# Patient Record
Sex: Female | Born: 1935 | Race: White | Hispanic: No | Marital: Married | Smoking: Never smoker
Health system: Southern US, Community
[De-identification: ages and names within clinical notes are randomized; demographics above are authoritative.]

## PROBLEM LIST (undated history)

## (undated) DIAGNOSIS — E875 Hyperkalemia: Secondary | ICD-10-CM

## (undated) DIAGNOSIS — K140 Glossitis: Secondary | ICD-10-CM

## (undated) DIAGNOSIS — M8580 Other specified disorders of bone density and structure, unspecified site: Secondary | ICD-10-CM

## (undated) DIAGNOSIS — G629 Polyneuropathy, unspecified: Secondary | ICD-10-CM

## (undated) DIAGNOSIS — E785 Hyperlipidemia, unspecified: Secondary | ICD-10-CM

## (undated) DIAGNOSIS — R609 Edema, unspecified: Secondary | ICD-10-CM

## (undated) DIAGNOSIS — Z0000 Encounter for general adult medical examination without abnormal findings: Secondary | ICD-10-CM

## (undated) DIAGNOSIS — D649 Anemia, unspecified: Secondary | ICD-10-CM

## (undated) DIAGNOSIS — M79605 Pain in left leg: Secondary | ICD-10-CM

## (undated) DIAGNOSIS — R8281 Pyuria: Secondary | ICD-10-CM

## (undated) DIAGNOSIS — M79673 Pain in unspecified foot: Secondary | ICD-10-CM

## (undated) DIAGNOSIS — E119 Type 2 diabetes mellitus without complications: Secondary | ICD-10-CM

## (undated) DIAGNOSIS — L309 Dermatitis, unspecified: Secondary | ICD-10-CM

## (undated) DIAGNOSIS — Z79899 Other long term (current) drug therapy: Secondary | ICD-10-CM

## (undated) DIAGNOSIS — K5730 Diverticulosis of large intestine without perforation or abscess without bleeding: Secondary | ICD-10-CM

## (undated) DIAGNOSIS — N189 Chronic kidney disease, unspecified: Secondary | ICD-10-CM

## (undated) DIAGNOSIS — I10 Essential (primary) hypertension: Secondary | ICD-10-CM

## (undated) DIAGNOSIS — D509 Iron deficiency anemia, unspecified: Secondary | ICD-10-CM

## (undated) DIAGNOSIS — N390 Urinary tract infection, site not specified: Secondary | ICD-10-CM

## (undated) DIAGNOSIS — K219 Gastro-esophageal reflux disease without esophagitis: Secondary | ICD-10-CM

## (undated) DIAGNOSIS — I2510 Atherosclerotic heart disease of native coronary artery without angina pectoris: Secondary | ICD-10-CM

## (undated) HISTORY — DX: Other long term (current) drug therapy: Z79.899

## (undated) HISTORY — DX: Pyuria: R82.81

## (undated) HISTORY — DX: Pain in unspecified foot: M79.673

## (undated) HISTORY — DX: Other specified disorders of bone density and structure, unspecified site: M85.80

## (undated) HISTORY — DX: Type 2 diabetes mellitus without complications: E11.9

## (undated) HISTORY — PX: PTCA: SHX146

## (undated) HISTORY — DX: Hyperlipidemia, unspecified: E78.5

## (undated) HISTORY — DX: Pain in left leg: M79.605

## (undated) HISTORY — DX: Chronic kidney disease, unspecified: N18.9

## (undated) HISTORY — DX: Anemia, unspecified: D64.9

## (undated) HISTORY — DX: Hyperkalemia: E87.5

## (undated) HISTORY — DX: Urinary tract infection, site not specified: N39.0

## (undated) HISTORY — DX: Atherosclerotic heart disease of native coronary artery without angina pectoris: I25.10

## (undated) HISTORY — DX: Polyneuropathy, unspecified: G62.9

## (undated) HISTORY — DX: Edema, unspecified: R60.9

## (undated) HISTORY — DX: Essential (primary) hypertension: I10

## (undated) HISTORY — DX: Encounter for general adult medical examination without abnormal findings: Z00.00

## (undated) HISTORY — DX: Glossitis: K14.0

## (undated) HISTORY — DX: Iron deficiency anemia, unspecified: D50.9

## (undated) HISTORY — DX: Dermatitis, unspecified: L30.9

## (undated) HISTORY — DX: Diverticulosis of large intestine without perforation or abscess without bleeding: K57.30

## (undated) HISTORY — DX: Gastro-esophageal reflux disease without esophagitis: K21.9

## (undated) HISTORY — PX: CHOLECYSTECTOMY: SHX55

---

## 1967-04-30 HISTORY — PX: TOTAL ABDOMINAL HYSTERECTOMY: SHX209

## 1999-05-13 ENCOUNTER — Emergency Department (HOSPITAL_COMMUNITY): Admission: EM | Admit: 1999-05-13 | Discharge: 1999-05-13 | Payer: Self-pay | Admitting: Emergency Medicine

## 2001-12-10 ENCOUNTER — Ambulatory Visit (HOSPITAL_COMMUNITY): Admission: RE | Admit: 2001-12-10 | Discharge: 2001-12-10 | Payer: Self-pay | Admitting: Internal Medicine

## 2003-10-18 ENCOUNTER — Emergency Department (HOSPITAL_COMMUNITY): Admission: EM | Admit: 2003-10-18 | Discharge: 2003-10-18 | Payer: Self-pay | Admitting: Emergency Medicine

## 2004-03-14 ENCOUNTER — Ambulatory Visit: Payer: Self-pay | Admitting: Internal Medicine

## 2004-06-12 ENCOUNTER — Ambulatory Visit: Payer: Self-pay | Admitting: Internal Medicine

## 2004-06-26 ENCOUNTER — Emergency Department (HOSPITAL_COMMUNITY): Admission: EM | Admit: 2004-06-26 | Discharge: 2004-06-26 | Payer: Self-pay | Admitting: Emergency Medicine

## 2004-06-28 ENCOUNTER — Ambulatory Visit: Payer: Self-pay | Admitting: Internal Medicine

## 2004-09-17 ENCOUNTER — Ambulatory Visit: Payer: Self-pay | Admitting: Internal Medicine

## 2004-09-21 ENCOUNTER — Ambulatory Visit: Payer: Self-pay

## 2004-11-07 ENCOUNTER — Ambulatory Visit: Payer: Self-pay | Admitting: Pulmonary Disease

## 2004-12-26 ENCOUNTER — Ambulatory Visit: Payer: Self-pay | Admitting: Internal Medicine

## 2005-01-04 ENCOUNTER — Ambulatory Visit: Payer: Self-pay | Admitting: Endocrinology

## 2005-02-22 ENCOUNTER — Ambulatory Visit: Payer: Self-pay | Admitting: Endocrinology

## 2005-03-25 ENCOUNTER — Ambulatory Visit: Payer: Self-pay | Admitting: Endocrinology

## 2005-04-01 ENCOUNTER — Observation Stay (HOSPITAL_COMMUNITY): Admission: EM | Admit: 2005-04-01 | Discharge: 2005-04-02 | Payer: Self-pay | Admitting: Emergency Medicine

## 2005-04-01 ENCOUNTER — Ambulatory Visit: Payer: Self-pay | Admitting: Endocrinology

## 2005-04-01 ENCOUNTER — Ambulatory Visit: Payer: Self-pay | Admitting: Cardiology

## 2005-04-08 ENCOUNTER — Ambulatory Visit: Payer: Self-pay | Admitting: Cardiology

## 2005-05-03 ENCOUNTER — Ambulatory Visit: Payer: Self-pay | Admitting: Internal Medicine

## 2005-05-10 ENCOUNTER — Ambulatory Visit: Payer: Self-pay | Admitting: Internal Medicine

## 2005-05-13 ENCOUNTER — Ambulatory Visit: Payer: Self-pay | Admitting: Cardiology

## 2005-05-13 ENCOUNTER — Inpatient Hospital Stay (HOSPITAL_COMMUNITY): Admission: EM | Admit: 2005-05-13 | Discharge: 2005-05-14 | Payer: Self-pay | Admitting: Emergency Medicine

## 2005-06-06 ENCOUNTER — Ambulatory Visit: Payer: Self-pay | Admitting: Internal Medicine

## 2005-06-27 ENCOUNTER — Ambulatory Visit: Payer: Self-pay | Admitting: Endocrinology

## 2005-07-16 ENCOUNTER — Ambulatory Visit: Payer: Self-pay | Admitting: Endocrinology

## 2005-08-01 ENCOUNTER — Ambulatory Visit: Payer: Self-pay | Admitting: Cardiology

## 2005-08-01 ENCOUNTER — Ambulatory Visit: Payer: Self-pay | Admitting: Endocrinology

## 2005-08-05 ENCOUNTER — Ambulatory Visit: Payer: Self-pay | Admitting: Internal Medicine

## 2005-08-19 ENCOUNTER — Ambulatory Visit: Payer: Self-pay | Admitting: Internal Medicine

## 2005-08-23 ENCOUNTER — Ambulatory Visit: Payer: Self-pay | Admitting: Internal Medicine

## 2005-09-27 ENCOUNTER — Ambulatory Visit: Payer: Self-pay | Admitting: Endocrinology

## 2005-10-02 ENCOUNTER — Ambulatory Visit: Payer: Self-pay | Admitting: Internal Medicine

## 2005-12-20 ENCOUNTER — Ambulatory Visit: Payer: Self-pay | Admitting: Endocrinology

## 2005-12-27 ENCOUNTER — Ambulatory Visit: Payer: Self-pay | Admitting: Endocrinology

## 2006-02-12 ENCOUNTER — Ambulatory Visit: Payer: Self-pay | Admitting: Internal Medicine

## 2006-02-24 ENCOUNTER — Ambulatory Visit: Payer: Self-pay

## 2006-02-24 HISTORY — PX: CARDIOVASCULAR STRESS TEST: SHX262

## 2006-02-28 ENCOUNTER — Ambulatory Visit: Payer: Self-pay | Admitting: Endocrinology

## 2006-03-24 ENCOUNTER — Ambulatory Visit: Payer: Self-pay | Admitting: Endocrinology

## 2006-03-24 LAB — CONVERTED CEMR LAB
Hgb A1c MFr Bld: 7.2 % — ABNORMAL HIGH (ref 4.6–6.0)
Iron: 67 ug/dL (ref 42–145)
MCV: 88.4 fL (ref 78.0–100.0)
RDW: 13.7 % (ref 11.5–14.6)
Saturation Ratios: 16.6 % — ABNORMAL LOW (ref 20.0–50.0)
WBC: 7.7 10*3/uL (ref 4.5–10.5)

## 2006-03-28 ENCOUNTER — Ambulatory Visit: Payer: Self-pay | Admitting: Endocrinology

## 2006-04-10 ENCOUNTER — Ambulatory Visit: Payer: Self-pay | Admitting: Gastroenterology

## 2006-04-15 ENCOUNTER — Encounter (INDEPENDENT_AMBULATORY_CARE_PROVIDER_SITE_OTHER): Payer: Self-pay | Admitting: Specialist

## 2006-04-15 ENCOUNTER — Ambulatory Visit: Payer: Self-pay | Admitting: Gastroenterology

## 2006-04-15 HISTORY — PX: ESOPHAGOGASTRODUODENOSCOPY: SHX1529

## 2006-04-21 ENCOUNTER — Ambulatory Visit: Payer: Self-pay | Admitting: Internal Medicine

## 2006-06-03 ENCOUNTER — Ambulatory Visit: Payer: Self-pay | Admitting: Gastroenterology

## 2006-06-10 ENCOUNTER — Ambulatory Visit: Payer: Self-pay | Admitting: Endocrinology

## 2006-06-10 LAB — CONVERTED CEMR LAB
Basophils Absolute: 0.1 10*3/uL (ref 0.0–0.1)
Hgb A1c MFr Bld: 7.5 % — ABNORMAL HIGH (ref 4.6–6.0)
Iron: 58 ug/dL (ref 42–145)
MCHC: 34.2 g/dL (ref 30.0–36.0)
Monocytes Relative: 10.8 % (ref 3.0–11.0)
RBC: 3.67 M/uL — ABNORMAL LOW (ref 3.87–5.11)
RDW: 13.3 % (ref 11.5–14.6)
Saturation Ratios: 16.7 % — ABNORMAL LOW (ref 20.0–50.0)

## 2006-06-16 ENCOUNTER — Ambulatory Visit: Payer: Self-pay | Admitting: Endocrinology

## 2006-06-18 ENCOUNTER — Ambulatory Visit: Payer: Self-pay | Admitting: Internal Medicine

## 2006-06-18 HISTORY — PX: ELECTROCARDIOGRAM: SHX264

## 2006-06-23 ENCOUNTER — Ambulatory Visit: Payer: Self-pay | Admitting: Internal Medicine

## 2006-06-23 LAB — CONVERTED CEMR LAB
ALT: 17 units/L (ref 0–40)
AST: 23 units/L (ref 0–37)
Alkaline Phosphatase: 55 units/L (ref 39–117)
Bilirubin, Direct: 0.1 mg/dL (ref 0.0–0.3)
CO2: 24 meq/L (ref 19–32)
Calcium: 9.2 mg/dL (ref 8.4–10.5)
Chloride: 109 meq/L (ref 96–112)
GFR calc non Af Amer: 47 mL/min
Glucose, Bld: 154 mg/dL — ABNORMAL HIGH (ref 70–99)
Total CHOL/HDL Ratio: 3

## 2006-09-10 ENCOUNTER — Ambulatory Visit: Payer: Self-pay | Admitting: Internal Medicine

## 2006-09-10 ENCOUNTER — Ambulatory Visit: Payer: Self-pay | Admitting: Endocrinology

## 2006-09-10 LAB — CONVERTED CEMR LAB
ALT: 18 units/L (ref 0–40)
Albumin: 3.5 g/dL (ref 3.5–5.2)
Alkaline Phosphatase: 59 units/L (ref 39–117)
BUN: 24 mg/dL — ABNORMAL HIGH (ref 6–23)
Basophils Absolute: 0.1 10*3/uL (ref 0.0–0.1)
CO2: 24 meq/L (ref 19–32)
Calcium: 9.4 mg/dL (ref 8.4–10.5)
Eosinophils Absolute: 0.4 10*3/uL (ref 0.0–0.6)
Eosinophils Relative: 7.5 % — ABNORMAL HIGH (ref 0.0–5.0)
GFR calc Af Amer: 57 mL/min
GFR calc non Af Amer: 47 mL/min
HCT: 32.4 % — ABNORMAL LOW (ref 36.0–46.0)
Hemoglobin: 11.1 g/dL — ABNORMAL LOW (ref 12.0–15.0)
Lymphocytes Relative: 35.1 % (ref 12.0–46.0)
MCHC: 34.3 g/dL (ref 30.0–36.0)
MCV: 89.8 fL (ref 78.0–100.0)
Monocytes Absolute: 0.7 10*3/uL (ref 0.2–0.7)
Neutro Abs: 2.6 10*3/uL (ref 1.4–7.7)
Neutrophils Relative %: 45.4 % (ref 43.0–77.0)
Total Protein: 6.6 g/dL (ref 6.0–8.3)
Triglycerides: 202 mg/dL (ref 0–149)
VLDL: 40 mg/dL (ref 0–40)
WBC: 5.9 10*3/uL (ref 4.5–10.5)

## 2006-09-15 ENCOUNTER — Ambulatory Visit: Payer: Self-pay | Admitting: Endocrinology

## 2006-09-19 ENCOUNTER — Emergency Department (HOSPITAL_COMMUNITY): Admission: EM | Admit: 2006-09-19 | Discharge: 2006-09-19 | Payer: Self-pay | Admitting: *Deleted

## 2006-09-23 ENCOUNTER — Ambulatory Visit: Payer: Self-pay | Admitting: Internal Medicine

## 2006-10-15 ENCOUNTER — Ambulatory Visit: Payer: Self-pay | Admitting: Endocrinology

## 2006-10-17 ENCOUNTER — Ambulatory Visit: Payer: Self-pay | Admitting: Endocrinology

## 2006-10-17 LAB — CONVERTED CEMR LAB
Bacteria, UA: NEGATIVE
Hemoglobin, Urine: NEGATIVE
Ketones, ur: NEGATIVE mg/dL
Leukocytes, UA: NEGATIVE
Mucus, UA: NEGATIVE
Total Protein, Urine: NEGATIVE mg/dL
pH: 6 (ref 5.0–8.0)

## 2006-11-10 ENCOUNTER — Ambulatory Visit: Payer: Self-pay | Admitting: *Deleted

## 2006-11-10 ENCOUNTER — Encounter: Payer: Self-pay | Admitting: Endocrinology

## 2006-11-10 ENCOUNTER — Ambulatory Visit: Payer: Self-pay | Admitting: Endocrinology

## 2006-11-10 ENCOUNTER — Ambulatory Visit (HOSPITAL_COMMUNITY): Admission: RE | Admit: 2006-11-10 | Discharge: 2006-11-10 | Payer: Self-pay | Admitting: Endocrinology

## 2006-11-21 ENCOUNTER — Encounter: Payer: Self-pay | Admitting: Endocrinology

## 2006-11-21 DIAGNOSIS — M949 Disorder of cartilage, unspecified: Secondary | ICD-10-CM

## 2006-11-21 DIAGNOSIS — I10 Essential (primary) hypertension: Secondary | ICD-10-CM | POA: Insufficient documentation

## 2006-11-21 DIAGNOSIS — K5730 Diverticulosis of large intestine without perforation or abscess without bleeding: Secondary | ICD-10-CM | POA: Insufficient documentation

## 2006-11-21 DIAGNOSIS — M899 Disorder of bone, unspecified: Secondary | ICD-10-CM | POA: Insufficient documentation

## 2006-11-21 DIAGNOSIS — E119 Type 2 diabetes mellitus without complications: Secondary | ICD-10-CM

## 2006-11-21 DIAGNOSIS — E785 Hyperlipidemia, unspecified: Secondary | ICD-10-CM

## 2006-11-21 DIAGNOSIS — I2510 Atherosclerotic heart disease of native coronary artery without angina pectoris: Secondary | ICD-10-CM | POA: Insufficient documentation

## 2006-12-15 ENCOUNTER — Ambulatory Visit: Payer: Self-pay | Admitting: Internal Medicine

## 2006-12-31 ENCOUNTER — Ambulatory Visit: Payer: Self-pay | Admitting: Endocrinology

## 2006-12-31 LAB — CONVERTED CEMR LAB
Alkaline Phosphatase: 57 units/L (ref 39–117)
BUN: 26 mg/dL — ABNORMAL HIGH (ref 6–23)
Basophils Absolute: 0 10*3/uL (ref 0.0–0.1)
CO2: 23 meq/L (ref 19–32)
Calcium: 8.9 mg/dL (ref 8.4–10.5)
Cholesterol: 129 mg/dL (ref 0–200)
Eosinophils Absolute: 0.4 10*3/uL (ref 0.0–0.6)
GFR calc Af Amer: 52 mL/min
GFR calc non Af Amer: 43 mL/min
HDL: 36.8 mg/dL — ABNORMAL LOW (ref >39.0)
Iron: 68 ug/dL (ref 42–145)
Ketones, ur: NEGATIVE mg/dL
Lymphocytes Relative: 28.6 % (ref 12.0–46.0)
MCV: 88.8 fL (ref 78.0–100.0)
Microalb Creat Ratio: 15.3 mg/g (ref 0.0–30.0)
Microalb, Ur: 0.9 mg/dL (ref 0.0–1.9)
Monocytes Relative: 10.4 % (ref 3.0–11.0)
Neutro Abs: 3.8 10*3/uL (ref 1.4–7.7)
Platelets: 265 10*3/uL (ref 150–400)
Potassium: 4.7 meq/L (ref 3.5–5.1)
RBC / HPF: NONE SEEN
RBC: 3.45 M/uL — ABNORMAL LOW (ref 3.87–5.11)
Saturation Ratios: 18.6 % — ABNORMAL LOW (ref 20.0–50.0)
Specific Gravity, Urine: 1.01 (ref 1.000–1.03)
Total Protein: 6.4 g/dL (ref 6.0–8.3)
Triglycerides: 278 mg/dL (ref 0–149)
Urine Glucose: NEGATIVE mg/dL
Urobilinogen, UA: 0.2 (ref 0.0–1.0)
WBC: 6.8 10*3/uL (ref 4.5–10.5)
pH: 6.5 (ref 5.0–8.0)

## 2007-01-05 ENCOUNTER — Ambulatory Visit: Payer: Self-pay | Admitting: Endocrinology

## 2007-01-05 ENCOUNTER — Encounter: Payer: Self-pay | Admitting: Gastroenterology

## 2007-02-05 ENCOUNTER — Ambulatory Visit: Payer: Self-pay | Admitting: Endocrinology

## 2007-04-20 ENCOUNTER — Ambulatory Visit: Payer: Self-pay | Admitting: Endocrinology

## 2007-04-20 DIAGNOSIS — M79609 Pain in unspecified limb: Secondary | ICD-10-CM

## 2007-04-21 LAB — CONVERTED CEMR LAB: Hgb A1c MFr Bld: 7.7 % — ABNORMAL HIGH (ref 4.6–6.0)

## 2007-06-22 ENCOUNTER — Ambulatory Visit: Payer: Self-pay | Admitting: Internal Medicine

## 2007-08-31 ENCOUNTER — Ambulatory Visit: Payer: Self-pay | Admitting: Endocrinology

## 2007-08-31 DIAGNOSIS — D509 Iron deficiency anemia, unspecified: Secondary | ICD-10-CM

## 2007-08-31 DIAGNOSIS — N390 Urinary tract infection, site not specified: Secondary | ICD-10-CM

## 2007-08-31 LAB — CONVERTED CEMR LAB
Basophils Relative: 1 % (ref 0.0–1.0)
Eosinophils Relative: 6 % — ABNORMAL HIGH (ref 0.0–5.0)
Hemoglobin: 10.5 g/dL — ABNORMAL LOW (ref 12.0–15.0)
Hgb A1c MFr Bld: 8 % — ABNORMAL HIGH (ref 4.6–6.0)
Ketones, urine, test strip: NEGATIVE
Lymphocytes Relative: 24.9 % (ref 12.0–46.0)
Monocytes Relative: 10.6 % (ref 3.0–12.0)
Neutro Abs: 5 10*3/uL (ref 1.4–7.7)
Protein, U semiquant: NEGATIVE
RBC: 3.43 M/uL — ABNORMAL LOW (ref 3.87–5.11)
Urobilinogen, UA: 0.2

## 2007-09-01 ENCOUNTER — Encounter: Payer: Self-pay | Admitting: Gastroenterology

## 2007-09-09 ENCOUNTER — Telehealth (INDEPENDENT_AMBULATORY_CARE_PROVIDER_SITE_OTHER): Payer: Self-pay | Admitting: *Deleted

## 2007-09-10 ENCOUNTER — Telehealth: Payer: Self-pay | Admitting: Internal Medicine

## 2007-09-10 ENCOUNTER — Encounter: Payer: Self-pay | Admitting: Internal Medicine

## 2007-10-26 ENCOUNTER — Encounter: Payer: Self-pay | Admitting: Endocrinology

## 2007-10-26 ENCOUNTER — Ambulatory Visit: Payer: Self-pay | Admitting: Internal Medicine

## 2007-11-30 ENCOUNTER — Ambulatory Visit: Payer: Self-pay | Admitting: Internal Medicine

## 2007-12-17 ENCOUNTER — Encounter: Admission: RE | Admit: 2007-12-17 | Discharge: 2008-01-20 | Payer: Self-pay | Admitting: Internal Medicine

## 2007-12-30 ENCOUNTER — Ambulatory Visit: Payer: Self-pay | Admitting: Endocrinology

## 2007-12-30 LAB — CONVERTED CEMR LAB
ALT: 16 units/L (ref 0–35)
AST: 19 units/L (ref 0–37)
Alkaline Phosphatase: 59 units/L (ref 39–117)
Basophils Absolute: 0.1 10*3/uL (ref 0.0–0.1)
Basophils Relative: 1.1 % (ref 0.0–3.0)
Bilirubin, Direct: 0.1 mg/dL (ref 0.0–0.3)
CO2: 25 meq/L (ref 19–32)
Chloride: 109 meq/L (ref 96–112)
Cholesterol: 120 mg/dL (ref 0–200)
Glucose, Bld: 194 mg/dL — ABNORMAL HIGH (ref 70–99)
HDL: 33.4 mg/dL — ABNORMAL LOW (ref >39.0)
Hemoglobin: 10.4 g/dL — ABNORMAL LOW (ref 12.0–15.0)
Ketones, ur: NEGATIVE mg/dL
LDL Cholesterol: 54 mg/dL (ref 0–99)
Lymphocytes Relative: 18.5 % (ref 12.0–46.0)
MCHC: 34.1 g/dL (ref 30.0–36.0)
Monocytes Relative: 8.8 % (ref 3.0–12.0)
Mucus, UA: NEGATIVE
Neutro Abs: 6.1 10*3/uL (ref 1.4–7.7)
Neutrophils Relative %: 56.2 % (ref 43.0–77.0)
Nitrite: POSITIVE — AB
Potassium: 4.7 meq/L (ref 3.5–5.1)
RBC: 3.34 M/uL — ABNORMAL LOW (ref 3.87–5.11)
RDW: 12.6 % (ref 11.5–14.6)
Sodium: 141 meq/L (ref 135–145)
Specific Gravity, Urine: 1.02 (ref 1.000–1.03)
Total Bilirubin: 0.5 mg/dL (ref 0.3–1.2)
Total CHOL/HDL Ratio: 3.6
Total Protein: 6.3 g/dL (ref 6.0–8.3)
Triglycerides: 162 mg/dL — ABNORMAL HIGH (ref 0–149)
Urobilinogen, UA: 0.2 (ref 0.0–1.0)
pH: 5.5 (ref 5.0–8.0)

## 2008-01-06 ENCOUNTER — Ambulatory Visit: Payer: Self-pay | Admitting: Endocrinology

## 2008-01-06 DIAGNOSIS — R82998 Other abnormal findings in urine: Secondary | ICD-10-CM

## 2008-01-06 DIAGNOSIS — N182 Chronic kidney disease, stage 2 (mild): Secondary | ICD-10-CM | POA: Insufficient documentation

## 2008-01-06 LAB — CONVERTED CEMR LAB
Creatinine,U: 66.6 mg/dL
Hgb A1c MFr Bld: 7.9 % — ABNORMAL HIGH (ref 4.6–6.0)
Iron: 55 ug/dL (ref 42–145)
Saturation Ratios: 14.6 % — ABNORMAL LOW (ref 20.0–50.0)
Transferrin: 269.8 mg/dL (ref >=212.0)

## 2008-02-02 ENCOUNTER — Ambulatory Visit: Payer: Self-pay | Admitting: Endocrinology

## 2008-02-15 ENCOUNTER — Encounter: Payer: Self-pay | Admitting: Endocrinology

## 2008-02-18 ENCOUNTER — Telehealth: Payer: Self-pay | Admitting: Endocrinology

## 2008-02-23 ENCOUNTER — Telehealth (INDEPENDENT_AMBULATORY_CARE_PROVIDER_SITE_OTHER): Payer: Self-pay | Admitting: *Deleted

## 2008-02-26 ENCOUNTER — Telehealth (INDEPENDENT_AMBULATORY_CARE_PROVIDER_SITE_OTHER): Payer: Self-pay | Admitting: *Deleted

## 2008-04-06 ENCOUNTER — Ambulatory Visit: Payer: Self-pay | Admitting: Endocrinology

## 2008-04-06 LAB — CONVERTED CEMR LAB
Eosinophils Absolute: 1.4 10*3/uL — ABNORMAL HIGH (ref 0.0–0.7)
Eosinophils Relative: 15.4 % — ABNORMAL HIGH (ref 0.0–5.0)
Hgb A1c MFr Bld: 7.8 % — ABNORMAL HIGH (ref 4.6–6.0)
Lymphocytes Relative: 24.6 % (ref 12.0–46.0)
MCV: 92.5 fL (ref 78.0–100.0)
Neutrophils Relative %: 50.3 % (ref 43.0–77.0)
Platelets: 271 10*3/uL (ref 150–400)
Saturation Ratios: 10.3 % — ABNORMAL LOW (ref 20.0–50.0)
Transferrin: 263.9 mg/dL (ref >=212.0)
WBC: 9 10*3/uL (ref 4.5–10.5)

## 2008-04-13 ENCOUNTER — Ambulatory Visit: Payer: Self-pay | Admitting: Endocrinology

## 2008-04-19 ENCOUNTER — Ambulatory Visit: Payer: Self-pay | Admitting: Internal Medicine

## 2008-04-19 DIAGNOSIS — K140 Glossitis: Secondary | ICD-10-CM | POA: Insufficient documentation

## 2008-05-16 ENCOUNTER — Encounter: Payer: Self-pay | Admitting: Endocrinology

## 2008-05-20 ENCOUNTER — Encounter: Payer: Self-pay | Admitting: Endocrinology

## 2008-06-30 ENCOUNTER — Encounter: Payer: Self-pay | Admitting: Internal Medicine

## 2008-06-30 ENCOUNTER — Ambulatory Visit: Payer: Self-pay | Admitting: Internal Medicine

## 2008-07-11 ENCOUNTER — Ambulatory Visit: Payer: Self-pay | Admitting: Internal Medicine

## 2008-07-11 ENCOUNTER — Ambulatory Visit: Payer: Self-pay | Admitting: Endocrinology

## 2008-07-11 ENCOUNTER — Telehealth: Payer: Self-pay | Admitting: Endocrinology

## 2008-07-11 LAB — CONVERTED CEMR LAB
BUN: 28 mg/dL — ABNORMAL HIGH (ref 6–23)
Creatinine, Ser: 1.3 mg/dL — ABNORMAL HIGH (ref 0.4–1.2)
GFR calc Af Amer: 52 mL/min
GFR calc non Af Amer: 43 mL/min
Potassium: 4.4 meq/L (ref 3.5–5.1)

## 2008-07-13 LAB — CONVERTED CEMR LAB
Hemoglobin: 11.4 g/dL — ABNORMAL LOW (ref 12.0–15.0)
Hgb A1c MFr Bld: 7.8 % — ABNORMAL HIGH (ref 4.6–6.0)
Iron: 71 ug/dL (ref 42–145)
Lymphocytes Relative: 28.5 % (ref 12.0–46.0)
Monocytes Relative: 9.5 % (ref 3.0–12.0)
Platelets: 258 10*3/uL (ref 150–400)
RDW: 12.4 % (ref 11.5–14.6)
Transferrin: 265.7 mg/dL (ref 212.0–360.0)

## 2008-07-18 ENCOUNTER — Ambulatory Visit: Payer: Self-pay | Admitting: Endocrinology

## 2008-09-06 ENCOUNTER — Telehealth: Payer: Self-pay | Admitting: Endocrinology

## 2008-09-12 LAB — HM PAP SMEAR: HM Pap smear: NORMAL

## 2008-11-27 ENCOUNTER — Emergency Department (HOSPITAL_COMMUNITY): Admission: EM | Admit: 2008-11-27 | Discharge: 2008-11-27 | Payer: Self-pay | Admitting: Emergency Medicine

## 2009-01-12 ENCOUNTER — Encounter (INDEPENDENT_AMBULATORY_CARE_PROVIDER_SITE_OTHER): Payer: Self-pay | Admitting: *Deleted

## 2009-01-16 ENCOUNTER — Ambulatory Visit: Payer: Self-pay | Admitting: Endocrinology

## 2009-01-16 LAB — CONVERTED CEMR LAB
Basophils Relative: 0.9 % (ref 0.0–3.0)
Bilirubin, Direct: 0.1 mg/dL (ref 0.0–0.3)
Calcium: 9.3 mg/dL (ref 8.4–10.5)
Cholesterol: 126 mg/dL (ref 0–200)
Direct LDL: 56.2 mg/dL
Eosinophils Absolute: 0.4 10*3/uL (ref 0.0–0.7)
GFR calc non Af Amer: 39.12 mL/min (ref >60)
HDL: 39.9 mg/dL (ref >39.00)
Hgb A1c MFr Bld: 7.7 % — ABNORMAL HIGH (ref 4.6–6.5)
MCHC: 33.6 g/dL (ref 30.0–36.0)
MCV: 92.3 fL (ref 78.0–100.0)
Monocytes Absolute: 0.6 10*3/uL (ref 0.1–1.0)
Neutrophils Relative %: 46.2 % (ref 43.0–77.0)
Platelets: 250 10*3/uL (ref 150.0–400.0)
RBC: 3.52 M/uL — ABNORMAL LOW (ref 3.87–5.11)
Sodium: 141 meq/L (ref 135–145)
Total Protein: 6.7 g/dL (ref 6.0–8.3)
Triglycerides: 359 mg/dL — ABNORMAL HIGH (ref 0.0–149.0)

## 2009-01-23 ENCOUNTER — Ambulatory Visit: Payer: Self-pay | Admitting: Endocrinology

## 2009-01-23 DIAGNOSIS — E875 Hyperkalemia: Secondary | ICD-10-CM

## 2009-01-24 ENCOUNTER — Telehealth: Payer: Self-pay | Admitting: Endocrinology

## 2009-02-17 ENCOUNTER — Ambulatory Visit: Payer: Self-pay | Admitting: Endocrinology

## 2009-02-17 LAB — CONVERTED CEMR LAB
ALT: 18 units/L (ref 0–35)
AST: 24 units/L (ref 0–37)
Albumin: 3.8 g/dL (ref 3.5–5.2)
Alkaline Phosphatase: 40 units/L (ref 39–117)
Bilirubin, Direct: 0.1 mg/dL (ref 0.0–0.3)
Cholesterol: 148 mg/dL (ref 0–200)
HDL: 43.3 mg/dL (ref >39.00)
LDL Cholesterol: 69 mg/dL (ref 0–99)
Total Bilirubin: 0.7 mg/dL (ref 0.3–1.2)
Total CHOL/HDL Ratio: 3
Total Protein: 6.4 g/dL (ref 6.0–8.3)
Triglycerides: 178 mg/dL — ABNORMAL HIGH (ref 0.0–149.0)
VLDL: 35.6 mg/dL (ref 0.0–40.0)

## 2009-02-22 ENCOUNTER — Ambulatory Visit: Payer: Self-pay | Admitting: Endocrinology

## 2009-02-22 LAB — CONVERTED CEMR LAB
BUN: 30 mg/dL — ABNORMAL HIGH (ref 6–23)
CO2: 27 meq/L (ref 19–32)
Calcium: 9.4 mg/dL (ref 8.4–10.5)
Chloride: 104 meq/L (ref 96–112)
Creatinine, Ser: 1.7 mg/dL — ABNORMAL HIGH (ref 0.4–1.2)
GFR calc non Af Amer: 31.26 mL/min (ref >60)
Glucose, Bld: 241 mg/dL — ABNORMAL HIGH (ref 70–99)
Potassium: 4.8 meq/L (ref 3.5–5.1)
Sodium: 141 meq/L (ref 135–145)

## 2009-03-31 DIAGNOSIS — D649 Anemia, unspecified: Secondary | ICD-10-CM

## 2009-03-31 DIAGNOSIS — R609 Edema, unspecified: Secondary | ICD-10-CM

## 2009-03-31 DIAGNOSIS — K219 Gastro-esophageal reflux disease without esophagitis: Secondary | ICD-10-CM

## 2009-04-03 ENCOUNTER — Ambulatory Visit: Payer: Self-pay | Admitting: Internal Medicine

## 2009-05-03 ENCOUNTER — Ambulatory Visit: Payer: Self-pay | Admitting: Internal Medicine

## 2009-05-03 ENCOUNTER — Encounter (INDEPENDENT_AMBULATORY_CARE_PROVIDER_SITE_OTHER): Payer: Self-pay | Admitting: *Deleted

## 2009-05-03 DIAGNOSIS — G589 Mononeuropathy, unspecified: Secondary | ICD-10-CM | POA: Insufficient documentation

## 2009-05-03 LAB — CONVERTED CEMR LAB
Albumin, U: DETECTED %
Alpha 2, Urine: DETECTED % — AB
Alpha-1-Globulin: 4.3 % (ref 2.9–4.9)
Alpha-2-Globulin: 12.3 % — ABNORMAL HIGH (ref 7.1–11.8)
BUN: 35 mg/dL — ABNORMAL HIGH (ref 6–23)
Basophils Absolute: 0.1 10*3/uL (ref 0.0–0.1)
Beta Globulin: 7 % (ref 4.7–7.2)
Beta, Urine: DETECTED % — AB
Chloride: 105 meq/L (ref 96–112)
Creatinine, Ser: 1.63 mg/dL — ABNORMAL HIGH (ref 0.40–1.20)
Eosinophils Absolute: 0.2 10*3/uL (ref 0.0–0.7)
Eosinophils Relative: 3 % (ref 0–5)
Gamma Globulin, Urine: DETECTED % — AB
Gamma Globulin: 11.7 % (ref 11.1–18.8)
Glucose, Bld: 190 mg/dL — ABNORMAL HIGH (ref 70–99)
HCT: 34.5 % — ABNORMAL LOW (ref 36.0–46.0)
Hemoglobin: 11.3 g/dL — ABNORMAL LOW (ref 12.0–15.0)
Hgb A1c MFr Bld: 8.2 % — ABNORMAL HIGH (ref 4.6–6.1)
MCV: 90.1 fL (ref 78.0–100.0)
Monocytes Absolute: 0.6 10*3/uL (ref 0.1–1.0)
Platelets: 380 10*3/uL (ref 150–400)
RDW: 13 % (ref 11.5–15.5)

## 2009-05-04 ENCOUNTER — Encounter: Payer: Self-pay | Admitting: Internal Medicine

## 2009-05-05 ENCOUNTER — Telehealth: Payer: Self-pay | Admitting: Internal Medicine

## 2009-05-12 ENCOUNTER — Ambulatory Visit: Payer: Self-pay | Admitting: Diagnostic Radiology

## 2009-05-12 ENCOUNTER — Ambulatory Visit (HOSPITAL_BASED_OUTPATIENT_CLINIC_OR_DEPARTMENT_OTHER): Admission: RE | Admit: 2009-05-12 | Discharge: 2009-05-12 | Payer: Self-pay | Admitting: Internal Medicine

## 2009-05-12 ENCOUNTER — Ambulatory Visit: Payer: Self-pay | Admitting: Internal Medicine

## 2009-05-12 ENCOUNTER — Telehealth: Payer: Self-pay | Admitting: Internal Medicine

## 2009-05-12 DIAGNOSIS — E538 Deficiency of other specified B group vitamins: Secondary | ICD-10-CM

## 2009-05-15 ENCOUNTER — Encounter: Payer: Self-pay | Admitting: Internal Medicine

## 2009-05-18 ENCOUNTER — Telehealth: Payer: Self-pay | Admitting: Internal Medicine

## 2009-05-22 ENCOUNTER — Ambulatory Visit: Payer: Self-pay | Admitting: Internal Medicine

## 2009-05-29 ENCOUNTER — Encounter: Payer: Self-pay | Admitting: Internal Medicine

## 2009-06-05 ENCOUNTER — Encounter: Payer: Self-pay | Admitting: Internal Medicine

## 2009-06-07 ENCOUNTER — Telehealth: Payer: Self-pay | Admitting: Internal Medicine

## 2009-06-09 ENCOUNTER — Encounter: Payer: Self-pay | Admitting: Internal Medicine

## 2009-06-19 ENCOUNTER — Ambulatory Visit: Payer: Self-pay | Admitting: Internal Medicine

## 2009-08-07 ENCOUNTER — Encounter: Payer: Self-pay | Admitting: Internal Medicine

## 2009-09-29 ENCOUNTER — Ambulatory Visit: Payer: Self-pay | Admitting: Internal Medicine

## 2009-09-29 DIAGNOSIS — L259 Unspecified contact dermatitis, unspecified cause: Secondary | ICD-10-CM | POA: Insufficient documentation

## 2009-09-29 LAB — CONVERTED CEMR LAB
BUN: 36 mg/dL — ABNORMAL HIGH (ref 6–23)
CO2: 23 meq/L (ref 19–32)
Calcium: 10.1 mg/dL (ref 8.4–10.5)
Chloride: 107 meq/L (ref 96–112)
Creatinine, Ser: 1.89 mg/dL — ABNORMAL HIGH (ref 0.40–1.20)
Glucose, Bld: 156 mg/dL — ABNORMAL HIGH (ref 70–99)

## 2009-10-02 ENCOUNTER — Telehealth: Payer: Self-pay | Admitting: Internal Medicine

## 2009-10-27 LAB — HM DIABETES EYE EXAM

## 2009-11-06 ENCOUNTER — Encounter: Payer: Self-pay | Admitting: Internal Medicine

## 2009-11-17 ENCOUNTER — Encounter: Payer: Self-pay | Admitting: Internal Medicine

## 2010-01-05 ENCOUNTER — Ambulatory Visit: Payer: Self-pay | Admitting: Internal Medicine

## 2010-01-05 LAB — CONVERTED CEMR LAB
BUN: 33 mg/dL — ABNORMAL HIGH (ref 6–23)
CO2: 24 meq/L (ref 19–32)
Calcium: 10.3 mg/dL (ref 8.4–10.5)
Creatinine, Ser: 1.75 mg/dL — ABNORMAL HIGH (ref 0.40–1.20)
Glucose, Bld: 163 mg/dL — ABNORMAL HIGH (ref 70–99)
Hgb A1c MFr Bld: 8.1 % — ABNORMAL HIGH (ref <5.7)
Sodium: 141 meq/L (ref 135–145)

## 2010-01-08 ENCOUNTER — Telehealth: Payer: Self-pay | Admitting: Internal Medicine

## 2010-01-17 ENCOUNTER — Telehealth: Payer: Self-pay | Admitting: Internal Medicine

## 2010-01-26 ENCOUNTER — Encounter: Payer: Self-pay | Admitting: Internal Medicine

## 2010-01-26 ENCOUNTER — Ambulatory Visit: Payer: Self-pay | Admitting: Internal Medicine

## 2010-01-30 ENCOUNTER — Telehealth (INDEPENDENT_AMBULATORY_CARE_PROVIDER_SITE_OTHER): Payer: Self-pay

## 2010-01-31 ENCOUNTER — Ambulatory Visit: Payer: Self-pay | Admitting: Cardiovascular Disease

## 2010-01-31 ENCOUNTER — Encounter: Payer: Self-pay | Admitting: Cardiovascular Disease

## 2010-01-31 ENCOUNTER — Encounter (HOSPITAL_COMMUNITY): Admission: RE | Admit: 2010-01-31 | Discharge: 2010-02-12 | Payer: Self-pay | Admitting: Internal Medicine

## 2010-01-31 ENCOUNTER — Ambulatory Visit: Payer: Self-pay

## 2010-02-01 ENCOUNTER — Encounter: Payer: Self-pay | Admitting: Internal Medicine

## 2010-02-07 ENCOUNTER — Encounter: Payer: Self-pay | Admitting: Internal Medicine

## 2010-02-07 LAB — CONVERTED CEMR LAB
AST: 17 units/L (ref 0–37)
Albumin: 4.4 g/dL (ref 3.5–5.2)
Alkaline Phosphatase: 40 units/L (ref 39–117)
BUN: 33 mg/dL — ABNORMAL HIGH (ref 6–23)
Calcium: 9.9 mg/dL (ref 8.4–10.5)
Chloride: 108 meq/L (ref 96–112)
Cholesterol: 163 mg/dL (ref 0–200)
Creatinine, Ser: 1.69 mg/dL — ABNORMAL HIGH (ref 0.40–1.20)
HDL: 47 mg/dL (ref >39)
Indirect Bilirubin: 0.4 mg/dL (ref 0.0–0.9)
LDL Cholesterol: 78 mg/dL (ref 0–99)
Total Protein: 6.8 g/dL (ref 6.0–8.3)
Triglycerides: 189 mg/dL — ABNORMAL HIGH (ref <150)

## 2010-02-08 ENCOUNTER — Encounter: Payer: Self-pay | Admitting: Internal Medicine

## 2010-02-12 ENCOUNTER — Ambulatory Visit: Payer: Self-pay | Admitting: Internal Medicine

## 2010-02-12 ENCOUNTER — Encounter: Payer: Self-pay | Admitting: Internal Medicine

## 2010-02-13 LAB — CONVERTED CEMR LAB
AST: 18 units/L (ref 0–37)
Albumin: 4.3 g/dL (ref 3.5–5.2)
Alkaline Phosphatase: 40 units/L (ref 39–117)
Bilirubin, Direct: 0.1 mg/dL (ref 0.0–0.3)
HDL: 48 mg/dL (ref >39)
LDL Cholesterol: 78 mg/dL (ref 0–99)
Total Bilirubin: 0.5 mg/dL (ref 0.3–1.2)

## 2010-03-05 ENCOUNTER — Encounter: Payer: Self-pay | Admitting: Internal Medicine

## 2010-03-12 ENCOUNTER — Ambulatory Visit: Payer: Self-pay | Admitting: Internal Medicine

## 2010-05-24 ENCOUNTER — Ambulatory Visit: Admit: 2010-05-24 | Payer: Self-pay | Admitting: Internal Medicine

## 2010-05-29 NOTE — Consult Note (Signed)
Summary: [facility]   Imported By: Lanelle Bal 06/01/2009 11:41:59  _____________________________________________________________________  External Attachment:    Type:   Image     Comment:   External Document

## 2010-05-29 NOTE — Assessment & Plan Note (Signed)
Summary: 3 month follow up/mhf   Vital Signs:  Patient profile:   77 year old female Height:      67 inches Weight:      179.50 pounds BMI:     28.22 O2 Sat:      100 % on Room air Temp:     97.6 degrees F oral Pulse rate:   65 / minute Pulse rhythm:   regular Resp:     18 per minute BP sitting:   118 / 60  (right arm) Cuff size:   large  Vitals Entered By: Glendell Docker CMA (September 29, 2009 9:49 AM)  O2 Flow:  Room air CC: Rm 2- 3 Month Follow up, Type 2 diabetes mellitus follow-up   Primary Care Provider:  DThomos Lemons DO  CC:  Rm 2- 3 Month Follow up and Type 2 diabetes mellitus follow-up.  History of Present Illness:  Type 2 Diabetes Mellitus Follow-Up      This is a 77 year old woman who presents for Type 2 diabetes mellitus follow-up.  The patient denies self managed hypoglycemia and hypoglycemia requiring help.  The patient denies the following symptoms: chest pain.  Since the last visit the patient reports good dietary compliance, compliance with medications, and monitoring blood glucose.  low blood suagr 103 high 160 avg 120-130's  bilateral rash on upper legs onset Monday  Preventive Screening-Counseling & Management  Alcohol-Tobacco     Smoking Status: never  Allergies: 1)  ! Sulfa 2)  ! * Actos  Past History:  Past Medical History: 1.HYPERTENSION  2.HYPERLIPIDEMIA  3 1. Coronary artery disease status Taxus drug-eluding stent to the LAD December 2006.     a.     Re-catheterization January 2007 for recurrent chest pain. Showed patent LAD stent, normal LV function. 4. GERD    5. ANEMIA   6.EDEMA 7.ENCOUNTER FOR LONG-TERM USE OF OTHER MEDICATIONS 8.HYPERKALEMIA 9. GLOSSITIS 10. LEG PAIN, LEFT  11.ROUTINE GENERAL MEDICAL EXAM@[email or web address]  CARE FACL  12.RENAL DISEASE, CHRONIC, MILD  13.PYURIA 14.ANEMIA, IRON DEFICIENCY  15.UTI  16. FOOT PAIN, BILATERAL 17.OSTEOPENIA 18.DIVERTICULOSIS, COLON  19.DIABETES MELLITUS, TYPE II  20.Coronary artery disease  s/p LAD stent 21.Lower Extremity edema with normal BNP 23 - Polyneuropathy    Past Surgical History: Cholecystectomy Hysterectomy 1969 (no bs0) PTCA/stent   Taxus drug-eluting stent  to the LAD in December 2006, recatheterization in January 2007 for  recurrent chest pain that showed a patent LAD stent with normal LV function. 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   Lungs:  normal respiratory effort and normal breath sounds.   Heart:  normal rate, regular rhythm, and no gallop.   Extremities:  bilateral puffy ankles but no pitting edema  Skin:  scatterd macular rash over lower legs Psych:  normally interactive and good eye contact.     Impression & Recommendations:  Problem # 1:  DIABETES MELLITUS, TYPE II (ICD-250.00) Assessment Improved good response to lantus.   Maintain current medication regimen.  Her updated medication list for this problem includes:    Adult Aspirin Low Strength 81 Mg Tbdp (Aspirin) .Marland Kitchen... Take 1 by  mouth qd    Glyburide 5 Mg Tabs (Glyburide) .Marland Kitchen... Take 1 tablet by mouth once a day    Januvia 100 Mg Tabs (Sitagliptin phosphate) ..... One by mouth once daily    Lantus Solostar 100 Unit/ml Soln (Insulin glargine) .Marland Kitchen... 5-10 units once daily    Lisinopril-hydrochlorothiazide 10-12.5 Mg Tabs (Lisinopril-hydrochlorothiazide) ..... One half tab by mouth once daily  Orders: T- Hemoglobin A1C ([id number])  Labs Reviewed: Creat: 1.63 (05/03/2009)    Reviewed HgBA1c results: 8.2 (05/03/2009)  7.7 (01/16/2009)  Problem # 2:  HYPERTENSION (ICD-401.9) stable.  Maintain current medication regimen.  Her updated medication list for  this problem includes:    Carvedilol 6.25 Mg Tabs (Carvedilol) ..... One by mouth two times a day    Lisinopril-hydrochlorothiazide 10-12.5 Mg Tabs (Lisinopril-hydrochlorothiazide) ..... One half tab by mouth once daily  Orders: T-Basic Metabolic Panel [id number])  BP today: 118/60 Prior BP: 110/60 (06/19/2009)  Labs Reviewed: K+: 5.4 (05/03/2009) Creat: : 1.63 (05/03/2009)   Chol: 148 (02/17/2009)   HDL: 43.30 (02/17/2009)   LDL: 69 (02/17/2009)   TG: 178.0 (02/17/2009)  Problem # 3:  VITAMIN B12 DEFICIENCY (ICD-266.2) monitor b12 levels Orders: T-Vitamin B12 ([id number]) T- * Misc. Laboratory test [phone]) Vit B12 1000 mcg (J3420) Admin of Therapeutic Inj  intramuscular or subcutaneous ([address])  Problem # 4:  DERMATITIS (ICD-692.9) probable allergic dermatitis of lower legs.  use topical agent as directed.  Patient advised to call office if symptoms persist or worsen.  Her updated medication list for this problem includes:    Triamcinolone Acetonide 0.1 % Crea (Triamcinolone acetonide) .Marland Kitchen... Apply two times a day x 1 week  Complete Medication List: 1)  Plavix 75 Mg Tabs (Clopidogrel bisulfate) .... Take 1 by mouth qd 2)  Lovastatin 40 Mg Tabs (Lovastatin) .... Take 1 by mouth at bedtime 3)  Adult Aspirin Low Strength 81 Mg Tbdp (Aspirin) .... Take 1 by mouth qd 4)  Multivitamins Tabs (Multiple vitamin) .... Take 1 by mouth qd 5)  Prilosec 20 Mg Cpdr (Omeprazole) .... Take 1 by mouth as needed 6)  Nitrostat 0.4 Mg Subl (Nitroglycerin) .... Take prn 7)  Carvedilol 6.25 Mg Tabs (Carvedilol) .... One by mouth two times a day 8)  Fenofibrate 54 Mg Tabs (Fenofibrate) .... Take 1 tablet by mouth once a day 9)  Glyburide 5 Mg Tabs (Glyburide) .... Take 1 tablet by mouth once a day 10)  Januvia 100 Mg Tabs (Sitagliptin phosphate) .... One by mouth once daily 11)  Lantus Solostar 100 Unit/ml Soln (Insulin glargine) .... 5-10 units once daily 12)  Relion Pen Needles 31g X 8 Mm Misc  (Insulin pen needle) .... Use once daily 13)  Lisinopril-hydrochlorothiazide 10-12.5 Mg Tabs (Lisinopril-hydrochlorothiazide) .... One half tab by mouth once daily 14)  Freestyle Lite Test Strp (Glucose blood) .... Use to check blood sugar two times a day ( diabetes 250.00) 15)  Triamcinolone Acetonide 0.1 % Crea (Triamcinolone acetonide) .... Apply two times a day x 1 week 16)  Freestyle Lancets Misc (Lancets) .... Use to test blood sugar two times a day ( diabetes 250.00) 17)  Freestyle Freedom Lite W/device Kit (Blood glucose monitoring suppl) i  Patient Instructions: 1)  Please schedule a follow-up appointment in 6 months. Prescriptions: TRIAMCINOLONE ACETONIDE 0.1 % CREA (TRIAMCINOLONE ACETONIDE) apply two times a day x 1 week  #30 grams x 1   Entered and Authorized by:   D. [name] DO   Signed by:   D. [name] DO on  09/29/2009   Method used:   Electronically to        [facility] [address] (retail)       [phone]       [place], [place]  [address]       Ph: [phone]       Fax: [phone]   RxID:   [id number] BAYER CONTOUR TEST  STRP (GLUCOSE BLOOD) use as directed once daily  #100 x 3   Entered and Authorized by:   D. [name] DO   Signed by:   D. [name] DO on 09/29/2009   Method used:   Electronically to        [facility] [address] (retail)       [phone]       [place], [place]  [address]       Ph: [phone]       Fax: [phone]   RxID:   [id number] LANTUS SOLOSTAR 100 UNIT/ML SOLN (INSULIN GLARGINE) 5-10 units once daily  #1 month x 5   Entered and Authorized by:   D. [name] DO   Signed by:   D. [name] DO on 09/29/2009   Method used:   Electronically to        [facility] [address] [place] (retail)       [phone]       [place], [place]  [address]       Ph: [phone]       Fax: [phone]   RxID:    [id number] JANUVIA 100 MG TABS (SITAGLIPTIN PHOSPHATE) one by mouth once daily  #30 x 5   Entered and Authorized by:   D. [name] DO   Signed by:   D. [name] DO on 09/29/2009   Method used:   Electronically to        [facility] [address] (retail)       [phone]       [place], [place]  [address]       Ph: [phone]       Fax: [phone]   RxID:   [id number] GLYBURIDE 5 MG TABS (GLYBURIDE) Take 1 tablet by mouth once a day  #30 x 5   Entered and Authorized by:   D. [name] DO   Signed by:   D. [name] DO on 09/29/2009   Method used:   Electronically to        [facility] [address] [place] (retail)       [phone]       [place], [place]  [address]       Ph: [phone]       Fax: [phone]   RxID:   [id number] LOVASTATIN 40 MG  TABS (LOVASTATIN) take 1 by mouth at bedtime  #30 x 5   Entered and Authorized by:   D. [name] DO   Signed by:   D. [name] DO on 09/29/2009   Method used:   Electronically to        [facility] [address] [place] (retail)       [phone]       [place], [place]  [address]  Ph: [phone]       Fax: [phone]   RxID:   [id number]   Current Allergies (reviewed today): ! SULFA ! * ACTOS   Medication Administration  Injection # 1:    Medication: Vit B12 1000 mcg    Diagnosis: VITAMIN B12 DEFICIENCY (ICD-266.2)    Route: IM    Site: R deltoid    Exp Date: 04/29/2011    Lot #: [id number]    Mfr: American Regent    Patient tolerated injection without complications    Given by: Glendell Docker CMA (September 29, 2009 10:51 AM)  Orders Added: 1)  T-Vitamin B12 [82607-23330] 2)  T- * Misc. Laboratory test [99999] 3)  T-Basic Metabolic Panel [80048-22910] 4)  T- Hemoglobin A1C [83036-23375] 5)  Vit B12 1000 mcg [J3420] 6)  Admin of Therapeutic Inj  intramuscular or subcutaneous [96372] 7)  Est. Patient  Level III [28413]

## 2010-05-29 NOTE — Letter (Signed)
   [place] at [facility] [address] [place], [place]  [address]  [place] Phone: [phone]      February 08, 2010   [facility] [address] [place], [place] [address]  RE:  LAB RESULTS  Dear  Ms. Dutt,  The following is an interpretation of your most recent lab tests.  Please take note of any instructions provided or changes to medications that have resulted from your lab work.  ELECTROLYTES:  Good - no changes needed  KIDNEY FUNCTION TESTS:  Good - no changes needed  LIPID PANEL:  Stable - no changes needed Triglyceride: 189   Cholesterol: 163   LDL: 78   HDL: 47   Chol/HDL%:  3.5 Ratio   DIABETIC STUDIES:  Poor - schedule a follow-up appointment soon Blood Glucose: 179   HgbA1C: 8.1   Microalbumin/Creatinine Ratio: 9.4      Please call my office to make earlier follow up appointment (within 1 month).     Sincerely Yours,    Dr. [name]  Appended Document:  mailed

## 2010-05-29 NOTE — Progress Notes (Signed)
Summary: needs a new diabetes test meter  Phone Note Call from Patient Call back at Home Phone [phone]   Caller: patient  Call For: [name]  Summary of Call: She needs a new diabetes testing meter.   Initial call taken by: Roselle Locus,  October 02, 2009 1:37 PM  Follow-up for Phone Call        call placed to patient, she was advised that she may pick up glucose meter at front desk Follow-up by: Glendell Docker CMA,  October 02, 2009 2:20 PM    New/Updated Medications: FREESTYLE LITE TEST  STRP (GLUCOSE BLOOD) use to check blood sugar two times a day ( Diabetes 250.00) FREESTYLE LANCETS  MISC (LANCETS) use to test blood sugar two times a day ( Diabetes 250.00) FREESTYLE FREEDOM LITE W/DEVICE KIT (BLOOD GLUCOSE MONITORING SUPPL)  Prescriptions: FREESTYLE LANCETS  MISC (LANCETS) use to test blood sugar two times a day ( Diabetes 250.00)  #180 x 2   Entered by:   Glendell Docker CMA   Authorized by:   D. [name] DO   Signed by:   Glendell Docker CMA on 10/02/2009   Method used:   Electronically to        [facility] [address] (retail)       [phone]       [place], [place]  [address]       Ph: [phone]       Fax: [phone]   RxID:   [id number] FREESTYLE LITE TEST  STRP (GLUCOSE BLOOD) use to check blood sugar two times a day ( Diabetes 250.00)  #180 x 2   Entered by:   Glendell Docker CMA   Authorized by:   D. [name] DO   Signed by:   Glendell Docker CMA on 10/02/2009   Method used:   Electronically to        [facility] [address] (retail)       [phone]       [place], [place]  [address]       Ph: [phone]       Fax: [phone]   RxID:   [id number]

## 2010-05-29 NOTE — Assessment & Plan Note (Signed)
Summary: 1 WEEK FOLLOW UP/DK rsc with pt/mhf   Vital Signs:  Patient profile:   77 year old female Weight:      190 pounds BMI:     29.87 O2 Sat:      100 % on Room air Temp:     98.2 degrees F oral Pulse rate:   77 / minute Pulse rhythm:   regular Resp:     18 per minute BP sitting:   120 / 72  (right arm) Cuff size:   large  Vitals Entered By: Glendell Docker CMA (May 12, 2009 2:41 PM)  O2 Flow:  Room air  Primary Care Provider:  D. [name] DO  CC:  1 Week Followup .  History of Present Illness: 77 y/o for follow up.   metformin DC'ed due to increase in Cr. she denies use of NSAIDs.  no vomiting or diarrhea she notes low bp in am.  she take coreg 12.5 mg at night  neuropathy -  SPEP / UPEP - negative.  B12 levels low    Allergies: 1)  ! Sulfa 2)  ! * Actos  Past History:  Past Medical History: 1.HYPERTENSION  2.HYPERLIPIDEMIA  3 1. Coronary artery disease status Taxus drug-eluding stent to the LAD December 2006.     a.     Re-catheterization January 2007 for recurrent chest pain. Showed patent LAD stent, normal LV function. 4. GERD   5. ANEMIA  6.EDEMA 7.ENCOUNTER FOR LONG-TERM USE OF OTHER MEDICATIONS 8.HYPERKALEMIA 9. GLOSSITIS 10. LEG PAIN, LEFT  11.ROUTINE GENERAL MEDICAL EXAM@HEALTH  CARE FACL  12.RENAL DISEASE, CHRONIC, MILD  13.PYURIA 14.ANEMIA, IRON DEFICIENCY  15.UTI  16. FOOT PAIN, BILATERAL 17.OSTEOPENIA 18.DIVERTICULOSIS, COLON  19.DIABETES MELLITUS, TYPE II  20.Coronary artery disease s/p LAD stent 21.Lower Extremity edema with normal BNP 23 - Polyneuropathy  Past Surgical History: Cholecystectomy Hysterectomy 1969 (no bs0) PTCA/stent   Taxus drug-eluting stent  to the LAD in December 2006, recatheterization in January 2007 for  recurrent chest pain that showed a patent LAD stent with normal LV function. EGD (04/15/2006) Stress Cardiolite (02/24/2006) EKG (06/18/2006)   Irrigation and debridement, open reduction, and  fixation  using a vertical mattress 3-0 nylon suture.   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   Neck:  No deformities, masses, or tenderness noted.no carotid bruits.   Lungs:  normal respiratory effort and normal breath sounds.   Heart:  Regular rate and rhythm without murmurs or gallops noted. Normal S1,S2.   Extremities:  bilateral puffy ankles   Impression & Recommendations:  Problem # 1:  DIABETES MELLITUS, TYPE II (ICD-250.00) Metformin discontinued.  add [place].  If CBGs > 300, we discussed starting insulin therapy  Her updated medication list for this problem includes:    Adult Aspirin Low Strength 81 Mg Tbdp (Aspirin) .Marland Kitchen... Take 1 by mouth qd    Lisinopril 10 Mg Tabs (Lisinopril) ..... One by mouth once daily    Glyburide 5 Mg Tabs (Glyburide) .Marland Kitchen... Take 1 tablet by mouth once a day    Januvia 100 Mg Tabs (Sitagliptin phosphate) ..... One by mouth once daily  Problem # 2:  RENAL DISEASE, CHRONIC, MILD (ICD-585.2) She denies use of NSAIDs.  she takes occasional tylenol.  check renal u/s.  she report lower BP  in AM .   reduce of lisinopril.  do not take coreg at bedtime.  split coreg dose to two times a day.  Orders: Ultrasound (Ultrasound)  Problem # 3:  HYPERTENSION (ICD-401.9) Pt having low BP in AM.  She was taking single dose of coreg at bedtime.  split coreg dose to two times a day.  reduce lisinopril dose Her updated medication list for this problem includes:    Hydrochlorothiazide 25 Mg Tabs (Hydrochlorothiazide) .Marland Kitchen... Take 1 by mouth qd    Carvedilol 6.25 Mg Tabs (Carvedilol) ..... One by mouth two times a day    Lisinopril 10 Mg Tabs (Lisinopril) ..... One by mouth once daily  BP today: 120/72 Prior BP: 110/60 (05/03/2009)  Labs Reviewed: K+: 5.4  (05/03/2009) Creat: : 1.63 (05/03/2009)   Chol: 148 (02/17/2009)   HDL: 43.30 (02/17/2009)   LDL: 69 (02/17/2009)   TG: 178.0 (02/17/2009)  Problem # 4:  VITAMIN B12 DEFICIENCY (ICD-266.2)  I doubt this is cause of her long standing neuropathy.  check intrinsic factor and parietal cell antigody.  B12 shot today.    Orders: Vit B12 1000 mcg (J3420) Admin of Therapeutic Inj  intramuscular or subcutaneous ([address])  Complete Medication List: 1)  Plavix 75 Mg Tabs (Clopidogrel bisulfate) .... Take 1 by mouth qd 2)  Lovastatin 40 Mg Tabs (Lovastatin) .... Take 1 by mouth at bedtime 3)  Adult Aspirin Low Strength 81 Mg Tbdp (Aspirin) .... Take 1 by mouth qd 4)  Multivitamins Tabs (Multiple vitamin) .... Take 1 by mouth qd 5)  Prilosec 20 Mg Cpdr (Omeprazole) .... Take 1 by mouth as needed 6)  Hydrochlorothiazide 25 Mg Tabs (Hydrochlorothiazide) .... Take 1 by mouth qd 7)  Nitrostat 0.4 Mg Subl (Nitroglycerin) .... Take prn 8)  Carvedilol 6.25 Mg Tabs (Carvedilol) .... One by mouth two times a day 9)  Lisinopril 10 Mg Tabs (Lisinopril) .... One by mouth once daily 10)  Fenofibrate 54 Mg Tabs (Fenofibrate) .... Take 1 tablet by mouth once a day 11)  Glyburide 5 Mg Tabs (Glyburide) .... Take 1 tablet by mouth once a day 12)  Januvia 100 Mg Tabs (Sitagliptin phosphate) .... One by mouth once daily   Patient Instructions: 1)  Please schedule a follow-up appointment in 1 month. 2)  BMP prior to visit, ICD-9: 585.2 3)  Anti parietal cell antibody: 266.2 4)  Anti intrinsic factor antibody: 266.2 5)  Please return for lab work one (1) week before your next appointment.  Prescriptions: JANUVIA 100 MG TABS (SITAGLIPTIN PHOSPHATE) one by mouth once daily  #30 x 1   Entered and Authorized by:   D. [name] DO   Signed by:   D. [name] DO on 05/12/2009   Method used:   Electronically to        [facility] [address] [place] (retail)       [phone]       [place], [place]  [address]       Ph: [phone]       Fax: [phone]   RxID:   [id number] LISINOPRIL 10 MG TABS (LISINOPRIL) one by mouth once daily  #90 x 1   Entered and Authorized by:   D. [name] DO   Signed by:   D. [name] DO on 05/12/2009   Method used:   Electronically to        [facility] [address] [place] (retail)  [address]       [place], [place]  [address]       Ph: [phone]       Fax: [phone]   RxID:   [id number] CARVEDILOL 6.25 MG TABS (CARVEDILOL) one by mouth two times a day  #180 x 1   Entered and Authorized by:   D. [name] DO   Signed by:   D. [name] DO on 05/12/2009   Method used:   Electronically to        [facility] [address] [place] (retail)       [phone]       [place], [place]  [address]       Ph: [phone]       Fax: [phone]   RxID:   [id number]   Current Allergies (reviewed today): ! SULFA ! * ACTOS       Medication Administration  Injection # 1:    Medication: Vit B12 1000 mcg    Diagnosis: VITAMIN B12 DEFICIENCY (ICD-266.2)    Route: IM    Site: R deltoid    Exp Date: 12/28/2010    Lot #: [id number]    Mfr: [employer] Regent    Patient tolerated injection without complications    Given by: Glendell Docker CMA (May 12, 2009 4:25 PM)  Orders Added: 1)  Ultrasound [Ultrasound] 2)  Vit B12 1000 mcg [J3420] 3)  Admin of Therapeutic Inj  intramuscular or subcutaneous [96372] 4)  Est. Patient Level III [106[address]]

## 2010-05-29 NOTE — Assessment & Plan Note (Signed)
Summary: 1 month follow up/mhf   Vital Signs:  Patient profile:   77 year old female Height:      67 inches Weight:      183.50 pounds BMI:     28.84 O2 Sat:      98 % on Room air Temp:     98.2 degrees F oral Pulse rate:   65 / minute Resp:     18 per minute BP sitting:   100 / 60  (left arm) Cuff size:   large  Vitals Entered By: Glendell Docker CMA (March 12, 2010 11:15 AM)  O2 Flow:  Room air CC: 1 month follow up , Type 2 diabetes mellitus follow-up Is Patient Diabetic? Yes Did you bring your meter with you today? No Pain Assessment Patient in pain? no      Comments low blood sugar 120, high 140,  avg  120's , discuss blood work   Primary Care Iriel Nason:  D. [name] DO  CC:  1 month follow up  and Type 2 diabetes mellitus follow-up.  History of Present Illness:  Type 2 Diabetes Mellitus Follow-Up      This is a 77 year old woman who presents for Type 2 diabetes mellitus follow-up.  The patient denies self managed hypoglycemia, weight loss, and weight gain.  The patient denies the following symptoms: chest pain.  Since the last visit the patient reports good dietary compliance, compliance with medications, and monitoring blood glucose.    Preventive Screening-Counseling & Management  Alcohol-Tobacco     Smoking Status: never  Allergies: 1)  ! Sulfa 2)  ! * Actos  Past History:  Past Medical History: 1.HYPERTENSION  2.HYPERLIPIDEMIA  3 1. Coronary artery disease status Taxus drug-eluding stent to the LAD December 2006.     a.     Re-catheterization January 2007 for recurrent chest pain. Showed patent LAD stent, normal LV function. 4. GERD     5. ANEMIA   6.EDEMA  7.ENCOUNTER FOR LONG-TERM USE OF OTHER MEDICATIONS 8.HYPERKALEMIA 9. GLOSSITIS 10. LEG PAIN, LEFT  11.ROUTINE GENERAL MEDICAL EXAM@[email or web address]  CARE FACL  12.RENAL DISEASE, CHRONIC, MILD  13.PYURIA 14.ANEMIA, IRON DEFICIENCY  15.UTI  16. FOOT PAIN,  BILATERAL 17.OSTEOPENIA 18.DIVERTICULOSIS, COLON  19.DIABETES MELLITUS, TYPE II  20.Coronary artery disease s/p LAD stent 21.Lower Extremity edema with normal BNP 23 - Polyneuropathy    Past Surgical History: Cholecystectomy Hysterectomy 1969 (no bs0)  PTCA/stent    Taxus drug-eluting stent  to the LAD in December 2006, recatheterization in January 2007 for  recurrent chest pain that showed a patent LAD stent with normal LV function. 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   Lungs:  normal respiratory effort and normal breath sounds.   Heart:  normal rate, regular rhythm, and no gallop.   Extremities:  trace left pedal edema and trace right pedal edema.    Diabetes Management Exam:    Foot Exam (with socks and/or shoes not present):       Sensory-Monofilament:          Left foot: absent          Right foot: absent   Impression & Recommendations:  Problem # 1:  DIABETES MELLITUS, TYPE II (ICD-250.00) Assessment Improved  The following medications were removed from the medication list:    Januvia 100 Mg Tabs (Sitagliptin phosphate) .Marland Kitchen... 1/2 tablet by mouth once daily Her updated medication list for this problem includes:    Glyburide 5 Mg Tabs (Glyburide) .Marland Kitchen... Take 1 tablet by mouth once a day    Lantus Solostar 100 Unit/ml Soln (Insulin glargine) .Marland Kitchen... 5-10 units once daily    Lisinopril-hydrochlorothiazide 10-12.5 Mg Tabs (Lisinopril-hydrochlorothiazide) ..... One half tab by mouth once daily    Aspirin 81 Mg Tabs (Aspirin) .Marland Kitchen... 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 One by mouth two times a day    Lisinopril-hydrochlorothiazide 10-12.5 Mg Tabs (Lisinopril-hydrochlorothiazide) ..... One half tab by mouth once daily  BP today: 100/60 Prior BP: 112/62 (01/26/2010)  Labs Reviewed: K+: 5.2 (02/07/2010) Creat: : 1.69 (02/07/2010)   Chol: 163 (02/08/2010)   HDL: 48 (02/08/2010)   LDL: 78 (02/08/2010)   TG: 183 (02/08/2010)  Complete Medication List: 1)  Lovastatin 40 Mg Tabs (Lovastatin) .... Take 1 by mouth at bedtime 2)  Multivitamins Tabs (Multiple vitamin) .... Take 1 by mouth qd 3)  Prilosec 20 Mg Cpdr (Omeprazole) .... Take 1 by mouth as needed 4)  Nitrostat 0.4 Mg Subl (Nitroglycerin) .... Take prn 5)  Carvedilol 6.25 Mg Tabs (Carvedilol) .... One by mouth two times a day 6)  Fenofibrate 54 Mg Tabs (Fenofibrate) .... Take 1 tablet by mouth once a day 7)  Glyburide 5 Mg Tabs (Glyburide) .... Take 1 tablet by mouth once a day 8)  Lantus Solostar 100 Unit/ml Soln (Insulin glargine) .... 5-10 units once daily 9)  Relion Pen Needles 31g X 8 Mm Misc (Insulin pen needle) .... Use once daily 10)  Lisinopril-hydrochlorothiazide 10-12.5 Mg Tabs (Lisinopril-hydrochlorothiazide) .... One half tab by mouth once daily 11)  Freestyle Lite Test Strp (Glucose blood) .... Use to check blood sugar two times a day ( diabetes 250.00) 12)  Triamcinolone Acetonide 0.1 % Crea (Triamcinolone acetonide) .... Apply two times a day x 1 week 13)  Freestyle Lancets Misc (Lancets) .... Use to test blood sugar two times a day ( diabetes 250.00) 14)  Freestyle Freedom Lite W/device Kit (Blood glucose monitoring suppl) 15)  Aspirin 81 Mg Tabs (Aspirin) .... 2 tablets by mouth once daily 16)  Novolog  Flexpen 100 Unit/ml Soln (Insulin aspart) .... 5 to 10 units before lunch  Patient Instructions: 1)  Please schedule a follow-up appointment in 2 months. 2)  BMP prior to visit, ICD-9: 401.9 3)  HbgA1C prior to visit, ICD-9: 250.02 4)  Urine Microalbumin prior to visit, ICD-9: 250.02 5)  Please return for lab work one (1) week before your next appointment.    Orders Added: 1)  Est. Patient Level III [16109]       Current Allergies (reviewed today): ! SULFA ! * ACTOS

## 2010-05-29 NOTE — Miscellaneous (Signed)
Summary: Orders Update  Clinical Lists Changes  Orders: Added new Test order of T-Hepatic Function ([id number]) - Signed Added new Test order of T-Lipid Profile ([id number]) - Signed 

## 2010-05-29 NOTE — Progress Notes (Signed)
Summary: Lab Results  Phone Note Outgoing Call   Summary of Call: call pt - kidney function still slightly elevated.  stop taking metformin.  I suggest f/u appt next week to discuss other treatment options for diabetes.  her B12 is also low.  she will need monthly B12 injections.  potassium in slightly elevated.  please advise pt to avoid high K foods.  I will further discuss  lab results at  next follow up appointment.   Initial call taken by: D. [name] DO,  May 05, 2009 4:56 PM  Follow-up for Phone Call        patient advised per Dr Artist Pais instructions. Appointment scheduled for 05/10/2009 @ 10:30am Follow-up by: Glendell Docker CMA,  May 05, 2009 5:16 PM

## 2010-05-29 NOTE — Assessment & Plan Note (Signed)
Summary: left foot swollen/dt   Vital Signs:  Patient profile:   77 year old female Height:      67 inches Weight:      181.75 pounds BMI:     28.57 O2 Sat:      100 % on Room air Temp:     97.6 degrees F oral Pulse rate:   63 / minute Pulse rhythm:   regular Resp:     16 per minute BP sitting:   120 / 60  (right arm) Cuff size:   large  Vitals Entered By: Glendell Docker CMA (January 05, 2010 10:54 AM)  O2 Flow:  Room air CC: left foot swelling, Type 2 diabetes mellitus follow-up Is Patient Diabetic? Yes Did you bring your meter with you today? No Pain Assessment Patient in pain? no      Comments c/ o left foot swelling for the past 8 weeks, she denies any pain, some swelling relieved with elevation   Primary Care Provider:  Dondra Spry DO  CC:  left foot swelling and Type 2 diabetes mellitus follow-up.  History of Present Illness:  Type 2 Diabetes Mellitus Follow-Up      This is a 77 year old woman who presents for Type 2 diabetes mellitus follow-up.  The patient denies self managed hypoglycemia and hypoglycemia requiring help.  The patient denies the following symptoms: chest pain.  Since the last visit the patient reports compliance with medications.    pt c/o lower ext swelling.  somewhat worse in left ankle no calf redness or pain  Preventive Screening-Counseling & Management  Alcohol-Tobacco     Smoking Status: never  Allergies: 1)  ! Sulfa 2)  ! * Actos  Past History:  Past Medical History: 1.HYPERTENSION  2.HYPERLIPIDEMIA  3 1. Coronary artery disease status Taxus drug-eluding stent to the LAD December 2006.     a.     Re-catheterization January 2007 for recurrent chest pain. Showed patent LAD stent, normal LV function. 4. GERD     5. ANEMIA   6.EDEMA 7.ENCOUNTER FOR LONG-TERM USE OF OTHER MEDICATIONS 8.HYPERKALEMIA 9. GLOSSITIS 10. LEG PAIN, LEFT  11.ROUTINE GENERAL MEDICAL EXAM@[email or web address]  CARE FACL  12.RENAL DISEASE, CHRONIC, MILD    13.PYURIA 14.ANEMIA, IRON DEFICIENCY  15.UTI  16. FOOT PAIN, BILATERAL 17.OSTEOPENIA 18.DIVERTICULOSIS, COLON  19.DIABETES MELLITUS, TYPE II  20.Coronary artery disease s/p LAD stent 21.Lower Extremity edema with normal BNP 23 - Polyneuropathy    Past Surgical History: Cholecystectomy Hysterectomy 1969 (no bs0)  PTCA/stent   Taxus drug-eluting stent  to the LAD in December 2006, recatheterization in January 2007 for  recurrent chest pain that showed a patent LAD stent with normal LV function. 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   Lungs:  normal respiratory effort and normal breath sounds.   Heart:  normal rate, regular rhythm, and no gallop.   Abdomen:  soft, non-tender, and normal bowel sounds.   Extremities:  trace left pedal edema and trace right pedal edema.     Impression & Recommendations:  Problem # 1:  EDEMA (ICD-782.3) pt with chronic mild LE edema.  She declines compression stockings.  continue low salt diet and elevating  her legs at least once daily no groin lymphadenopathy  Her updated medication list for this problem includes:    Lisinopril-hydrochlorothiazide 10-12.5 Mg Tabs (Lisinopril-hydrochlorothiazide) ..... One half tab by mouth once daily  Problem # 2:  DIABETES MELLITUS, TYPE II (ICD-250.00) Assessment: Unchanged no hypoglycemia.  check labs The following medications were removed from the medication list:    Adult Aspirin Low Strength 81 Mg Tbdp (Aspirin) .Marland Kitchen... Take 1 by mouth qd Her updated medication list for this problem includes:    Glyburide 5 Mg Tabs (Glyburide) .Marland Kitchen... Take 1 tablet by mouth once a day    Januvia 100 Mg Tabs (Sitagliptin phosphate)  .Marland Kitchen... 1/2 tablet by mouth once daily    Lantus Solostar 100 Unit/ml Soln (Insulin glargine) .Marland Kitchen... 5-10 units once daily    Lisinopril-hydrochlorothiazide 10-12.5 Mg Tabs (Lisinopril-hydrochlorothiazide) ..... One half tab by mouth once daily  Orders: T-Basic Metabolic Panel [phone]) T- Hemoglobin A1C ([id number])  Complete Medication List: 1)  Plavix 75 Mg Tabs (Clopidogrel bisulfate) .... Take 1 by mouth qd 2)  Lovastatin 40 Mg Tabs (Lovastatin) .... Take 1 by mouth at bedtime 3)  Multivitamins Tabs (Multiple vitamin) .... Take 1 by mouth qd 4)  Prilosec 20 Mg Cpdr (Omeprazole) .... Take 1 by mouth as needed 5)  Nitrostat 0.4 Mg Subl (Nitroglycerin) .... Take prn 6)  Carvedilol 6.25 Mg Tabs (Carvedilol) .... One by mouth two times a day 7)  Fenofibrate 54 Mg Tabs (Fenofibrate) .... Take 1 tablet by mouth once a day 8)  Glyburide 5 Mg Tabs (Glyburide) .... Take 1 tablet by mouth once a day 9)  Januvia 100 Mg Tabs (Sitagliptin phosphate) .... 1/2 tablet by mouth once daily 10)  Lantus Solostar 100 Unit/ml Soln (Insulin glargine) .... 5-10 units once daily 11)  Relion Pen Needles 31g X 8 Mm Misc (Insulin pen needle) .... Use once daily 12)  Lisinopril-hydrochlorothiazide 10-12.5 Mg Tabs (Lisinopril-hydrochlorothiazide) .... One half tab by mouth once daily 13)  Freestyle Lite Test Strp (Glucose blood) .... Use to check blood sugar two times a day ( diabetes 250.00) 14)  Triamcinolone Acetonide 0.1 % Crea (Triamcinolone acetonide) .... Apply two times a day x 1 week 15)  Freestyle Lancets Misc (Lancets) .... Use to test blood sugar two times a day ( diabetes 250.00) 16)  Freestyle Freedom Lite W/device Kit (Blood glucose monitoring suppl)  Other Orders: Influenza Vaccine MCR ([id number]) Flu Vaccine 6yrs + MEDICARE PATIENTS (E9528)  Patient Instructions: 1)  Please schedule a follow-up appointment in 4 months. Prescriptions: PLAVIX 75 MG  TABS (CLOPIDOGREL BISULFATE) take 1 by mouth qd   #30 Each x 5   Entered and Authorized by:   D. [name] DO   Signed by:   D. [name] DO on 01/05/2010   Method used:   Electronically to        [facility] [address] (retail)       [phone]       [place], [place]  [address]       Ph: [phone]       Fax: [phone]   RxID:   [id number]   Current Allergies (reviewed today): ! SULFA ! * ACTOS   Immunizations Administered:  Influenza Vaccine # 1:    Vaccine Type: Fluvax MCR    Site: right deltoid    Mfr: GlaxoSmithK[employer]    Dose: 0.5 ml    Route: IM    Given by: Agustin Cree  Knight CMA    Exp. Date: 10/27/2010    Lot #: [id number]    VIS given: 11/21/09 version given January 05, 2010.  Flu Vaccine Consent Questions:    Do you have a history of severe allergic reactions to this vaccine? no    Any prior history of allergic reactions to egg and/or gelatin? no    Do you have a sensitivity to the preservative Thimersol? no    Do you have a past history of Guillan-Barre Syndrome? no    Do you currently have an acute febrile illness? no    Have you ever had a severe reaction to latex? no    Vaccine information given and explained to patient? yes    Are you currently pregnant? no

## 2010-05-29 NOTE — Assessment & Plan Note (Signed)
Summary: [name]      Allergies Added:   Visit Type:  Follow-up Primary Provider:  Dondra Spry DO  CC:  no complaints.  History of Present Illness: My is a delightful 77 year old woman with a history of a coronary artery disease status post previous Taxus LAD stent Dec 2006, hypertension hyperlipidemia and diabetes. She also has severe neuropathy. She returns in routine followup.  Cardiac his point of view she is doing quite well she denies any chest pain or shortness of breath. She continues to have severe LE neuropathy due to her diabetes and makes it hard for her to walk. Asking if she can come off Plavix due to being in the donut hole. No CHF.   Dr. Artist Pais following her lipids.   Current Medications (verified): 1)  Plavix 75 Mg  Tabs (Clopidogrel Bisulfate) .... Take 1 By Mouth Qd 2)  Lovastatin 40 Mg  Tabs (Lovastatin) .... Take 1 By Mouth At Bedtime 3)  Multivitamins   Tabs (Multiple Vitamin) .... Take 1 By Mouth Qd 4)  Prilosec 20 Mg  Cpdr (Omeprazole) .... Take 1 By Mouth As Needed 5)  Nitrostat 0.4 Mg  Subl (Nitroglycerin) .... Take Prn 6)  Carvedilol 6.25 Mg Tabs (Carvedilol) .... One By Mouth Two Times A Day 7)  Fenofibrate 54 Mg Tabs (Fenofibrate) .... Take 1 Tablet By Mouth Once A Day 8)  Glyburide 5 Mg Tabs (Glyburide) .... Take 1 Tablet By Mouth Once A Day 9)  Januvia 100 Mg Tabs (Sitagliptin Phosphate) .... 1/2 Tablet By Mouth Once Daily 10)  Lantus Solostar 100 Unit/ml Soln (Insulin Glargine) .... 5-10 Units Once Daily 11)  Relion Pen Needles 31g X 8 Mm Misc (Insulin Pen Needle) .... Use Once Daily 12)  Lisinopril-Hydrochlorothiazide 10-12.5 Mg Tabs (Lisinopril-Hydrochlorothiazide) .... One Half Tab By Mouth Once Daily 13)  Freestyle Lite Test  Strp (Glucose Blood) .... Use To Check Blood Sugar Two Times A Day ( Diabetes 250.00) 14)  Triamcinolone Acetonide 0.1 % Crea (Triamcinolone Acetonide) .... Apply Two Times A Day X 1 Week 15)  Freestyle Lancets  Misc (Lancets)  .... Use To Test Blood Sugar Two Times A Day ( Diabetes 250.00) 16)  Freestyle Freedom Lite W/device Kit (Blood Glucose Monitoring Suppl)  Allergies (verified): 1)  ! Sulfa 2)  ! * Actos  Past History:  Past Medical History: Reviewed history from 01/05/2010 and no changes required. 1.HYPERTENSION  2.HYPERLIPIDEMIA  3 1. Coronary artery disease status Taxus drug-eluding stent to the LAD December 2006.     a.     Re-catheterization January 2007 for recurrent chest pain. Showed patent LAD stent, normal LV function. 4. GERD     5. ANEMIA   6.EDEMA 7.ENCOUNTER FOR LONG-TERM USE OF OTHER MEDICATIONS 8.HYPERKALEMIA 9. GLOSSITIS 10. LEG PAIN, LEFT  11.ROUTINE GENERAL MEDICAL EXAM@[email or web address]  CARE FACL  12.RENAL DISEASE, CHRONIC, MILD  13.PYURIA 14.ANEMIA, IRON DEFICIENCY  15.UTI  16. FOOT PAIN, BILATERAL 17.OSTEOPENIA 18.DIVERTICULOSIS, COLON  19.DIABETES MELLITUS, TYPE II  20.Coronary artery disease s/p LAD stent 21.Lower Extremity edema with normal BNP 23 - Polyneuropathy    Review of Systems       As per HPI and past medical history; otherwise all systems negative.   Vital Signs:  Patient profile:   77 year old female Height:      67 inches Weight:      183 pounds BMI:     28.77 Pulse rate:   67 / minute BP sitting:   112 / 62  (left  arm) Cuff size:   regular  Vitals Entered By: Hardin Negus, RMA (January 26, 2010 9:06 AM)  Physical Exam  General:  Well appearing. no resp difficulty HEENT: normal Neck: supple. no JVD. Carotids 2+ bilat; no bruits. . Cor: PMI nondisplaced. Regular rate & rhythm. No rubs, gallops, murmur. Lungs: clear Abdomen: soft, nontender, nondistended. No hepatosplenomegaly. No bruits or masses. Good bowel sounds. Extremities: no cyanosis, clubbing, rash, tr. edema Neuro: alert & orientedx3, cranial nerves grossly intact. moves all 4 extremities w/o difficulty. affect pleasant    New Orders:     1)  EKG w/ Interpretation (93000)  Due:  01/26/2010     2)  Nuclear Stress Test (Nuc Stress Test)  Due: 01/26/2010   Impression & Recommendations:  Problem # 1:  CORONARY ARTERY DISEASE (ICD-414.00) Doing well. Now 5 years out from stent. Will check Lexiscan Myoview to assess for ischemia (can't walk well). Long discussion about risks/benefits of stopping Plavix, she prefers to continue at this point.  Problem # 2:  HYPERLIPIDEMIA (ICD-272.4) Overdue for lipid check. She is due for yearly labs next month with Dr. Artist Pais will add lipids on. Goal LDL < 70.   Problem # 3:  HYPERTENSION (ICD-401.9) Blood pressure well controlled. Continue current regimen.  Other Orders: EKG w/ Interpretation (93000) Nuclear Stress Test (Nuc Stress Test)  Patient Instructions: 1)  Your physician has requested that you have a Lexiscan myoview.  For further information please visit [email or web address]  Please follow instruction sheet, as given. 2)  Your physician recommends that you return for a FASTING lipid and liver profile: have done with your other labs at Dr Olegario Messier office 3)  Your physician wants you to follow-up in:  1 year.  You will receive a reminder letter in the mail two months in advance. If you don't receive a letter, please call our office to schedule the follow-up appointment.

## 2010-05-29 NOTE — Progress Notes (Signed)
Summary: Test Results  Phone Note Outgoing Call   Summary of Call: call pt - no acute changes on renal u/s.  I will further discuss at next OV Initial call taken by: D. [name] DO,  May 12, 2009 5:39 PM  Follow-up for Phone Call        attempted to contact patient at  [phone] no answer, voice message left to return call Follow-up by: Glendell Docker CMA,  May 12, 2009 5:43 PM  Additional Follow-up for Phone Call Additional follow up Details #1::        Patient returned call   informed as directed by Dr Artist Pais Additional Follow-up by: Darral Dash,  May 15, 2009 8:32 AM

## 2010-05-29 NOTE — Medication Information (Signed)
Summary: [facility]   Imported By: Lanelle Bal 06/02/2009 12:05:30  _____________________________________________________________________  External Attachment:    Type:   Image     Comment:   External Document

## 2010-05-29 NOTE — Progress Notes (Signed)
Summary: Lab Results  Phone Note Outgoing Call   Summary of Call: call pt - recent blood test shows kidney function slightly worse.  take 1/2 of BP medication.   remind pt - no NSAIDs Initial call taken by: D. [name] DO,  October 02, 2009 2:32 PM  Follow-up for Phone Call        attempted to contact patient at [phone], no answer, voice message left for patient to return call regarding lab results Follow-up by: Glendell Docker CMA,  October 02, 2009 2:47 PM  Additional Follow-up for Phone Call Additional follow up Details #1::        patient advised per Dr Artist Pais instructions, she has been provided written instructions, when she picked up glucose meter Additional Follow-up by: Glendell Docker CMA,  October 02, 2009 3:34 PM    New/Updated Medications: LISINOPRIL-HYDROCHLOROTHIAZIDE 10-12.5 MG TABS (LISINOPRIL-HYDROCHLOROTHIAZIDE) one half tab by mouth once daily

## 2010-05-29 NOTE — Assessment & Plan Note (Signed)
Summary: Discuss [name] diabetic issues- jr   Vital Signs:  Patient profile:   77 year old female Weight:      188 pounds BMI:     29.55 O2 Sat:      98 % on Room air Temp:     97.7 degrees F oral Pulse rate:   76 / minute Pulse rhythm:   regular Resp:     16 per minute BP sitting:   114 / 64  (right arm) Cuff size:   large  Vitals Entered By: Glendell Docker CMA (May 22, 2009 3:02 PM)  O2 Flow:  Room air  Primary Care Provider:  D. [name] DO  CC:  Dicuss Diabetes.  History of Present Illness: dicuss diabetes   77 y/o white female for follow up.  blood sugars still elevated despite using [place].  she is following diabetic diet.  low blood sugar 170- high 302,  avg 200 171- this am.   elevation is usually in the afternoon and evening  Allergies: 1)  ! Sulfa 2)  ! * Actos  Past History:  Past Medical History: 1.HYPERTENSION  2.HYPERLIPIDEMIA  3 1. Coronary artery disease status Taxus drug-eluding stent to the LAD December 2006.     a.     Re-catheterization January 2007 for recurrent chest pain. Showed patent LAD stent, normal LV function. 4. GERD   5. ANEMIA   6.EDEMA 7.ENCOUNTER FOR LONG-TERM USE OF OTHER MEDICATIONS 8.HYPERKALEMIA 9. GLOSSITIS 10. LEG PAIN, LEFT  11.ROUTINE GENERAL MEDICAL [email or web address]  CARE FACL  12.RENAL DISEASE, CHRONIC, MILD  13.PYURIA 14.ANEMIA, IRON DEFICIENCY  15.UTI  16. 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   Neck:  No deformities, masses, or tenderness noted.no carotid bruits.   Lungs:  normal respiratory effort  and normal breath sounds.   Heart:  Regular rate and rhythm without murmurs or gallops noted. Normal S1,S2.   Extremities:  bilateral puffy ankles   Impression & Recommendations:  Problem # 1:  DIABETES MELLITUS, TYPE II (ICD-250.00) Pt having hyperglycemia after stopping metformin.  no sign diff with [place].   she can't tolerate actos.  add lantus.  [name] updated medication list for this problem includes:    Adult Aspirin Low Strength 81 Mg Tbdp (Aspirin) .Marland Kitchen... Take 1 by mouth qd    Lisinopril 10 Mg Tabs (Lisinopril) ..... One by mouth once daily    Glyburide 5 Mg Tabs (Glyburide) .Marland Kitchen... Take 1 tablet by mouth once a day    Januvia 100 Mg Tabs (Sitagliptin phosphate) ..... One by mouth once daily    Lantus Solostar 100 Unit/ml Soln (Insulin glargine) .Marland Kitchen... 5-10 units once daily  Labs Reviewed: Creat: 1.63 (05/03/2009)    Reviewed HgBA1c results: 8.2 (05/03/2009)  7.7 (01/16/2009)  Complete Medication List: 1)  Plavix 75 Mg Tabs (Clopidogrel bisulfate) .... Take 1 by mouth qd 2)  Lovastatin 40 Mg Tabs (Lovastatin) .... Take 1 by mouth at bedtime 3)  Adult Aspirin Low Strength 81 Mg Tbdp (Aspirin) .... Take 1 by mouth qd 4)  Multivitamins Tabs (Multiple vitamin) .... Take 1 by mouth qd 5)  Prilosec 20  Mg Cpdr (Omeprazole) .... Take 1 by mouth as needed 6)  Hydrochlorothiazide 25 Mg Tabs (Hydrochlorothiazide) .... Take 1 by mouth qd 7)  Nitrostat 0.4 Mg Subl (Nitroglycerin) .... Take prn 8)  Carvedilol 6.25 Mg Tabs (Carvedilol) .... One by mouth two times a day 9)  Lisinopril 10 Mg Tabs (Lisinopril) .... One by mouth once daily 10)  Fenofibrate 54 Mg Tabs (Fenofibrate) .... Take 1 tablet by mouth once a day 11)  Glyburide 5 Mg Tabs (Glyburide) .... Take 1 tablet by mouth once a day 12)  Januvia 100 Mg Tabs (Sitagliptin phosphate) .... One by mouth once daily 13)  Lantus Solostar 100 Unit/ml Soln (Insulin glargine) .... 5-10 units once daily 14)  Relion Pen Needles 31g X 8 Mm Misc  (Insulin pen needle) .... Use once daily  Patient Instructions: 1)  Please schedule a follow-up appointment in 1 month. Prescriptions: RELION PEN NEEDLES 31G X 8 MM MISC (INSULIN PEN NEEDLE) use once daily  #100 x 3   Entered and Authorized by:   D. [name] DO   Signed by:   D. [name] DO on 05/22/2009   Method used:   Electronically to        [facility] [address] (retail)       [phone]       [place], [place]  [address]       Ph: [phone]       Fax: [phone]   RxID:   [id number] LANTUS SOLOSTAR 100 UNIT/ML SOLN (INSULIN GLARGINE) 5-10 units once daily  #1 month x 1   Entered and Authorized by:   D. [name] DO   Signed by:   D. [name] DO on 05/22/2009   Method used:   Electronically to        [facility] [address] [place] (retail)       [phone]       [place], [place]  [address]       Ph: [phone]       Fax: [phone]   RxID:   [id number]   Current Allergies (reviewed today): ! SULFA ! * ACTOS

## 2010-05-29 NOTE — Progress Notes (Signed)
Summary: Lab Results  Phone Note Outgoing Call   Summary of Call: call pt - recent blood test shows kidney function is slight worse.  please make sure pt taking 1/2 of lisinopril / hctz also make sure pt not taking NSAIDs I suggest we repeat BMET in 1 month Initial call taken by: D. [name] DO,  January 08, 2010 1:33 PM  Follow-up for Phone Call        patient has been advised per Dr Artist Pais instructions. She states she is taking medications as prescribed and has not taken any NSAIDS, and that she uses Tylenol for any pain that she has.  Follow-up by: Glendell Docker CMA,  January 08, 2010 1:42 PM

## 2010-05-29 NOTE — Assessment & Plan Note (Signed)
Summary: 1 month follow up/mhf   Vital Signs:  Patient profile:   77 year old female Weight:      184.25 pounds BMI:     28.96 O2 Sat:      98 % on Room air Temp:     97.9 degrees F oral Pulse rate:   64 / minute Pulse rhythm:   regular Resp:     16 per minute BP sitting:   110 / 60  (left arm) Cuff size:   large  Vitals Entered By: Glendell Docker CMA (June 19, 2009 10:46 AM)  O2 Flow:  Room air CC: 1 Month Follow Up, Type 2 diabetes mellitus follow-up Is Patient Diabetic? Yes Comments 1 Month Follow up  since starting the Lantus she has had constipation low blood sugar 101 high 173 avg- 140's - blood gluscose this am was 153   Primary Care Provider:  Dondra Spry DO  CC:  1 Month Follow Up and Type 2 diabetes mellitus follow-up.  History of Present Illness:  Type 2 Diabetes Mellitus Follow-Up      This is a 77 year old woman who presents for Type 2 diabetes mellitus follow-up.  The patient denies self managed hypoglycemia, hypoglycemia requiring help, and weight gain.  The patient denies the following symptoms: chest pain.  Since the last visit the patient reports good dietary compliance, compliance with medications, and monitoring blood glucose.  she started walking on treadmill short distances.  she is worried she is going to fall.  she has injured her toe in the past  Htn - no dizziness.  no c/p  Preventive Screening-Counseling & Management  Alcohol-Tobacco     Smoking Status: never  Allergies: 1)  ! Sulfa 2)  ! * Actos  Past History:  Past Medical History: 1.HYPERTENSION  2.HYPERLIPIDEMIA  3 1. Coronary artery disease status Taxus drug-eluding stent to the LAD December 2006.     a.     Re-catheterization January 2007 for recurrent chest pain. Showed patent LAD stent, normal LV function. 4. GERD    5. ANEMIA   6.EDEMA 7.ENCOUNTER FOR LONG-TERM USE OF OTHER MEDICATIONS 8.HYPERKALEMIA 9. GLOSSITIS 10. LEG PAIN, LEFT  11.ROUTINE GENERAL MEDICAL  [email or web address]  CARE FACL  12.RENAL DISEASE, CHRONIC, MILD  13.PYURIA 14.ANEMIA, IRON DEFICIENCY  15.UTI  16. FOOT PAIN, BILATERAL 17.OSTEOPENIA 18.DIVERTICULOSIS, COLON  19.DIABETES MELLITUS, TYPE II  20.Coronary artery disease s/p LAD stent 21.Lower Extremity edema with normal BNP 23 - Polyneuropathy  Past Surgical History: Cholecystectomy Hysterectomy 1969 (no bs0) PTCA/stent   Taxus drug-eluting stent  to the LAD in December 2006, recatheterization in January 2007 for  recurrent chest pain that showed a patent LAD stent with normal LV function. EGD (04/15/2006) Stress Cardiolite (02/24/2006)  EKG (06/18/2006)   Irrigation and debridement, open reduction, and fixation  using a vertical mattress 3-0 nylon suture.   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   Neck:  No deformities, masses, or tenderness noted.no carotid bruits.   Lungs:  normal respiratory effort and normal breath sounds.   Heart:  normal rate, regular rhythm, and no gallop.   Extremities:  bilateral puffy ankles but no pitting edema  Neurologic:  cranial nerves II-XII intact and gait normal.    Diabetes Management Exam:  Foot Exam (with socks and/or shoes not present):       Sensory-Monofilament:          Left foot: absent          Right foot: absent   Impression & Recommendations:  Problem # 1:  DIABETES MELLITUS, TYPE II (ICD-250.00) Assessment Improved she is not having any hypoglycemia.  she has started to walk on treadmill.  she is being very careful due to peripheral neuropathy.  continue [place] and lantus.  we discussed making minor adjustments to lantus dose depending on carb intake and exercise pattern.  The following medications were removed from the medication list:     Lisinopril 10 Mg Tabs (Lisinopril) ..... One by mouth once daily Her updated medication list for this problem includes:    Adult Aspirin Low Strength 81 Mg Tbdp (Aspirin) .Marland Kitchen... Take 1 by mouth qd    Glyburide 5 Mg Tabs (Glyburide) .Marland Kitchen... Take 1 tablet by mouth once a day    Januvia 100 Mg Tabs (Sitagliptin phosphate) ..... One by mouth once daily    Lantus Solostar 100 Unit/ml Soln (Insulin glargine) .Marland Kitchen... 5-10 units once daily    Lisinopril-hydrochlorothiazide 10-12.5 Mg Tabs (Lisinopril-hydrochlorothiazide) ..... One by mouth once daily  Labs Reviewed: Creat: 1.63 (05/03/2009)    Reviewed HgBA1c results: 8.2 (05/03/2009)  7.7 (01/16/2009)  Problem # 2:  HYPERTENSION (ICD-401.9) BP low normal.  reduce dose of HCTZ  The following medications were removed from the medication list:    Hydrochlorothiazide 25 Mg Tabs (Hydrochlorothiazide) .Marland Kitchen... Take 1 by mouth qd    Lisinopril 10 Mg Tabs (Lisinopril) ..... One by mouth once daily Her updated medication list for this problem includes:    Carvedilol 6.25 Mg Tabs (Carvedilol) ..... One by mouth two times a day    Lisinopril-hydrochlorothiazide 10-12.5 Mg Tabs (Lisinopril-hydrochlorothiazide) ..... One by mouth once daily  BP today: 110/60 Prior BP: 114/64 (05/22/2009)  Labs Reviewed: K+: 5.4 (05/03/2009) Creat: : 1.63 (05/03/2009)   Chol: 148 (02/17/2009)   HDL: 43.30 (02/17/2009)   LDL: 69 (02/17/2009)   TG: 178.0 (02/17/2009)  Complete Medication List: 1)  Plavix 75 Mg Tabs (Clopidogrel bisulfate) .... Take 1 by mouth qd 2)  Lovastatin 40 Mg Tabs (Lovastatin) .... Take 1 by mouth at bedtime 3)  Adult Aspirin Low Strength 81 Mg Tbdp (Aspirin) .... Take 1 by mouth qd 4)  Multivitamins Tabs (Multiple vitamin) .... Take 1 by mouth qd 5)  Prilosec 20 Mg Cpdr (Omeprazole) .... Take 1 by mouth as needed 6)  Nitrostat 0.4 Mg Subl (Nitroglycerin) .... Take prn 7)  Carvedilol 6.25 Mg Tabs (Carvedilol) .... One by mouth two times a day 8)   Fenofibrate 54 Mg Tabs (Fenofibrate) .... Take 1 tablet by mouth once a day 9)  Glyburide 5 Mg Tabs (Glyburide) .... Take 1 tablet by mouth once a day 10)  Januvia 100 Mg Tabs (Sitagliptin phosphate) .... One by mouth once daily 11)  Lantus Solostar 100 Unit/ml Soln (Insulin glargine) .... 5-10 units once daily 12)  Relion Pen Needles 31g X 8 Mm Misc (Insulin pen needle) .... Use once daily 13)  Lisinopril-hydrochlorothiazide 10-12.5 Mg Tabs (Lisinopril-hydrochlorothiazide) .... One by mouth once daily  Patient Instructions: 1)  Please schedule a follow-up appointment in 3 months. 2)  BMP prior to visit, ICD-9: 401.9 3)  HbgA1C prior to visit, ICD-9:  250.02 4)  Take 1/2 of HCTZ 25 mg, then start prescription for combination lisinopril / hctz Prescriptions: JANUVIA 100 MG TABS (SITAGLIPTIN  PHOSPHATE) one by mouth once daily  #30 x 3   Entered and Authorized by:   D. [name] DO   Signed by:   D. [name] DO on 06/19/2009   Method used:   Electronically to        [facility] [address] (retail)       [phone]       [place], [place]  [address]       Ph: [phone]       Fax: [phone]   RxID:   [id number] LISINOPRIL-HYDROCHLOROTHIAZIDE 10-12.5 MG TABS (LISINOPRIL-HYDROCHLOROTHIAZIDE) one by mouth once daily  #90 x 1   Entered and Authorized by:   D. [name] DO   Signed by:   D. [name] DO on 06/19/2009   Method used:   Print then Give to Patient   RxID:   [id number]   Current Allergies (reviewed today): ! SULFA ! * ACTOS

## 2010-05-29 NOTE — Assessment & Plan Note (Signed)
Summary: Cardiology Nuclear Testing  Nuclear Med Background Indications for Stress Test: Evaluation for Ischemia, Stent Patency   History: Heart Catheterization, Myocardial Perfusion Study, Stents  History Comments: '06 Stent-LAD; '07 [id number]  Symptoms: Dizziness, DOE, Near Syncope    Nuclear Pre-Procedure Cardiac Risk Factors: Family History - CAD, Hypertension, IDDM Type 2, Lipids Caffeine/Decaff Intake: None NPO After: 6:00 PM Lungs: Clear.  O2 Sat 98% on RA. IV 0.9% NS with Angio Cath: 22g     IV Site: R Antecubital IV Started by: Irean Hong, RN Chest Size (in) 44     Cup Size B     Height (in): 67 Weight (lb): 181 BMI: 28.45  Nuclear Med Study 1 or 2 day study:  1 day     Stress Test Type:  Eugenie Birks Reading MD:  Kristeen Miss, MD     Referring MD:  Arvilla Meres, MD Resting Radionuclide:  Technetium 79m Tetrofosmin     Resting Radionuclide Dose:  11 mCi  Stress Radionuclide:  Technetium 86m Tetrofosmin     Stress Radionuclide Dose:  33 mCi   Stress Protocol   Lexiscan: 0.4 mg   Stress Test Technologist:  Rea College, CMA-N     Nuclear Technologist:  Domenic Polite, CNMT  Rest Procedure  Myocardial perfusion imaging was performed at rest 45 minutes following the intravenous administration of Technetium 37m Tetrofosmin.  Stress Procedure  The patient received IV Lexiscan 0.4 mg over 15-seconds.  Technetium 56m Tetrofosmin injected at 30-seconds.  There were no significant changes with infusion.  Quantitative spect images were obtained after a 45 minute delay.  QPS Raw Data Images:  Normal; no motion artifact; normal heart/lung ratio. Stress Images:  Normal homogeneous uptake in all areas of the myocardium. Rest Images:  Normal homogeneous uptake in all areas of the myocardium. Subtraction (SDS):  Normal Transient Ischemic Dilatation:  0.99  (Normal <1.22)  Lung/Heart Ratio:  0.29  (Normal <0.45)  Quantitative Gated Spect Images QGS EDV:  52 ml QGS  ESV:  15 ml QGS EF:  72 % QGS cine images:  Normal LV function  Findings Normal nuclear study      Overall Impression  Exercise Capacity: Lexiscan with no exercise. BP Response: Normal blood pressure response. Clinical Symptoms: No chest pain ECG Impression: No significant ST segment change suggestive of ischemia. Overall Impression: Normal stress nuclear study. Overall Impression Comments: Normal stress nuclear study  Appended Document: Cardiology Nuclear Testing ok  Appended Document: Cardiology Nuclear Testing pt aware

## 2010-05-29 NOTE — Progress Notes (Signed)
Summary: blood sugar issues  Phone Note Call from Patient   Caller: Patient Summary of Call: Pt. states she was seen here 1/14 and started Januvia and her blood sugars are running 172 at 0730 today, at 11:15AM it was 231.Marland KitchenMarland KitchenYesterday it was running  in the 200- 302.  What do you want her to do? Do you think she should take her diabetic meds differently? Call patient back at [phone] Initial call taken by: Michaelle Copas,  May 18, 2009 11:42 AM  Follow-up for Phone Call        Pt will need to start insulin.  I suggest OV next week Follow-up by: D. [name] DO,  May 18, 2009 12:02 PM  Additional Follow-up for Phone Call Additional follow up Details #1::        pt. made a appt. for 05/22/09 at 1500 Additional Follow-up by: Michaelle Copas,  May 18, 2009 1:41 PM

## 2010-05-29 NOTE — Miscellaneous (Signed)
Summary: Orders Update  Clinical Lists Changes  Orders: Added new Test order of T-Basic Metabolic Panel ([id number]) - Signed Added new Test order of T-Lipid Profile ([id number]) - Signed Added new Test order of T-Hepatic Function ([id number]) - Signed 

## 2010-05-29 NOTE — Miscellaneous (Signed)
Summary: BONE DENSITY  Clinical Lists Changes  Orders: Added new Test order of T-Bone Densitometry (77080) - Signed Added new Test order of T-Lumbar Vertebral Assessment (77082) - Signed 

## 2010-05-29 NOTE — Progress Notes (Signed)
Summary: Lantus Samples  Phone Note Call from Patient Call back at Home Phone [phone]   Caller: Patient Call For: D. [name] DO Summary of Call: patient called and left voice message requesting samples of Lantus Initial call taken by: Glendell Docker CMA,  January 17, 2010 1:53 PM  Follow-up for Phone Call        call returned to patient [phone] she was advised sample would be left at front desk for patient pick up. She states her husband will be stop by to pick samples up Follow-up by: Glendell Docker CMA,  January 17, 2010 1:55 PM    Prescriptions: LANTUS SOLOSTAR 100 UNIT/ML SOLN (INSULIN GLARGINE) 5-10 units once daily  #2 x 0   Entered by:   Glendell Docker CMA   Authorized by:   D. [name] DO   Signed by:   Glendell Docker CMA on 01/17/2010   Method used:   Samples Given   RxID:   [id number]

## 2010-05-29 NOTE — Progress Notes (Signed)
Summary: Lovastatin Refill  Phone Note Refill Request Message from:  Patient on June 07, 2009 8:41 AM  Refills Requested: Medication #1:  LOVASTATIN 40 MG  TABS take 1 by mouth at bedtime   Dosage confirmed as above?Dosage Confirmed   Brand Name Necessary? No   Supply Requested: 3 months  Method Requested: Electronic Next Appointment Scheduled: 06-12-09 Dr Artist Pais  Initial call taken by: Roselle Locus,  June 07, 2009 8:42 AM  Follow-up for Phone Call        Rx completed in Dr. Tiajuana Amass Follow-up by: Glendell Docker CMA,  June 07, 2009 10:18 AM    Prescriptions: LOVASTATIN 40 MG  TABS (LOVASTATIN) take 1 by mouth at bedtime  #30 x 3   Entered by:   Glendell Docker CMA   Authorized by:   D. [name] DO   Signed by:   Glendell Docker CMA on 06/07/2009   Method used:   Electronically to        [facility] [address] [place] (retail)       [phone]       [place], [place]  [address]       Ph: [phone]       Fax: [phone]   RxID:   [id number]

## 2010-05-29 NOTE — Letter (Signed)
   [place] [facility] [address] [place], [place]  [address]  [place] Phone: [phone]      March 05, 2010   [facility] [place] [address] [place], [place] [address]  RE:  LAB RESULTS  Dear  [name],  The following is an interpretation of your most recent lab tests.  Please take note of any instructions provided or changes to medications that have resulted from your lab work.      Bone density - normal        Sincerely Yours,    Dr. [name]  Appended Document:  mailed

## 2010-05-29 NOTE — Assessment & Plan Note (Signed)
Summary: to est/hea   Vital Signs:  Patient profile:   77 year old female Weight:      189.75 pounds BMI:     29.83 O2 Sat:      100 % on Room air Temp:     97.6 degrees F oral Pulse rate:   73 / minute Pulse rhythm:   regular Resp:     18 per minute BP sitting:   110 / 60  (left arm) Cuff size:   large  Vitals Entered By: Glendell Docker CMA (May 03, 2009 9:38 AM)  O2 Flow:  Room air  Contraindications/Deferment of Procedures/Staging:    Test/Procedure: PAP Smear    Reason for deferment: hysterectomy   Primary Care Provider:  Dondra Spry DO  CC:  New Patient transfer and Type 2 diabetes mellitus follow-up.  History of Present Illness:  Type 2 Diabetes Mellitus Follow-Up      This is a 77 year old woman who presents for Type 2 diabetes mellitus follow-up.  The patient denies self managed hypoglycemia and hypoglycemia requiring help.  The patient denies the following symptoms: chest pain.  Since the last visit the patient reports good dietary compliance, compliance with medications, and monitoring blood glucose.  Since the last visit, the patient reports having had eye care by an ophthalmologist.  she has chronic severe polyneuropathy.  polyneuropathy causes gait instability.  she has history of falls  Coronary artery disease-asymptomatic. No chest pain. No unusual shortness of breath.  Preventive Screening-Counseling & Management  Alcohol-Tobacco     Alcohol drinks/day: 0     Alcohol Counseling: not indicated; patient does not drink     Smoking Status: never     Tobacco Counseling: not indicated; no tobacco use  Caffeine-Diet-Exercise     Caffeine use/day: None     Caffeine Counseling: not indicated; caffeine use is not excessive or problematic     Does Patient Exercise: no  Allergies: 1)  ! Sulfa 2)  ! * Actos  Past History:  Past Medical History: 1.HYPERTENSION  2.HYPERLIPIDEMIA  3 1. Coronary artery disease status Taxus drug-eluding stent to the LAD  December 2006.     a.     Re-catheterization January 2007 for recurrent chest pain. Showed patent LAD stent, normal LV function. 4. GERD   5. ANEMIA 6.EDEMA 7.ENCOUNTER FOR LONG-TERM USE OF OTHER MEDICATIONS 8.HYPERKALEMIA 9. GLOSSITIS 10. LEG PAIN, LEFT  11.ROUTINE GENERAL MEDICAL EXAM@[email or web address]  CARE FACL  12.RENAL DISEASE, CHRONIC, MILD  13.PYURIA 14.ANEMIA, IRON DEFICIENCY  15.UTI  16. 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    Physical Exam  General:  alert, well-developed, and well-nourished.   Head:  normocephalic and atraumatic.   Ears:  R ear normal and L ear normal.   Mouth:  pharynx pink and moist.   Neck:  No deformities, masses, or tenderness noted.no carotid bruits.   Lungs:  normal respiratory effort and normal breath sounds.   Heart:  Regular rate and rhythm without murmurs or gallops noted. Normal S1,S2.   Abdomen:  soft, non-tender,  and normal bowel sounds. RUQ scar Extremities:  bilateral puffy ankles Neurologic:  cranial nerves II-XII intact.  unable to sense temperature and vibration-feet and hands  Diabetes Management Exam:    Foot Exam (with socks and/or shoes not present):       Sensory-Monofilament:          Left foot: absent          Right foot: absent       Nails:          Left foot: normal          Right foot: normal   Impression & Recommendations:  Problem # 1:  NEUROPATHY (ICD-69.60) 77 year old white female with present polyneuropathy from diabetes. Rule out B12  deficiency. She also has mild anemia. Check SPEP and UPEP.   Refer to podiatrist for foot care. I suspect patient will need custom shoes. She reports history of falls.  right great toe can curl under.    Orders: T-Vitamin B12 ([id number]) Podiatry Referral (Podiatry)  Problem # 2:  RENAL DISEASE, CHRONIC, MILD (ICD-585.2) Repeat BMET.  We discussed stopping metformin his creatinine persistently elevated. We discussed risk of lactic acidosis. Orders: T-Basic Metabolic Panel [id number]) T- * Misc. Laboratory test [phone])  Labs Reviewed: BUN: 30 (02/22/2009)   Cr: 1.7 (02/22/2009)    Hgb: 10.9 (01/16/2009)   Hct: 32.5 (01/16/2009)   Ca++: 9.4 (02/22/2009)    TP: 6.4 (02/17/2009)   Alb: 3.8 (02/17/2009)  Problem # 3:  ANEMIA (ICD-285.9) Monitor CBC Orders: T- * Misc. Laboratory test [id number]) T-CBC w/Diff [phone])  Hgb: 10.9 (01/16/2009)   Hct: 32.5 (01/16/2009)   Platelets: 250.0 (01/16/2009) RBC: 3.52 (01/16/2009)   RDW: 12.2 (01/16/2009)   WBC: 6.2 (01/16/2009) MCV: 92.3 (01/16/2009)   MCHC: 33.6 (01/16/2009) Iron: 71 (07/11/2008)   % Sat: 19.1 (07/11/2008) B12: 217 (03/24/2006)   TSH: 0.93 (01/16/2009)  Problem # 4:  DIABETES MELLITUS, TYPE II (ICD-250.00) patient's blood sugars are reasonably controlled. We discussed natural history of type 2 diabetes. She may eventually need insulin therapy. We discussed the importance of avoiding hypoglycemia. If poor p.o. intake or increase in activity patient advised to hold glyburide. Her updated medication list for this problem includes:    Glucophage Xr 500 Mg Tb24 (Metformin hcl) .Marland Kitchen... Take 2 two times a day    Adult Aspirin Low Strength 81 Mg Tbdp (Aspirin) .Marland Kitchen... Take 1 by mouth qd    Lisinopril 20 Mg Tabs (Lisinopril) .Marland Kitchen... Take 1 tablet by mouth once a day    Glyburide 5 Mg Tabs (Glyburide) .Marland Kitchen... Take 1 tablet by mouth once a day  Orders: T- Hemoglobin A1C ([id number]) T-Urine Microalbumin w/creat. ratio  [phone])  Labs Reviewed: Creat: 1.7 (02/22/2009)    Reviewed HgBA1c results: 7.7 (01/16/2009)  7.8 (07/11/2008)  Complete Medication List: 1)  Glucophage Xr 500 Mg Tb24 (Metformin hcl) .... Take 2 two times a day 2)  Plavix 75 Mg Tabs (Clopidogrel bisulfate) .... Take 1 by mouth qd 3)  Lovastatin 40 Mg Tabs (Lovastatin) .... Take 1 by mouth at bedtime 4)  Adult Aspirin Low Strength 81 Mg Tbdp (Aspirin) .... Take 1 by mouth qd 5)  Multivitamins Tabs (Multiple vitamin) .... Take 1 by mouth qd 6)  Prilosec 20 Mg Cpdr (Omeprazole) .... Take 1 by mouth as needed 7)  Hydrochlorothiazide 25 Mg Tabs (Hydrochlorothiazide) .... Take 1 by mouth qd 8)  Nitrostat 0.4 Mg Subl (Nitroglycerin) .... Take prn 9)  Coreg 12.5 Mg Tabs (Carvedilol) .... Take 1 tablet  by mouth once a day 10)  Lisinopril 20 Mg Tabs (Lisinopril) .... Take 1 tablet by mouth once a day 11)  Fenofibrate 54 Mg Tabs (Fenofibrate) .... Take 1 tablet by mouth once a day 12)  Glyburide 5 Mg Tabs (Glyburide) .... Take 1 tablet by mouth once a day  Patient Instructions: 1)  Please schedule a follow-up appointment in 2 months.     Preventive Care Screening  Pap Smear:    Date:  05/03/2009    Results:  Hsyterectomy-Declined   Current Allergies (reviewed today): ! SULFA ! * ACTOS

## 2010-05-29 NOTE — Letter (Signed)
Summary: [facility]   Imported By: Lanelle Bal 10/09/2009 12:12:52  _____________________________________________________________________  External Attachment:    Type:   Image     Comment:   External Document

## 2010-05-29 NOTE — Progress Notes (Signed)
Summary: Nuc. Pre-Procedure  Phone Note Outgoing Call Call back at [facility] Phone [phone]   Call placed by: Irean Hong, RN,  January 30, 2010 2:45 PM Summary of Call: Reviewed information on Myoview Information Sheet (see scanned document for further details).  Spoke with  patient.     Nuclear Med Background Indications for Stress Test: Evaluation for Ischemia, Stent Patency   History: Heart Catheterization, Stents  History Comments: '06 Stent LAD.     Nuclear Pre-Procedure Cardiac Risk Factors: Hypertension, IDDM Type 2, Lipids Height (in): 67

## 2010-05-29 NOTE — Miscellaneous (Signed)
Summary: Mammogram  Clinical Lists Changes  Observations: Added new observation of MAMMOGRAM: normal (06/05/2009 12:32)      Preventive Care Screening  Mammogram:    Date:  06/05/2009    Results:  normal

## 2010-05-29 NOTE — Letter (Signed)
Summary: Primary Care Consult Scheduled Letter  [facility]  [address]   [place], [place] [address]   Phone: [phone]  Fax: [phone]      05/03/2009 MRN: [id number]  [facility] [place] [address] [place], [place]  [address]    Dear Ms. Dickerman,      We have scheduled an appointment for you.  At the recommendation of Dr.YOO, we have scheduled you a consult with DR Candice Camp   on May 15, 2009 at 2:30PM.  Their address is_[address]  [address] . The office phone number is [phone].  If this appointment day and time is not convenient for you, please feel free to call the office of the doctor you are being referred to at the number listed above and reschedule the appointment.     It is important for you to keep your scheduled appointments. We are here to make sure you are given good patient care. If you have questions or you have made changes to your appointment, please notify us at  [phone], ask for HELEN.    Thank you,  Patient Care Coordinator [place] [facility]

## 2010-05-29 NOTE — Miscellaneous (Signed)
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

## 2010-05-29 NOTE — Letter (Signed)
Summary: [facility]  [facility]   Imported By: Lanelle Bal 11/24/2009 13:26:40  _____________________________________________________________________  External Attachment:    Type:   Image     Comment:   External Document

## 2010-06-06 ENCOUNTER — Telehealth: Payer: Self-pay | Admitting: Internal Medicine

## 2010-06-14 NOTE — Progress Notes (Signed)
Summary: Insulin Samples  Phone Note Call from Patient Call back at Home Phone [phone]   Caller: Patient Call For: [name]  Summary of Call: she needsd samples of lantus and novolog  Initial call taken by: Roselle Locus,  June 06, 2010 1:06 PM  Follow-up for Phone Call        call returned to patient at [phone], she has been advised samples of Novolog and Lantus would be left at front desk for patient pick up. Patient states her son [name] will stop by for her to pick samples up Follow-up by: Glendell Docker CMA,  June 06, 2010 1:31 PM    Prescriptions: NOVOLOG FLEXPEN 100 UNIT/ML SOLN (INSULIN ASPART) 5 to 10 units before lunch  #1 x 0   Entered by:   Glendell Docker CMA   Authorized by:   D. [name] DO   Signed by:   Glendell Docker CMA on 06/06/2010   Method used:   Samples Given   RxID:   [id number] LANTUS SOLOSTAR 100 UNIT/ML SOLN (INSULIN GLARGINE) 5-10 units once daily  #1 x 0   Entered by:   Glendell Docker CMA   Authorized by:   D. [name] DO   Signed by:   Glendell Docker CMA on 06/06/2010   Method used:   Samples Given   RxID:   [id number]

## 2010-06-21 ENCOUNTER — Encounter: Payer: Self-pay | Admitting: Internal Medicine

## 2010-06-21 LAB — CONVERTED CEMR LAB
Calcium: 9.7 mg/dL (ref 8.4–10.5)
Hgb A1c MFr Bld: 8.2 % — ABNORMAL HIGH (ref <5.7)
Microalb Creat Ratio: 17.4 mg/g (ref 0.0–30.0)
Microalb, Ur: 2.04 mg/dL — ABNORMAL HIGH (ref 0.00–1.89)
Potassium: 5.1 meq/L (ref 3.5–5.3)
Sodium: 142 meq/L (ref 135–145)

## 2010-06-26 NOTE — Miscellaneous (Signed)
Summary: lab order  Clinical Lists Changes  Orders: Added new Test order of T-Basic Metabolic Panel ([id number]) - Signed Added new Test order of T- Hemoglobin A1C ([id number]) - Signed Added new Test order of T-Urine Microalbumin w/creat. ratio ([id number]) - Signed 

## 2010-06-27 ENCOUNTER — Encounter: Payer: Self-pay | Admitting: Internal Medicine

## 2010-06-27 ENCOUNTER — Ambulatory Visit (INDEPENDENT_AMBULATORY_CARE_PROVIDER_SITE_OTHER): Payer: Medicare Other | Admitting: Internal Medicine

## 2010-06-27 DIAGNOSIS — N035 Chronic nephritic syndrome with diffuse mesangiocapillary glomerulonephritis: Secondary | ICD-10-CM

## 2010-06-27 DIAGNOSIS — E119 Type 2 diabetes mellitus without complications: Secondary | ICD-10-CM

## 2010-06-27 DIAGNOSIS — G589 Mononeuropathy, unspecified: Secondary | ICD-10-CM

## 2010-06-27 LAB — HM DIABETES FOOT EXAM

## 2010-07-17 NOTE — Assessment & Plan Note (Signed)
Summary: 3 mth follow up/ss   Vital Signs:  Patient profile:   77 year old female Height:      67 inches Weight:      183 pounds BMI:     28.77 O2 Sat:      100 % on Room air Temp:     97.6 degrees F oral Pulse rate:   62 / minute Resp:     20 per minute BP sitting:   126 / 72  (right arm) Cuff size:   large  Vitals Entered By: Glendell Docker CMA (June 27, 2010 10:07 AM)  O2 Flow:  Room air CC: 3 Month Follow up, Type 2 diabetes mellitus follow-up Is Patient Diabetic? Yes Pain Assessment Patient in pain? no      Comments low blood sugar 68 high 158, avg 100, no concerns   Primary Care Provider:  DThomos Lemons DO  CC:  3 Month Follow up and Type 2 diabetes mellitus follow-up.  History of Present Illness:  Type 2 Diabetes Mellitus Follow-Up      This is a 77 year old woman who presents for Type 2 diabetes mellitus follow-up.  The patient denies hypoglycemia requiring help.  The patient denies the following symptoms: chest pain.  Since the last visit the patient reports good dietary compliance, compliance with medications, and monitoring blood glucose.    Preventive Screening-Counseling & Management  Alcohol-Tobacco     Smoking Status: never  Allergies: 1)  ! Sulfa 2)  ! * Actos  Past History:  Past Medical History: 1.HYPERTENSION  2.HYPERLIPIDEMIA   3 1. Coronary artery disease status Taxus drug-eluding stent to the LAD December 2006.     a.     Re-catheterization January 2007 for recurrent chest pain. Showed patent LAD stent, normal LV function. 4. GERD     5. ANEMIA   6.EDEMA  7.ENCOUNTER FOR LONG-TERM USE OF OTHER MEDICATIONS 8.HYPERKALEMIA 9. GLOSSITIS 10. LEG PAIN, LEFT  11.ROUTINE GENERAL MEDICAL EXAM@[email or web address]  CARE FACL  12.RENAL DISEASE, CHRONIC, MILD  13.PYURIA 14.ANEMIA, IRON DEFICIENCY  15.UTI  16. FOOT PAIN, BILATERAL 17.OSTEOPENIA 18.DIVERTICULOSIS, COLON  19.DIABETES MELLITUS, TYPE II  20.Coronary artery disease s/p LAD  stent 21.Lower Extremity edema with normal BNP 23 - Polyneuropathy    Past Surgical History: Cholecystectomy Hysterectomy 1969 (no bs0)  PTCA/stent    Taxus drug-eluting stent  to the LAD in December 2006, recatheterization in January 2007 for  recurrent chest pain that showed a patent LAD stent with normal LV function. EGD (04/15/2006) Stress Cardiolite (02/24/2006)  EKG (06/18/2006)     Physical Exam  General:  alert, well-developed, and well-nourished.   Lungs:  normal respiratory effort and normal breath sounds.   Heart:  normal rate, regular rhythm, and no gallop.   Extremities:  trace left pedal edema and trace right pedal edema.   Neurologic:  cranial nerves II-XII intact.    Diabetes Management Exam:    Foot Exam (with socks and/or shoes not present):       Sensory-Monofilament:          Left foot: absent          Right foot: absent       Inspection:          Left foot: normal          Right foot: normal   Impression & Recommendations:  Problem # 1:  DIABETES MELLITUS, TYPE II (ICD-250.00) Assessment Unchanged increase use of mealtime insulin  The following medications were removed  from the medication list:    Glyburide 5 Mg Tabs (Glyburide) .Marland Kitchen... Take 1 tablet by mouth once a day Her updated medication list for this problem includes:    Lantus Solostar 100 Unit/ml Soln (Insulin glargine) .Marland Kitchen... 5-10 units once daily    Lisinopril-hydrochlorothiazide 10-12.5 Mg Tabs (Lisinopril-hydrochlorothiazide) ..... One half tab by mouth once daily    Aspirin 81 Mg Tabs (Aspirin) .Marland Kitchen... 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  Thinning and increased echogenicity of the renal cortices bilaterally s compatible with  e provided history of chronic medical renal disease.  The left kidney is small in size, and the right kidney measures near the lower limits of normal in size. 2.  Negative for hydronephrosis.   Labs Reviewed: BUN: 34 (06/21/2010)   Cr: 1.65 (06/21/2010)    Hgb: 11.3 (05/03/2009)   Hct: 34.5 (05/03/2009)   Ca++: 9.7 (06/21/2010)    TP: 6.9 (02/08/2010)   Alb: 4.3 (02/08/2010)  Problem # 3:  NEUROPATHY (ICD-355.9) we discussed altering walking mechanics to avoid falls she has hx of foot drop  Complete Medication List: 1)  Lovastatin 40 Mg Tabs (Lovastatin) .... Take 1 by mouth at bedtime 2)  Multivitamins Tabs (Multiple vitamin) .... Take 1 by mouth qd 3)  Prilosec 20 Mg Cpdr (Omeprazole) .... Take 1 by mouth as needed 4)  Nitrostat 0.4 Mg Subl (Nitroglycerin) .... Take prn 5)  Carvedilol 6.25 Mg Tabs (Carvedilol) .... One by mouth two times a day 6)  Fenofibrate 54 Mg Tabs (Fenofibrate) .... Take 1 tablet by mouth once a day 7)  Lantus Solostar 100 Unit/ml Soln (Insulin glargine) .... 5-10 units once daily 8)  Relion Pen Needles 31g X 8 Mm Misc (Insulin pen needle) .... Use once daily 9)  Lisinopril-hydrochlorothiazide 10-12.5 Mg Tabs (Lisinopril-hydrochlorothiazide) .... One half tab by mouth once daily 10)  Freestyle Lite Test Strp (Glucose blood) .... Use to check blood sugar two times a day ( diabetes 250.00) 11)  Triamcinolone Acetonide 0.1 % Crea (Triamcinolone acetonide) .... Apply two times a day x 1 week 12)  Freestyle Lancets Misc (Lancets) .... Use to test blood sugar two times a day ( diabetes 250.00) 13)  Freestyle Freedom Lite W/device Kit (Blood glucose monitoring suppl) 14)  Aspirin 81 Mg Tabs (Aspirin) .... 2 tablets by mouth once daily 15)  Novolog Flexpen 100 Unit/ml Soln (Insulin aspart) .... 5 to 10 units before breakfast and  lunch  Patient  Instructions: 1)  Please schedule a follow-up appointment in 3 months. 2)  BMP prior to visit, ICD-9:  250.02 3)  HbgA1C prior to visit, ICD-9: 250.02 4)  Please return for lab work one (1) week before your next appointment.  Prescriptions: LANTUS SOLOSTAR 100 UNIT/ML SOLN (INSULIN GLARGINE) 5-10 units once daily  #1 month x 3   Entered and Authorized by:   D. [name] DO   Signed by:   D. [name] DO on 06/27/2010   Method used:   Electronically to        [facility] (retail)       [phone]  [place], [place]  [address]       Ph: [phone]       Fax: [phone]   RxID:   [id number] NOVOLOG FLEXPEN 100 UNIT/ML SOLN (INSULIN ASPART) 5 to 10 units before breakfast and  lunch  #1 month x 3   Entered and Authorized by:   D. [name] DO   Signed by:   D. [name] DO on 06/27/2010   Method used:   Electronically to        [facility] (retail)       [phone]       [place], [place]  [address]       Ph: [phone]       Fax: [phone]   RxID:   [id number]    Orders Added: 1)  Est. Patient Level III [[address]]    Current Allergies (reviewed today): ! SULFA ! * ACTOS

## 2010-08-02 ENCOUNTER — Encounter: Payer: Self-pay | Admitting: Internal Medicine

## 2010-08-04 LAB — GLUCOSE, CAPILLARY: Glucose-Capillary: 124 mg/dL — ABNORMAL HIGH (ref 70–99)

## 2010-08-09 ENCOUNTER — Telehealth: Payer: Self-pay | Admitting: *Deleted

## 2010-08-09 DIAGNOSIS — E119 Type 2 diabetes mellitus without complications: Secondary | ICD-10-CM

## 2010-08-09 NOTE — Telephone Encounter (Signed)
Call returned to patient at [phone], she states her blood sugar this morning  was 160, she has been using 5-10 units of Novolog. Since her last office visit with Dr Artist Pais her low blood sugar has been 140 and highest 170. She is using  Lantus 8-10 units at night.

## 2010-08-09 NOTE — Telephone Encounter (Signed)
Advise patient to increase insulin as per Dr. Olegario Messier recommendation: she should increase her lantus insulin by 1 unit each night until her fasting glucose in the morning is in the range of 100-110. Her glucoses are not at dangerous levels, and nothing urgent needs to be done for them, but pt should discuss more detailed dosing of her novolog with Dr. Artist Pais.

## 2010-08-09 NOTE — Telephone Encounter (Signed)
Patient called and left voice message stating her blood sugars have been elevated.She state she was taken off of glyburide and advised to extra insulin if her sugar was elevated.

## 2010-08-10 MED ORDER — INSULIN PEN NEEDLE 31G X 8 MM MISC: 1.0000 | Freq: Two times a day (BID) | Status: DC

## 2010-08-10 NOTE — Telephone Encounter (Signed)
Call placed to patient at [phone], she was informed per Dr Milinda Cave instructions. Patient states she was given a Rx for Levermir, but did , not receive a Rx for pen needles for injection, she is also requesting samples of Lantus. She was informed samples of Lantus would be left at front desk for patient pick up, and pen needles would be sent to pharmacy. Patient has verbalized a understanding and agrees as instructed

## 2010-08-21 ENCOUNTER — Encounter: Payer: Self-pay | Admitting: Internal Medicine

## 2010-08-28 ENCOUNTER — Emergency Department (INDEPENDENT_AMBULATORY_CARE_PROVIDER_SITE_OTHER): Payer: Medicare Other

## 2010-08-28 ENCOUNTER — Emergency Department (HOSPITAL_BASED_OUTPATIENT_CLINIC_OR_DEPARTMENT_OTHER)
Admission: EM | Admit: 2010-08-28 | Discharge: 2010-08-28 | Disposition: A | Discharge status: Home / Self Care | Payer: Medicare Other | Attending: Emergency Medicine | Admitting: Emergency Medicine

## 2010-08-28 ENCOUNTER — Ambulatory Visit (INDEPENDENT_AMBULATORY_CARE_PROVIDER_SITE_OTHER): Payer: Medicare Other | Admitting: Internal Medicine

## 2010-08-28 ENCOUNTER — Telehealth: Payer: Self-pay | Admitting: Internal Medicine

## 2010-08-28 ENCOUNTER — Encounter: Payer: Self-pay | Admitting: Internal Medicine

## 2010-08-28 VITALS — BP 110/56 | HR 70 | Temp 98.2°F | Resp 18 | Wt 180.0 lb

## 2010-08-28 DIAGNOSIS — E119 Type 2 diabetes mellitus without complications: Secondary | ICD-10-CM

## 2010-08-28 DIAGNOSIS — Z9089 Acquired absence of other organs: Secondary | ICD-10-CM

## 2010-08-28 DIAGNOSIS — I2510 Atherosclerotic heart disease of native coronary artery without angina pectoris: Secondary | ICD-10-CM | POA: Insufficient documentation

## 2010-08-28 DIAGNOSIS — K5730 Diverticulosis of large intestine without perforation or abscess without bleeding: Secondary | ICD-10-CM

## 2010-08-28 DIAGNOSIS — R109 Unspecified abdominal pain: Secondary | ICD-10-CM

## 2010-08-28 DIAGNOSIS — R079 Chest pain, unspecified: Secondary | ICD-10-CM

## 2010-08-28 DIAGNOSIS — R071 Chest pain on breathing: Secondary | ICD-10-CM | POA: Insufficient documentation

## 2010-08-28 DIAGNOSIS — I10 Essential (primary) hypertension: Secondary | ICD-10-CM | POA: Insufficient documentation

## 2010-08-28 DIAGNOSIS — R05 Cough: Secondary | ICD-10-CM

## 2010-08-28 DIAGNOSIS — R1011 Right upper quadrant pain: Secondary | ICD-10-CM | POA: Insufficient documentation

## 2010-08-28 LAB — DIFFERENTIAL
Basophils Absolute: 0 10*3/uL (ref 0.0–0.1)
Basophils Relative: 0 % (ref 0–1)
Eosinophils Absolute: 0.4 10*3/uL (ref 0.0–0.7)
Eosinophils Relative: 5 % (ref 0–5)
Lymphocytes Relative: 33 % (ref 12–46)
Lymphs Abs: 2.5 10*3/uL (ref 0.7–4.0)
Monocytes Absolute: 1 K/uL (ref 0.1–1.0)
Monocytes Relative: 13 % — ABNORMAL HIGH (ref 3–12)
Neutro Abs: 3.7 K/uL (ref 1.7–7.7)
Neutrophils Relative %: 49 % (ref 43–77)

## 2010-08-28 LAB — COMPREHENSIVE METABOLIC PANEL WITH GFR
ALT: 16 U/L (ref 0–35)
CO2: 21 meq/L (ref 19–32)
Calcium: 9.7 mg/dL (ref 8.4–10.5)
Chloride: 106 meq/L (ref 96–112)
GFR calc non Af Amer: 26 mL/min — ABNORMAL LOW (ref >60)
Glucose, Bld: 198 mg/dL — ABNORMAL HIGH (ref 70–99)
Sodium: 139 meq/L (ref 135–145)
Total Bilirubin: 0.2 mg/dL — ABNORMAL LOW (ref 0.3–1.2)

## 2010-08-28 LAB — CBC
HCT: 33.8 % — ABNORMAL LOW (ref 36.0–46.0)
Hemoglobin: 11.5 g/dL — ABNORMAL LOW (ref 12.0–15.0)
MCH: 30.2 pg (ref 26.0–34.0)
MCHC: 34 g/dL (ref 30.0–36.0)
MCV: 88.7 fL (ref 78.0–100.0)
Platelets: 262 10*3/uL (ref 150–400)
RBC: 3.81 MIL/uL — ABNORMAL LOW (ref 3.87–5.11)
RDW: 12.4 % (ref 11.5–15.5)
WBC: 7.6 10*3/uL (ref 4.0–10.5)

## 2010-08-28 LAB — URINALYSIS, ROUTINE W REFLEX MICROSCOPIC
Bilirubin Urine: NEGATIVE
Ketones, ur: NEGATIVE mg/dL
Nitrite: NEGATIVE
Urobilinogen, UA: 0.2 mg/dL (ref 0.0–1.0)
pH: 6 (ref 5.0–8.0)

## 2010-08-28 LAB — COMPREHENSIVE METABOLIC PANEL
AST: 20 U/L (ref 0–37)
Albumin: 3.9 g/dL (ref 3.5–5.2)
Alkaline Phosphatase: 51 U/L (ref 39–117)
BUN: 37 mg/dL — ABNORMAL HIGH (ref 6–23)
Creatinine, Ser: 1.9 mg/dL — ABNORMAL HIGH (ref 0.4–1.2)
GFR calc Af Amer: 31 mL/min — ABNORMAL LOW (ref >60)
Potassium: 3.9 mEq/L (ref 3.5–5.1)
Total Protein: 6.8 g/dL (ref 6.0–8.3)

## 2010-08-28 LAB — D-DIMER, QUANTITATIVE: D-Dimer, Quant: 0.26 ug/mL-FEU (ref 0.00–0.48)

## 2010-08-28 LAB — URINE MICROSCOPIC-ADD ON

## 2010-08-28 LAB — LIPASE, BLOOD: Lipase: 55 U/L (ref 11–59)

## 2010-08-28 MED ORDER — LOVASTATIN 40 MG PO TABS: 40.0000 mg | ORAL_TABLET | Freq: Every day | ORAL | Status: DC

## 2010-08-28 NOTE — Patient Instructions (Signed)
Please go the [facility] emergency room for further evaluation of your acute abdominal pain.

## 2010-08-28 NOTE — Assessment & Plan Note (Signed)
77 year old female with acute 10 out of 10 right upper quadrant abdominal pain of unclear etiology. Question nephrolithiasis vs other acute process Discussed clinical case with the ER physician.  I suggest additional workup including CT of abdomen and pelvis. Pt agrees to proceed to ER for further evaluation

## 2010-08-28 NOTE — Telephone Encounter (Signed)
Refill-lovastatin 40mg  tab. Take one tablet by mouth at bedtime. Qty 30. Last fill 4.1.12

## 2010-08-28 NOTE — Assessment & Plan Note (Signed)
Exacerbated by acute abdominal process. Continue insulin regimen Pt also to restart glyburide therapy

## 2010-08-28 NOTE — Progress Notes (Signed)
Subjective:    Patient ID: [name], female    DOB: 1933/08/17, 77 y.o.   MRN: [id number]  Abdominal Pain This is a new problem. The current episode started 1 to 4 weeks ago. The onset quality is sudden. The problem occurs constantly. The problem has been rapidly worsening. The pain is located in the RUQ and right flank. The pain is at a severity of 10/10. The pain is severe. The quality of the pain is sharp. The abdominal pain does not radiate. Pertinent negatives include no constipation, diarrhea, dysuria, fever, hematochezia, melena or vomiting. The pain is aggravated by certain positions. The pain is relieved by being still. She has tried nothing for the symptoms.  She has previous history of remote cholecystectomy and appendectomy.  Her abdominal complaints are not related to food intake.  Patient also has noticed her blood sugar has been higher than normal.  Review of Systems  Constitutional: Negative for fever.  Gastrointestinal: Positive for abdominal pain. Negative for vomiting, diarrhea, constipation, melena and hematochezia.  Genitourinary: Negative for dysuria.   Past Medical History  Diagnosis Date  . Hypertension   . Hyperlipidemia   . GERD (gastroesophageal reflux disease)   . Anemia   . CAD (coronary artery disease)     status Taxus drug-eluding stent to the LAD December 2006  . Edema     lower extremity with normal BNP  . Encounter for long-term (current) use of medications   . Hyperkalemia   . Glossitis   . Leg pain, left   . Routine general medical examination at a health care facility   . Chronic renal disease     mild  . Pyuria   . Anemia, iron deficiency   . UTI (urinary tract infection)   . Pain in foot     bilateral  . Osteopenia   . Diverticulosis of colon   . Diabetes mellitus type II   . Polyneuropathy     History   Social History  . Marital Status: Married    Spouse Name: N/A    Number of Children: N/A  . Years of Education: N/A     Occupational History  . Not on file.   Social History Main Topics  . Smoking status: Not on file  . Smokeless tobacco: Not on file  . Alcohol Use: Not on file  . Drug Use: Not on file  . Sexually Active: Not on file   Other Topics Concern  . 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 Cholecystectomy   . Total abdominal hysterectomy 1969    no BSO  . Ptca     stent to LAD in 03/2005, recatheterization inJanuarry 2007 for recurrent chest pain  that showed a patent LAD stent with normal LV function  . Esophagogastroduodenoscopy 04/15/2006  . Cardiovascular stress test 02/24/2006  . Electrocardiogram 06/18/2006    Family History  Problem Relation Age of Onset  . Heart disease Mother   . Heart disease Sister     Allergies  Allergen Reactions  . Pioglitazone     REACTION: Edema  . Sulfonamide Derivatives     Current Outpatient Prescriptions on File Prior to Visit  Medication Sig Dispense Refill  . Insulin Pen Needle 31G X 8 MM MISC 1 each by Does not apply route 2 (two)  times daily before a meal.  100 each  1    BP 110/56  Pulse 70  Temp(Src) 98.2 F (36.8 C) (Oral)  Resp 18  Wt 180 lb (81.647 kg)  SpO2 97%       Objective:   Physical Exam  Constitutional: She is oriented to person, place, and time. She appears well-developed and well-nourished. She appears distressed.       Nontoxic  HENT:  Head: Normocephalic and atraumatic.  Cardiovascular: Normal rate, regular rhythm and normal heart sounds.  Exam reveals no gallop.   No murmur heard. Pulmonary/Chest: Effort normal and breath sounds normal. She has no wheezes. She has no rales.       No obvious rash  Abdominal: Soft.       Significant right upper quadrant tenderness, no guarding no rebound No mass.  Neurological: She is oriented to person, place, and time. No  cranial nerve deficit.  Skin: Skin is dry. She is not diaphoretic.  Psychiatric: She has a normal mood and affect. Her behavior is normal.          Assessment & Plan:

## 2010-08-28 NOTE — Telephone Encounter (Signed)
Received refill request for Lovastatin from [facility] [address]. Had [facility] [address] on file. Confirmed with pt that she uses [facility] on [address]. Refill sent to [place] location.

## 2010-09-03 ENCOUNTER — Encounter: Payer: Self-pay | Admitting: Internal Medicine

## 2010-09-03 ENCOUNTER — Ambulatory Visit (INDEPENDENT_AMBULATORY_CARE_PROVIDER_SITE_OTHER): Payer: Medicare Other | Admitting: Internal Medicine

## 2010-09-03 VITALS — BP 110/60 | HR 60 | Temp 97.6°F | Resp 18 | Wt 178.0 lb

## 2010-09-03 DIAGNOSIS — Z0289 Encounter for other administrative examinations: Secondary | ICD-10-CM

## 2010-09-03 DIAGNOSIS — R109 Unspecified abdominal pain: Secondary | ICD-10-CM

## 2010-09-03 MED ORDER — INSULIN GLARGINE 100 UNIT/ML ~~LOC~~ SOLN: 10.0000 [IU] | Freq: Every day | SUBCUTANEOUS | Status: DC

## 2010-09-03 NOTE — Patient Instructions (Addendum)
Keep your next follow up appointment 

## 2010-09-11 NOTE — Assessment & Plan Note (Signed)
[facility]                            CARDIOLOGY OFFICE NOTE   [name], MOHAMUD                   MRN:          [id number]  DATE:12/15/2006                            DOB:          05-05-1933    [name] returns today for [name] 16-month follow up.  [name] is a very  pleasant 77 year old Caucasian female with known history of coronary  artery disease.  [name] last saw Dr. Gala Romney in February of this year, at  which time [name] was doing quite well.  Denied any problems.  [name] returns  today.  [name] states [name] is doing quite well.  No episodes suggestive of  angina.  Has not had to take any nitroglycerin.  [name] continues to follow  up with Dr. Romero Belling regarding [name] diabetes.  [name] states [name] CBGs  run between 100 and 150.  [name] complains of ongoing peripheral neuropathy  in [name] hands and feet, occasional unsteadiness, and has fallen  occasionally without any injuries.  Stats compliance with medications.  No problems with side effects.  [name] very rarely walks on [name] treadmill,  but states [name] has to hold on with both hands secondary to [name] unsteady  gait and neuropathy.  [name] denies any episodes of dizziness, presyncope  or syncopal episodes.  Last blood work (lipid panel) was done in May of  this year.  [name] goal LDL had been below 70.  At that time, [name] was 58.8.  [name] triglycerides were elevated at 231.  [name] states compliance with [name]  lovastatin 80 mg daily.   PAST MEDICAL HISTORY:  1. Coronary artery disease status post drug-eluding stent to the LAD      in December of 2006.  Most recent cardiac catheterization in      January of 2007 for recurrent chest pain, at which time the patient      had a patent LAD stent and normal left ventricular function.  2. Type 2 diabetes with peripheral neuropathy.  3. Lower extremity edema with normal BNP, resolved off Actos.  4. Hypertension.  5. Hyperlipidemia.  6. Iron-deficiency anemia secondary to peptic ulcer  disease.  7. Gout.  8. Gallstones status post cholecystectomy.   REVIEW OF SYSTEMS:  As stated above; otherwise negative.   CURRENT MEDICATIONS:  1. Glucophage XR 500 mg b.i.d.  2. Coreg 6.25 mg b.i.d.  3. Plavix 75 daily.  4. Lovastatin 40 mg two tablets q.h.s.  5. Aspirin 81 mg daily.  6. Multivitamin daily.  7. HCTZ 25 mg daily.  8. Prilosec 20 mg daily.  9. Lisinopril 20 mg daily.  10.Glyburide 2.5 mg daily.   PHYSICAL EXAMINATION:  VITAL SIGNS:  Weight 190.  Blood pressure in the  right arm by [place] of 131/69.  Manual blood pressure in the left arm  100/62 with a heart rate of 81.  GENERAL:  [name] is in no acute distress.  [name] is a lovely 72-year-  old Caucasian female.  HEENT:  Unremarkable.  NECK:  Supple.  No jugular venous distention.  No bruits.  CARDIOVASCULAR:  Regular rate and rhythm without murmurs, rubs, or  gallops.  LUNGS:  Clear to auscultation bilaterally.  ABDOMEN:  Soft, nontender.  Positive bowel sounds.  EXTREMITIES:  Lower extremities without clubbing or cyanosis.  [name] has a  trace of edema bilaterally.  NEUROLOGIC:  Alert and oriented x3.  Up ambulating without assistance.   IMPRESSION:  1. Coronary artery disease, currently stable.  Continue current      medications.  2. Hypertension.  Blood pressure well controlled.  3. Hyperlipidemia.  Tolerating lovastatin without problems.  4. Elevated triglycerides in the setting of diabetes.   FOLLOWUP:  We will see the patient back in 6 months for routine follow  up.      Dorian Pod, ACNP  Electronically Signed      Bevelyn Buckles. Bensimhon, MD  Electronically Signed   MB/MedQ  DD: 12/15/2006  DT: 12/15/2006  Job #: [id number]   cc:   Gregary Signs A. Everardo All, MD

## 2010-09-11 NOTE — Assessment & Plan Note (Signed)
[name]                            CARDIOLOGY OFFICE NOTE   [name], [name]                   MRN:          [id number]  DATE:11/30/2007                            DOB:          1933-11-20    PRIMARY CARE PHYSICIAN:  Michaela A. Everardo All, MD.   INTERVAL HISTORY:  [name] is a delightful 77 year old woman who  returns for routine followup.  She has a history of coronary artery  disease status post Taxus drug-eluting stent to the LAD in December  2006, re-look catheterization in January 2007 showed patent LAD stent  with normal LV function.  She also has history of diabetes, lower  extremity edema, hypertension, hyperlipidemia, and neuropathy.   From cardiac point-of-view, she is doing well.  She denies any chest  pain or significant dyspnea.  Her main issue is that she has now fallen  five times in the past 2 months.  She says that she just cannot feel her  feet and legs any more and she stumbles.  She has not had any  arrhythmias.  She saw Dr. Kelli Hammond in Neurology few years back  and was told she had a drop foot, but she has not followed up with him.  She does note that she has just mild occasional lower extremity edema.  No orthopnea or PND.  Blood pressures at home having been from the 90-  120 range systolically.   CURRENT MEDICATIONS:  1. Glucophage 1000 b.i.d.  2. Coreg 12.5 b.i.d.  3. Plavix 75 a day.  4. Lovastatin 80 a day.  5. Aspirin 81 a day.  6. Multivitamin.  7. HCTZ 25 a day.  8. Lisinopril 20 a day.  9. Glyburide 5 a day.   PHYSICAL EXAMINATION:  GENERAL:  She is in no acute distress ambulatory  on the clinic without any respiratory difficulty.  VITAL SIGNS:  Blood pressures initially was 118/62, on my recheck  144/74, heart rate 79, weight is 192, which is down 6 pounds.  HEENT:  Normal.  NECK:  There is no JVD.  Carotid are 2+ bilaterally without bruits.  There is no lymphadenopathy or thyromegaly.  CARDIAC:  She has a regular rate and rhythm.  No murmurs, rubs, or  gallops.  LUNGS:  Clear.  ABDOMEN:  Obese, nontender, nondistended, no hepatosplenomegaly, no  bruits, no masses.  Good bowel sounds.  EXTREMITIES:  Warm with no cyanosis, clubbing.  There is trace edema.  She has no sensation basically from the mid calf down.  I have moved her  toes back and forth, and she cannot tell, which toe I am touching, if I  am moving them up and down.  Terrible proprioception.  There is no  pronator drift.   EKG shows normal sinus rhythm poor R-wave progression, rate is 79.  No  ST-T wave abnormalities.   ASSESSMENT AND PLAN:  1. Coronary artery disease.  Status post left anterior descending      (coronary artery) stent this is stable.  Continue Plavix.  2. Hypertension.  Blood pressure is elevated here, but well controlled  at home.  3. Hyperlipidemia.  She is followed by Dr. Everardo Hammond, given her coronary      artery disease.  Goal LDL is less than 70.  4. Falls.  She has evidence of severe neuropathy.  I have asked her to      go see PT/OT for further evaluation and possible either cane or a      walker.     Michaela Buckles. Bensimhon, MD  Electronically Signed    DRB/MedQ  DD: 11/30/2007  DT: 12/01/2007  Job #: [id number]

## 2010-09-11 NOTE — Op Note (Signed)
NAMEBRANNON, Hammond NO.:  [id number]   MEDICAL RECORD NO.:  [id number]          PATIENT TYPE:  EMS   LOCATION:  ED                           FACILITY:  [facility]   PHYSICIAN:  Feliberto Gottron. Turner Daniels, M.D.   DATE OF BIRTH:  1933/09/09   DATE OF PROCEDURE:  11/27/2008  DATE OF DISCHARGE:  11/27/2008                               OPERATIVE REPORT   PREOPERATIVE DIAGNOSIS:  Right great toe proximal phalanx open fracture  with nailbed laceration.   POSTOPERATIVE DIAGNOSIS:  Right great toe proximal phalanx open fracture  with nailbed laceration.   PROCEDURE:  Irrigation and debridement, open reduction, and fixation  using a vertical mattress 3-0 nylon suture.   SURGEON:  Feliberto Gottron.  Turner Daniels, M.D.   FIRST ASSISTANT:  None.   ANESTHESIA:  None.   INDICATIONS FOR PROCEDURE:  A 77 year old woman with dense bilateral  peripheral neuropathy secondary to a 20-year history of non-insulin-  dependent diabetes who stubbed her toe on the morning of November 27, 2008  and sustained an open fracture of the right great toe through the base  of the nailbed with the proximal fragment protruding through the wound  and on top of the nailbed.  The laceration extended 180 degrees across  the dorsum of the foot, and she was taken to [facility],  where x-rays were taken confirming the diagnosis, and the foot was  placed in normal saline until I could evaluate her.  When I saw her in  the emergency room, she had true dense neuropathy of both feet and  reported no pain, even when I manipulated her toe, examining her.  Based  on this finding, we discussed options, and it was her desire to go ahead  and perform irrigation and debridement and open reduction with suture  fixation in the emergency room since anesthetic would not really be  required, based on her level of neuropathy.  Risks and benefits of  surgery were discussed, questions answered.   DESCRIPTION OF PROCEDURE:  The  patient was identified by arm band, and a  sterile field set up using chucks and sterile drapes and hydrogen  peroxide for both a cleansing and sterilization agent along with normal  saline.  Traction was placed on the fracture site, allowing [place] to get in  there was some up pickups and small scissors.  Blood clot was removed.  Using a bulb syringe, we thoroughly irrigated the wound out with  hydrogen peroxide and normal saline solution.  There was little, if any  bleeding noted, and the wound was clean when we were finished.  Under  direct vision, we then reduced the fracture beneath the nail bed,  obtaining a baseline anatomic reduction, which was then held with 3  vertical mattress 3-0 nylon sutures, 1 on either side of the nailbed and  1 through the nail bed and brought up through the skin fold proximally  in a vertical mattress fashion.  A dressing of 4x4s in a toe spica  fashion  was then applied, followed by some 2-inch roller gauze and 2-inch Coban  and a  postoperative shoe.  The patient tolerated the procedure well,  received 1 gram of Rocephin IM perioperatively and then was discharged  home on Keflex 500 mg p.o. b.i.d. to return to see me in the office in 2  to 3 days.      Feliberto Gottron. Turner Daniels, M.D.  Electronically Signed     FJR/MEDQ  D:  11/27/2008  T:  11/27/2008  Job:  [id number]

## 2010-09-11 NOTE — Assessment & Plan Note (Signed)
[facility]                            CARDIOLOGY OFFICE NOTE   [name], [name]                   MRN:          [id number]  DATE:06/22/2007                            DOB:          [birth year]    INTERVAL HISTORY:  [name] is a delightful 77 year old woman with  history of coronary artery disease status post Taxus drug-eluting stent  to the LAD in December 2006, recatheterization in January 2007 for  recurrent chest pain that showed a patent LAD stent with normal LV  function.  She also has a history of type 2 diabetes complicated by  neuropathy, hypertension, hyperlipidemia and gout.   She returns today for routine followup.  She is doing great.  She does  have some problems with her neuropathy which limits her walking a bit,  but she still tries to do what she can.  Denies any chest pain or  shortness of breath.  No problems with heart failure.  She has been  compliant with her medications.  She has not been checking her blood  pressure routinely.  She takes Plavix every day.   CURRENT MEDICATIONS:  1. Glucophage XR 500 b.i.d.  2. Coreg 6.25 b.i.d.  3. Plavix 75 a day.  4. Lovastatin 40 b.i.d.  5. Aspirin 81.  6. Multivitamin.  7. HCTZ 25.  8. Prilosec 20.  9. Lisinopril 20.  10.Glyburide 2.5 a day.   PHYSICAL EXAMINATION:  GENERAL:  She is well-appearing in no acute  distress.  She ambulates around the clinic without any respiratory  difficulty.  VITAL SIGNS:  Blood pressure is 138/84, heart rate is 73.  HEENT:  Normal.  NECK:  Supple.  No JVD.  Carotids are 2+ bilaterally without any bruits.  There is no lymphadenopathy or thyromegaly.  CARDIAC:  PMI is nondisplaced.  She has a regular and rhythm with soft  2/6 systolic ejection murmur at the right sternal border, S2 is  preserved.  LUNGS:  Clear.  ABDOMEN:  Obese, nontender, nondistended.  No hepatosplenomegaly, no  bruits, no masses.  Good bowel sounds.  EXTREMITIES:   Warm with no cyanosis, clubbing or edema.  No rash.  NEURO:  Alert and oriented x3.  Cranial nerves II-XII intact.  Moves all  4 extremities without difficulty.  Affect is pleasant.   DIAGNOSTICS:  EKG shows normal sinus rhythm with a rate of 73, no ST/T  wave abnormalities.   ASSESSMENT/PLAN:  1. Coronary artery disease is stable.  No evidence of ischemia.      Continue current medications.  Lifelong Plavix.  2. Chronic hypertension.  Blood pressure remains elevated.  We will      increase her Coreg to 12.5 b.i.d.  She will get a blood pressure      cuff to keep closer tabs on it.  3. Diabetes and hyperlipidemia as per Dr. Everardo All.  Goal LDL is less      than 70.   DISPOSITION:  We will see her back in 6 months for routine followup.     Bevelyn Buckles. Bensimhon, MD  Electronically Signed    DRB/MedQ  DD:  06/22/2007  DT: 06/23/2007  Job #: [id number]

## 2010-09-14 NOTE — H&P (Signed)
[name], [name] NO.:  [id number]   MEDICAL RECORD NO.:  [id number]          PATIENT TYPE:  INP   LOCATION:  6524                         FACILITY:  [facility]   PHYSICIAN:  Lorain Childes, M.D. LHCDATE OF BIRTH:  1933/12/29   DATE OF ADMISSION:  05/13/2005  DATE OF DISCHARGE:                                HISTORY & PHYSICAL   PRIMARY CARE PHYSICIAN:  Dr. Romero Belling   PRIMARY CARDIOLOGIST:  Dr. Nicholes Mango   CHIEF COMPLAINT:  Chest pain.   HISTORY OF PRESENT ILLNESS:  The patient is a very pleasant 77 year old  female with coronary artery disease,status post PCI of the LAD with a Taxus  drug-eluting stent on April 01, 2005, who presents to the  ER with chest  pain.  The patient states she has had occasional chest discomfort since the  stent was placed in December, she has taken nitroglycerin on two separate  occasions prior to yesterday.  Yesterday around 12:30 p.m. she had some mild  discomfort.  She took one sublingual nitroglycerin which helped her  symptoms.  Unfortunately, they recurred in the afternoon and then again  around 8:30 p.m.  She took two sublingual nitroglycerin at 8:30 and then  fell asleep.  Around 2 a.m. she was awakened with chest pain.  She rated it  6/10.  She took sublingual nitroglycerin that did not relieve her pain.  __________ she then came to the emergency room and received three sublingual  nitroglycerin and now is chest pain-free.  She reports that she has been  compliant with all her medications since discharge including her Plavix.  She does report that these symptoms are similar to her GERD symptoms and it  is hard for her to discern whether it is cardiac versus GERD as her symptoms  with her chest pain requiring stent placement were very similar.   PAST MEDICAL HISTORY:  1.  CAD status post PCI of her LAD on April 01, 2005 with a TAXUS 2.5 x 24      mm stent.  Her EF was noted to be normal.  Her RCA had 40% mid  and      posterolateral had 40-50% stenosis.  Her left main was okay.  Her left      circumflex had minor irregularities.  Her LAD had 70% ostial.  It was      noted to be a tortuous vessel followed by 80% stenosis.  She had a D1      with a 90% stenosis but the vessel was noted to be small.  2.  Hypotension.  3.  Dyslipidemia.  4.  Diabetes.  5.  Obesity.  6.  Status post cholecystectomy.   MEDICATIONS:  1.  Toprol XL 25 mg p.o. daily.  2.  Aspirin 81 mg p.o. daily.  3.  Plavix 75 mg p.o. daily.  4.  Zestril 10 mg p.o. daily.  5.  Lovastatin 80 mg p.o. q.h.s.  6.  Actos 30 mg p.o. daily.  7.  Lasix 20 mg p.o. daily.  8.  Multivitamin one tablet p.o. daily.  9.  Sublingual nitroglycerin  p.r.n.  10. Glyburide 1.5 mg p.o. q.a.m.Marland Kitchen  11. Metformin 1000 mg p.o. b.i.d.   ALLERGIES:  SULFA.   SOCIAL HISTORY:  She lives in [place] with her husband.  She is married.  She is retired.  She denies any tobacco or alcohol.  No drugs.   FAMILY HISTORY:  Her mom died at the age of 66 with an MI.  Father died at  the age of 12 with an MI.  Sister had an MI at age 84.  Brother had a motor  vehicle accident at the age of 67 and passed away.   REVIEW OF SYSTEMS:  Denies any fever, chills, or headache, visual changes.  No shortness of breath, PND, orthopnea, lower extremity edema.  No coughing  or wheezing.  No urinary symptoms.  No bright red blood per rectum.  No  melena.  No hematochezia.  All other systems are negative.   PHYSICAL EXAMINATION:  VITAL SIGNS:  Today afebrile, pulse 78, respirations  16, blood pressure 129/67, saturating 94% on 2 L nasal cannula.  GENERAL:  She is a very pleasant female comfortable, in no acute distress.  HEENT:  Normocephalic, atraumatic.  Oropharynx is clear.  NECK:  Supple.  She has no carotid bruits.  2+ carotid upstrokes.  Her JVP  is normal.  CARDIOVASCULAR:  S1, split S2.  Regular rate and rhythm.  PMI is  nondisplaced.  Her pulses are 2+ and  equal.  LUNGS:  Clear to auscultation bilaterally.  No evidence of rales.  ABDOMEN:  Soft.  Positive bowel sounds.  Nontender.  No organomegaly.  EXTREMITIES:  Trace pedal edema.  Her pulses are intact.  NEUROLOGIC:  Nonfocal.   Chest x-ray is pending.  EKG:  Rate 74, sinus rhythm, normal axis.  PR  interval 132 milliseconds, QRS interval is 79 milliseconds, QTC is 430  milliseconds.  She has no Q-wave and no ST changes, no hypertrophy.  Her EKG  is similar to one dated April 02, 2005.   LABORATORY DATA:  Point of care cardiac enzymes with a CK-MB of 2.5,  troponin of less than 0.05, myoglobin 90.   ASSESSMENT/PLAN:  This is a very pleasant 77 year old female with known  coronary artery disease status post PCI of her left anterior descending who  now presents with chest pain.  1.  Coronary artery disease.  Patient will be admitted on telemetry.      Cardiac enzymes will be cycled and her EKGs will be monitored closely.      Will plan for the left-sided catheterization to assess patency of her      stents.  She reports complete compliance with all of her medications      including Plavix.  Will continue her current dose of beta blocker, ACE      inhibitor and Statin.  Will start her on Lovenox and cover with      nitroglycerin drip.  2.  Gastroesophageal reflux disease.  Will start her on Protonix because her      symptoms are difficult to discern whether they are cardiac versus      gastrointestinal.  3.  Diabetes.  Will continue her on her Glyburide and Actos.  Will cover her      with sliding scale insulin.  We are holding her Metformin due to the      heart catheterization.           ______________________________  Lorain Childes, M.D. [facility]    CGF/MEDQ  D:  05/13/2005  T:  05/13/2005  Job:  [id number]

## 2010-09-14 NOTE — Cardiovascular Report (Signed)
NAMEADALENE, GULOTTA            ACCOUNT NO.:  [id number]   MEDICAL RECORD NO.:  [id number]          PATIENT TYPE:  INP   LOCATION:  6523                         FACILITY:  [facility]   PHYSICIAN:  Arturo Morton. Riley Kill, M.D. [facility] OF BIRTH:  04/12/1934   DATE OF PROCEDURE:  04/01/2005  DATE OF DISCHARGE:                              CARDIAC CATHETERIZATION   INDICATIONS FOR PROCEDURE:  Ms. Gamino is a 77 year old white female with  a history of diabetes and hypertension.  She has had several episodes of  prolonged chest pain occurring both at rest and with exertion.  She was seen  by Dr. Gala Romney after she went to Dr. George Hugh office for a routine office  visit.  Dr. Gala Romney promptly admitted her and recommended cardiac  catheterization.  She was brought to the cath lab for further evaluation and  treatment.   PROCEDURES:  1.  Left heart catheterization.  2.  Selective coronary arteriography.  3.  Selective left ventriculography.  4.  PTCA and stenting of the left anterior descending artery.   DESCRIPTION OF PROCEDURE:  The patient was brought to the catheterization  laboratory and prepped and draped in the usual fashion.  Through an anterior  puncture, the right femoral artery was easily entered on the first stick  without difficulty and an anterior puncture was used. A  6 French sheath was  initially placed.  ACT was checked.  Views of the left and right coronary  arteries were then obtained in multiple angiographic projections.  Central  aortic and left ventricular pressures were measured with pigtail.  Ventriculography was then performed in the RAO projection.  To briefly  summarize, the findings demonstrated a fairly high grade stenosis in the mid  portion of the left anterior descending artery with a moderate stenosis at  the ostium of the diagonal.  There was mild scattered irregularities of the  right coronary as noted in the report.  I called Dr. Arvilla Meres to  the  laboratory, and based upon the findings our feelings was that the patient  would likely benefit most from percutaneous intervention involving the LAD.  The next question then became drug eluting versus nondrug eluting.  Importantly, the lesion represented a fairly long lesion of small vessel  caliber and the patient was diabetic as well.  Based on this, drug eluting  would be optimal.  We brought the patient's daughter and husband in the  catheterization laboratory after review of the findings extensively with  them.  We also discussed this with the patient in detail.  We then had all  three of them together in the laboratory to review the findings and talk  about the various options which include coronary artery bypass graft  surgery, percutaneous intervention with or without DES, and medical therapy.  After an extensive discussion, the patient wished to proceed with  percutaneous intervention using drug eluting stent technology.  Importantly,  we tried to carefully explain the tradeoff between the various issues with  regard to the various stent platforms.  Following this, preparations were  made.  A glycoprotein inhibitor in the form of eptifibatide  was utilized as  well as unfractionated heparin at 50 units/kg.  ACTs were checked and found  to be appropriate for percutaneous intervention.  The patient was loaded  with 600 mg of oral clopidogrel.  Following this, we initially used a JL-4  but then changed for JL-3.5 6 [place] guiding catheter.  Angiographic views  were obtained.  We ended up using a traverse wire to get down the vessel.  Predilatations were done with a 2.25 x 15 Maverick balloon, and then  subsequently we were able to get down to 2.5 x 24 TAXUS drug eluting  platform.  This was deployed at approximately 13 to 14 atmospheres.  Post  dilatation was then performed with 2.75 x 12 Quantum Maverick balloon  throughout the course of the stent with careful attention paid to  keep the  high pressure balloon slightly within the edges.  Inflations were done up to  16 atmospheres in the mid portion of the stent.  There was no loss of side  branch nor septal loss.  The angiographic result was excellent.  Balloon  dilatation did reproduce the patient's symptoms that she has had over the  past few weeks.  Following this, all catheters removed and the femoral  sheath was sewn into place.  I have reviewed the entire angiograms with the  patient's husband and daughter.  She was taken to the holding area in  satisfactory clinical condition.   HEMODYNAMIC DATA:  1.  Central aortic pressure 161/74.  2.  Left ventricular pressure 150/10.  3.  No gradient on pullback across the aortic valve.   ANGIOGRAPHIC DATA:  1.  Ventriculography was done in the RAO projection.  Overall systolic      function was preserved.  No definite segmental wall motion abnormalities      were identified.  2.  The right coronary artery is a large caliber vessel with minor      calcification.  There was a 40% area of narrowing in the mid vessel.      The posterior descending has minor luminal irregularity.  The large      posterolateral branch has about 40 to 50% segmental plaquing in the bend      leading into the posterolateral branch but none of these appear to be      high grade or critical.  3.  The left main coronary artery is free of critical disease.  4.  The circumflex vessel has minor luminal irregularities in the mid      portion, but without critical stenosis.  5.  The left anterior descending artery provides moderate size first      diagonal branch that has an eccentric ostial to proximal stenosis of      about 70%.  Following this, there was tortuosity in the mid vessel      overlapping the septal and small second diagonal.  At this location is a      segmental plaque of about 80% that extends over about 15 to 18 mm. The     vessel at this location is about 2.5 to 2.6 mm.  The  diagonal itself has      about 90% ostial narrowing but is a really small tiny diagonal that is      not suitable for grafting.  The remainder of the distal LAD distally      tapers and then bifurcates distally into two smaller branches both of      which are without  critical narrowing but with minor luminal      irregularity.  Following the implantation of the 2.5 x 24 TAXUS drug      eluting stent, the stenosis was reduced to less than 0% residual luminal      narrowing.  There was a slight stepdown at the distal edge of the stent      but without evidence of stent edge dissection.  Runoff into the distal      vessel was excellent.   CONCLUSION:  1.  Well preserved left ventricular function.  2.  High grade stenosis of the left anterior descending coronary artery with      successful drug eluting stent implantation as described in the above      text.  3.  Residual stenosis of the diagonal and mild residual disease of the right      coronary artery as noted above.   RECOMMENDATIONS:  1.  The patient will be treated with aspirin and oral clopidogrel for      indefinite period.  2.  Follow-up will be with Arvilla Meres, M.D., to determine the optimal      time of continuation of long-term therapy.  3.  Aggressive risk factor reduction would be recommended given her native      vasculature.   DISCUSSION:  We discussed extensively with the patient and family the  decision making process in the light of recent information.  The patient's  lesion was somewhat segmental, small in caliber and she is diabetic.  Based  on this, drug eluting platform was thought to be the best option at this  time.  The various options were discussed with the patient and her immediate  family in detail.  We will have her follow up with Dr. Gala Romney in the  office.      Arturo Morton. Riley Kill, M.D. [facility]  Electronically Signed     TDS/MEDQ  D:  04/01/2005  T:  04/02/2005  Job:  [id number]   cc:   Arvilla Meres, M.D. [facility]  [facility]  [address]  [place]  [place] [address]   CV Lab

## 2010-09-14 NOTE — Assessment & Plan Note (Signed)
[facility]                              CARDIOLOGY OFFICE NOTE   [name], [name]                   MRN:          [id number]  DATE:02/12/2006                            DOB:          02/13/1934    PRIMARY CARE PHYSICIAN:  Dr. Romero Belling.   PATIENT IDENTIFICATION:  [name] is a delightful 77 year old woman, who  is here for routine follow up.   PROBLEM LIST:  1. Coronary artery disease, status post TAXUS drug-eluting stent to the      left anterior descending in December 2006.      a.     Relook catheterization in January 2007 for recurrent chest pain       showed a patent left anterior descending stent.  Normal left       ventricular function.  2. Type 2 diabetes with neuropathy.  3. Lower extremity edema with normal BNP.  4. Hypertension.  5. Hyperlipidemia.  6. Gallstones status post cholecystectomy.   CURRENT MEDICATIONS:  1. Plavix 75 daily.  2. Zestril 10 daily.  3. Multivitamin.  4. Glyburide 1.25 in the morning.  5. Glucophage XR 2 tabs b.i.d.  6. Toprol XL 25 daily.  7. Aspirin 325 daily.  8. Imdur 60 daily.  9. HCTZ 25 daily.  10.Lovastatin 80 daily.   ALLERGIES:  SULFA.   INTERVAL HISTORY:  [name] returns today for routine follow up.  She  notes that over the past week or two, she has had some mild chest pain which  she notices most when lying down.  It gets better with ambulation.  She has  not had any exertional chest pain whatsoever.  No radiation, nausea, or  diaphoresis.  She has had some mild reflux symptoms which she has been  controlling with Tums.  She also has mild chronic dyspnea.  Her lower  extremity edema has been much improved after stopping her Actos.   PHYSICAL EXAMINATION:  GENERAL:  She is well-appearing, no acute distress,  ambulates around the clinic without difficulty.  VITAL SIGNS:  Respirations are normal.  Blood pressure is 142/78, heart rate  88, weight is 206.  HEENT:   Sclerae anicteric.  EOMI.  There is no xanthelasma.  Mucous  membranes are moist.  NECK:  Supple.  There is no JVD.  Carotids are 2+ bilaterally without any  bruits.  There is no lymphadenopathy or thyromegaly.  CARDIAC:  Regular rate and rhythm.  No murmurs, rubs, or gallops.  LUNGS:  Clear.  ABDOMEN:  Obese, nontender, nondistended.  No hepatosplenomegaly.  No  bruits, no masses.  EXTREMITIES:  Warm with no cyanosis, clubbing, or edema.  NEUROLOGIC:  She is alert and oriented x3 with a bright affect.  Cranial  nerves 2-12 are intact.  There are no rashes.   ASSESSMENT/PLAN:  1. Coronary artery disease.  This appears quite stable.  She is having      some chest pain which I think is likely related to reflux disease.      However, given her known left anterior descending stent, I think it is  reasonable to proceed with adenosine Myoview to further evaluate.  We      will also start her on Prilosec.  2. Hypertension.  Blood pressure is still elevated.  We will switch her      from Toprol over to Coreg 6.25 b.i.d.  3. Hyperlipidemia.  She is followed by Dr. Everardo All.  We will continue      statin and need to her LDL under 70.  4. Lower extremity edema.  This is much improved.  Continue to follow.   DISPOSITION:  Return to clinic in 6 months for routine follow up.       Bevelyn Buckles. Bensimhon, MD     DRB/MedQ  DD:  02/12/2006  DT:  02/13/2006  Job #:  [id number]

## 2010-09-14 NOTE — Assessment & Plan Note (Signed)
[name]                         GASTROENTEROLOGY OFFICE NOTE   [name], [name]                   MRN:          [id number]  DATE:06/03/2006                            DOB:          [birth year]    This is a return office visit for iron-deficiency anemia.  She has  completed an eradication regimen for Helicobacter pylori.  Her aspirin  was decreased from 325 mg per day to 81 mg per day by her cardiologist.  She remains on Prilosec.  Erosive esophagitis and a small benign healing  prepyloric ulcer was noted at her endoscopy in mid December.  She has no  gastrointestinal complaints.   Medications listed on the chart updated and reviewed.   MEDICATION ALLERGIES:  SULFA DRUGS.   EXAMINATION:  GENERAL:  No acute distress.  VITAL SIGNS:  Weight 200, blood pressure 100/52, pulse 80 and regular.  CHEST:  Clear to auscultation bilaterally.  CARDIAC:  Regular rate and rhythm without murmurs.  ABDOMEN:  Soft, nontender, with normal active bowel sounds.  No palpable  organomegaly, masses or hernias.   ASSESSMENT AND PLAN:  1. Iron deficiency anemia, likely secondary to her prepyloric ulcer.      Continue Prilosec 20 mg p.o. q.a.m. indefinitely while she remains      on any aspirin products.  Continue Repliva for 6 more weeks.      Follow up with Dr. Everardo Hammond.  2. Gastroesophageal reflux disease with erosive esophagitis.  Maintain      antireflux measures and Prilosec 20 mg p.o. q.a.m.  3. Diverticulosis.  Long-term high-fiber diet.     [name]. Michaela Dar, MD, [facility]  Electronically Signed    MTS/MedQ  DD: 06/03/2006  DT: 06/03/2006  Job #: [id number]   cc:   Michaela Signs A. Everardo All, MD

## 2010-09-14 NOTE — Assessment & Plan Note (Signed)
[facility]                         GASTROENTEROLOGY OFFICE NOTE   [name], [name]                   MRN:          [id number]  DATE:04/10/2006                            DOB:          06/28/1933    REFERRING PHYSICIAN:  Sean A. Everardo All, MD   REASON FOR REFERRAL:  Iron deficiency anemia.   HISTORY OF PRESENT ILLNESS:  [name] is a 77 year old white female  that I have seen in the past. She underwent a routine colonoscopy for  colorectal cancer screening in December 2003 which showed sigmoid colon  diverticulosis with sigmoid colon spasm. She was noted to be anemic  several months ago, and the anemia has persisted. Most recent CBC from  March 24, 2006 revealed hemoglobin 9.6 with MCV 88.4. Iron saturation  was low at 16.6%. B12 was normal. She does have occasional heartburn and  indigestion that bother her on the average of once a month. She has a  history of coronary artery disease and is status post a Taxus drug-  eluting stent placement to the LAD in December 2006. Repeat  catheterization in January 2007 showed a patent stent. She has no other  gastrointestinal complaints except for mild heartburn. She specifically  denies abdominal pain, change in bowel habits, melena, hematochezia,  change in stool caliber, dysphagia, odynophagia, nausea, vomiting or  weight loss. No family history of colon cancer, colon polyps or  inflammatory bowel disease.   PAST MEDICAL HISTORY:  Hypertension, diabetes mellitus, hyperlipidemia,  coronary artery disease status post Taxus stent placement to the LAD in  2006, status post cholecystectomy or cholelithiasis in 1977, status post  hysterectomy in 1969, status post appendectomy in 1977.   CURRENT MEDICATIONS:  Listed on the chart-updated and reviewed.   MEDICATION ALLERGIES:  SULFA DRUGS.   Social History, Review of Systems from the handwritten form.   PHYSICAL EXAMINATION:  GENERAL:  Overweight  white female in no acute  distress, height 5 feet 7 inches, weight 204 pounds, blood pressure  122/78, pulse 60 and regular.  HEENT:  Anicteric sclerae, oropharynx clear.  CHEST:  Clear to auscultation bilaterally.  CARDIAC:  Regular rate and rhythm without murmurs appreciated.  ABDOMEN:  Soft, nontender, nondistended. Normoactive bowel sounds. No  palpable organomegaly, masses or hernias.  RECTAL:  Deferred.  EXTREMITIES:  Without clubbing, cyanosis or edema.  NEUROLOGIC:  Alert and oriented x3, grossly nonfocal.   ASSESSMENT/PLAN:  Iron deficiency anemia, rare episodes of  gastroesophageal reflux disease. Colonoscopy four years ago showed only  diverticulosis. Will obtain stool Hemoccults and a tissue  transglutaminase today. Proceed with upper endoscopy to rule out occult  upper gastrointestinal blood loss. Risks, benefits, and alternatives to  upper endoscopy with possible biopsy performed while on Plavix and  aspirin discussed with the patient, and she consents to proceed. This  will be scheduled electively. Begin Repliva 21/7 one p.o. q.a.m., and  discontinue her current iron preparation. Pending findings at upper  endoscopy, stool Hemoccults, and response to iron replacement, a repeat  colonoscopy may need to be performed. Return office visit in 6-8 weeks.     [name]. Russella Dar,  MD, [facility]  Electronically Signed    MTS/MedQ  DD: 04/10/2006  DT: 04/10/2006  Job #: [id number]   cc:   Gregary Signs A. Everardo All, MD

## 2010-09-14 NOTE — Assessment & Plan Note (Signed)
[facility]                            CARDIOLOGY OFFICE NOTE   OMNI, DUNSWORTH                   MRN:          [id number]  DATE:06/18/2006                            DOB:          Apr 29, 1934    PATIENT IDENTIFICATION:  [name] is a delightful 76 year old woman  who is here for routine followup.   PROBLEM LIST:  1. Coronary artery disease status Taxus drug-eluding stent to the LAD      December 2006.      a.     Re-catheterization January 2007 for recurrent chest pain.       Showed patent LAD stent, normal LV function.  2. Type 2 diabetes with neuropathy.  3. Lower extremity edema with normal BMP, resolved off Actos.  4. Hypertension.  5. Hyperlipidemia.  6. Iron-deficiency anemia secondary to peptic ulcer disease.  7. Gout.  8. Gallstones, status post cholecystectomy.   CURRENT MEDICATIONS:  1. Glucophage XR 500 mg b.i.d.  2. Glyburide 1.25 mg a day.  3. Coreg 6.25 mg b.i.d.  4. Plavix 75 mg a day.  5. Lisinopril 10 mg a day.  6. Lovastatin 40 mg b.i.d.  7. Aspirin 81 mg.  8. Multivitamin.  9. Iron.  10.Hydrochlorothiazide 25 mg.  11.Prilosec.   HISTORY:  [name] returns today for routine followup.  She has  recently undergone workup for iron-deficiency anemia and was found to  have peptic ulcer disease and also H. pylori infection which she has  been treated for.  From a cardiac point of view she is doing great.  She  denies any chest pain or shortness of breath.  Her lower extremity edema  has resolved after stopping Actos.   ON PHYSICAL EXAM:  GENERAL:  She is well-appearing.  No acute distress.  Ambulates around the clinic without difficulty.  Respirations are  unlabored.  VITAL SIGNS:  Blood pressure 140/76 with a heart rate of 80.  Weight 202  pounds which is stable.  HEENT:  Sclerae anicteric.  EOMI.  There is no xanthelasma.  Mucous  membranes are moist.  NECK:  Supple.  No JVD.  Carotids 2+ bilaterally.   No bruits.  There is  no lymphadenopathy or thyromegaly.  CARDIAC:  Regular rate and rhythm.  No murmurs, rubs, or gallops.  LUNGS:  Clear.  ABDOMEN:  Obese, nontender, nondistended.  No hepatosplenomegaly.  No  bruits.  No masses.  EXTREMITIES:  Warm.  No cyanosis, clubbing, or edema.  There is no rash.  NEUROLOGIC:  Alert and oriented x3.  Cranial nerves II through XII  intact.  Moves all four extremities.  She has a bright affect.   ASSESSMENT AND PLAN:  1. Coronary artery disease.  This is stable.  Continue current      regimen.  2. Hypertension.  Blood pressure is still mildly elevated.  We will      increase her Lisinopril to 20 mg a day and check a BMET in one      week.  3. Hyperlipidemia.  We are checking her lipids next week along with      her  BMET.  Given her coronary artery disease, we need to get her      LDL under 70.   DISPOSITION:  We will see her back in six months for routine followup.     Bevelyn Buckles. Bensimhon, MD  Electronically Signed    DRB/MedQ  DD: 06/18/2006  DT: 06/18/2006  Job #: [id number]   cc:   Gregary Signs A. Everardo All, MD

## 2010-09-14 NOTE — Cardiovascular Report (Signed)
[name], [name] NO.:  [id number]   MEDICAL RECORD NO.:  [id number]          PATIENT TYPE:  INP   LOCATION:  6524                         FACILITY:  [facility]   PHYSICIAN:  Arvilla Meres, M.D. LHCDATE OF BIRTH:  [birth year]   DATE OF PROCEDURE:  05/13/2005  DATE OF DISCHARGE:                              CARDIAC CATHETERIZATION   PRIMARY CARE PHYSICIAN:  Dr. Romero Belling.   CARDIOLOGIST:  Dr. Gala Romney.   PATIENT IDENTIFICATION:  [name] is a 77 year old woman with history of  coronary disease status post LAD stent in December 2006. Immediately after  stenting, she did have some mild chest pain but this resolved. I saw her  last week in the office and she was doing well. However, over the past few  days, she has had some episodes of mild chest pain which were reminiscent of  her previous angina. She was admitted to the ER. She was ruled out for  myocardial infarction with serial cardiac enzymes. EKG did not show any  acute changes. She was brought to the catheterization lab to evaluate the  patency of her recently placed stent.   PROCEDURES PERFORMED:  1.  Selective coronary angiography.  2.  Left heart cath.  3.  Left ventriculogram.   DESCRIPTION OF PROCEDURE:  The risks and benefits of procedure were  explained to [name]; consent was signed and placed in the chart. A 6-  [place] arterial sheath was placed in the right femoral artery using a  modified Seldinger technique. Standard catheters including preformed Judkins  JL-4, JR-4 and angled pigtail were used for the procedure. All catheter  exchanges were made over wire. There were no apparent complications.   FINDINGS:  Central aortic pressure was 130/62 with a mean of 90. LV pressure  was 150/4 with a LVEDP of 11. There was no aortic stenosis.   Left main was normal.   LAD was a moderate-sized vessel. It gave off a moderate-sized first  diagonal, tiny second diagonal and a small third  diagonal. There was  evidence of a previously placed stent in the midsection of the LAD, which  overlapped the second diagonal. This was widely patent. There was a 30%  stenosis in the LAD just after the stent. In the first diagonal, there was a  70-80% tubular stenosis which was unchanged from previous. In the tiny  second diagonal, there was a 99% ostial stenosis.   Left circumflex was a moderate-sized vessel made up primarily of a large  branching OM-1. There was a 40% lesion in the circumflex leading into the  ostium of the OM-1.   Right coronary artery was a moderate-sized dominant vessel gave off a  moderate-sized RV branch, a large PDA, small PL and a large second PL. There  was a 30% proximal stenosis in the RCA and a 30-40% midsection of the  takeoff of the RV branch. There was a 40% distally prior to the takeoff of  the PDA and a 30% very distal between the two PL's. In the RV branch, there  was a 95% proximal lesion. In the PDA, there was a  60% very distal lesion.   LV gram done in the RAO projection showed apparently a normal EF; however,  this was hard to tell given the significant amount of ectopy. There was no  mitral regurgitation.   ASSESSMENT:  1.  The left anterior descending artery stent was widely patent with      residual coronary artery disease and a moderate-sized first diagonal and      tiny second diagonal. There has been no change in this since December of      2006.  2.  Nonobstructive coronary disease in the right coronary artery and left      circumflex with high-grade lesion in a small right ventricular branch.   PLAN:  1.  We will proceed with medical therapy. Add Imdur 30 milligrams a day.  2.  She will be referred for cardiac rehab.  3.  She will be likely to be stable for discharge home in the a.m. if her      groin remains stable.   Of note, I did review the films with Dr. Charlies Constable who agreed with the  plan for medical therapy.       Arvilla Meres, M.D. [facility]  Electronically Signed     DB/MEDQ  D:  05/13/2005  T:  05/13/2005  Job:  [id number]   cc:   Gregary Signs A. Everardo All, M.D. [facility]  [address]  [place]  [place] [address]

## 2010-09-14 NOTE — Consult Note (Signed)
NAMESHAMARIAH, SHEWMAKE NO.:  [id number]   MEDICAL RECORD NO.:  [id number]          PATIENT TYPE:  INP   LOCATION:  2852                         FACILITY:  [facility]   PHYSICIAN:  Arvilla Meres, M.D. LHCDATE OF BIRTH:  16-Aug-1933   DATE OF CONSULTATION:  DATE OF DISCHARGE:  04/01/2005                                   CONSULTATION   PRIMARY CARE PHYSICIAN:  Dr. Romero Belling.   CARDIOLOGY:  She is new to [facility].   REASON FOR CONSULT:  Chest heaviness.   HISTORY OF PRESENT ILLNESS:  Ms. Spieth is a delightful 77 year old woman  with multiple cardiac risk factors, including diabetes mellitus,  hypertension, hyperlipidemia, and a strong family history, but no known  personal history of coronary artery disease, who is admitted for two weeks  of chest heaviness.  She denies any previous history of known coronary  disease.  She has never had a stress test, or a cardiac catheterization.  She states that over the past two weeks, she has had multiple week episodes  of chest heaviness.  This occurs both with exertion and at rest.  The pain  can last a few minutes, or up to all day.  There are no associated symptoms.  She denies any radiation to her arm or her neck.  She denies any  diaphoresis.  There has been no significant nausea or vomiting.  She has not  had any heart failure or palpations.  When asked specifically about the  relation of the chest heaviness to exertion.  She says that it is hard for  her tell, as her functional capacity is very limited secondary to her  peripheral neuropathy.  This morning, she went to see Dr. Everardo All for a  routine visit, and at the end of her visit, did mention about the chest  heaviness, and she thus referred to the emergency room for evaluation and  further workup of her chest discomfort.   REVIEW OF SYSTEMS:  As per HPI and problem list.  Otherwise, all systems  negative.   PAST MEDICAL HISTORY:  1.  Diabetes  mellitus.      1.  On oral agents x10 years.  Hemoglobin A1C at 6.1 today.      2.  History of severe peripheral neuropathy.  2.  Hypertension.  3.  Hyperlipidemia.  4.  Obesity.  5.  Gallstones status post cholecystectomy.  6.  Status post hysterectomy.   CURRENT MEDICATIONS:  1.  Actos 45 mg a day.  2.  Mevacor 80 mg a day.  3.  Aspirin 81 mg a day.  4.  Lasix 20 mg a day.  5.  Prinvil 10 mg a day.  6.  Glucovance unknown dose.   MEDICATION ALLERGIES:  SULFA.   SOCIAL HISTORY:  She is retired.  She lives with her husband here in  [place].  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  She denies any history of tobacco or alcohol use.   FAMILY HISTORY:  Her father died at 51 from myocardial infarction.  Mother  died at 48 from myocardial  infarction.  Sister died at 81 from a sudden  myocardial infarction.  Brother died from motor vehicle accident age 69.   PHYSICAL EXAMINATION:  VITAL SIGNS:  Blood pressure is 147/69.  Her heart  rate is 80.  Her respirations are unlabored.  She is satting in the high 90s  on room air.  GENERAL:  She is lying flat in bed, in no acute distress.  HEENT:  Sclerae anicteric.  EOMI.  There are no xanthelasmas.  Mucous  membranes are moist.  NECK:  Supple.  There is no JVD.  Carotids are 2+ bilaterally without any  bruits.  There is no lymphadenopathy or thyromegaly.  CARDIAC:  Regular rate and rhythm with no murmurs, rubs or gallops.  LUNGS:  Clear to auscultation.  ABDOMEN:  Mildly obese, soft, nontender, nondistended.  There is no  hepatosplenomegaly, no bruits, no masses.  The abdominal aortic impulse does  not appear widened.  There are good bowel sounds.  EXTREMITIES:  Warm with no cyanosis or clubbing.  There is trace lower  extremity edema.  Femoral pulses are 2+ bilaterally without any bruits.  Distal pulses are non-palpable.  NEUROLOGIC:  She is alert and oriented x3.  She is a very pleasant affect.  Cranial nerves  2 through 12 are intact.  She moves all four extremities  without any difficulty.   LABORATORY DATA:  Pending.  EKG shows normal sinus rhythm at a rate of 73.  There is poor R wave progression, but no significant ST-T wave changes.   ASSESSMENT/PLAN:  Chest pain.  This has both typical and atypical features.  However, she does have multiple cardiac risk factors and extensive family  history for coronary artery disease.  I discussed the risk and benefits of  cardiac catheterization versus a functional study with both she and her  husband.  At this point, we have agreed to proceed with cardiac  catheterization.  Hopefully, this can be done today.  Obviously, she will  need continued risk factor modification.      Arvilla Meres, M.D. [facility]  Electronically Signed     DB/MEDQ  D:  04/01/2005  T:  04/01/2005  Job:  [id number]

## 2010-09-14 NOTE — Discharge Summary (Signed)
[name], [name]            ACCOUNT NO.:  [id number]   MEDICAL RECORD NO.:  [id number]          PATIENT TYPE:  INP   LOCATION:  6523                         FACILITY:  [facility]   PHYSICIAN:  Rene Paci, M.D. LHCDATE OF BIRTH:  07-Dec-1933   DATE OF ADMISSION:  04/01/2005  DATE OF DISCHARGE:  04/02/2005                                 DISCHARGE SUMMARY   DISCHARGE DIAGNOSES:  1.  Unstable angina, status post catheterization with percutaneous coronary      intervention and stent to the left anterior descending. Continue medical      management.  2.  Type 2 diabetes, reasonably well controlled. A1c of 6.1. Continue      medical management as recommended by primary MD.  3.  Hypertension.  4.  Dyslipidemia.  5.  History of osteoporosis.  6.  Obesity.   DISCHARGE MEDICATIONS:  1.  Aspirin 81 mg daily.  2.  Plavix 75 mg daily.  3.  Zestril 10 mg daily.  4.  Lovastatin 80 mg daily.  5.  Actos 45 mg daily.  6.  Lasix 20 mg daily.  7.  Multivitamin once daily.  8.  Nitro-Quick p.r.n. chest pain.  9.  Glyburide 1.25 mg p.o. q.a.m.  10. Glucophage XR 1 gm b.i.d., to be resumed on April 03, 2005.  11. Toprol XL 25 mg daily.   DISPOSITION:  The patient is discharged home where she lives with her  husband in medically stable condition. She is chest pain free and will be  arranged for cardiac rehab for three months prior to discharge.   HOSPITAL FOLLOWUP:  With cardiology, Dr. Gala Romney, of [facility],  scheduled on December 18th at 1:15 p.m. The patient will also contact  primary care physician, Dr. Gregary Signs A. Everardo All, for an appointment in the next  two to three weeks for follow-up post hospitalization.   CONDITION ON DISCHARGE:  Medically stable and improved.   HOSPITAL COURSE:  Problem #1:  Unstable angina. The patient is a 77 year old  woman with no known previous history of coronary artery disease with  multiple risk factors who was at her primary MDs office on  the day of  admission when she complained of substernal chest heaviness, intermittent  and ongoing for the last few weeks. Because of numerous risk factors  including dyslipidemia, hypertension, diabetes, and age, she was sent to the  emergency room for further evaluation and cardiac consultation. Her EKG  showed nonspecific changes. Because of the concerning nature of the history  she was taken to the cath lab where she was found to have LAD occlusion 90%.  She underwent PTCA and stenting to this area approximately. She also had  extensive diffuse coronary artery disease. They recommended aggressive  continued medical management for changes which have been made as listed  above. The following morning she had a brief episode of chest pain, but no  EKG changes. Cardiac enzymes check showed MB was less than or equal to two  times upper limit of normal at 6.1 and thus felt stable for discharge home.  Per cardiology, continue  medical changes and risk  factor management  including doubling of Statin and diabetic changes of Actos from 30 mg to 45  mg. Discontinuation of metformin, glyburide combined component to single  daily glyburide and increased dose of extended release metformin. Also  addition of Plavix and aspirin to her medical regimen.      Rene Paci, M.D. [facility]  Electronically Signed     VL/MEDQ  D:  04/02/2005  T:  04/02/2005  Job:  [id number]

## 2010-09-14 NOTE — H&P (Signed)
[name], [name]            ACCOUNT NO.:  [id number]   MEDICAL RECORD NO.:  [id number]          PATIENT TYPE:  INP   LOCATION:  6523                         FACILITY:  [facility]   PHYSICIAN:  Sean A. Everardo All, M.D. [facility] OF BIRTH:  [birth year]   DATE OF ADMISSION:  04/01/2005  DATE OF DISCHARGE:                                HISTORY & PHYSICAL   REASON FOR ADMISSION:  Chest pain.   HISTORY OF PRESENT ILLNESS:  A 77 year old woman with several weeks of  moderate intermittent heaviness quality chest pain at the substernal area.  It is not necessarily worse in the exertional context.   She states that her diabetes is well controlled.   PAST MEDICAL HISTORY:  1.  Hypertension.  2.  Osteoporosis.  3.  Dyslipidemia.   MEDICATIONS:  1.  Zestril 10 mg a day.  2.  Glucovance 5/500 one twice daily.  3.  Lovastatin 40 mg daily.  4.  Lasix 20 mg a day.  5.  Actos 30 mg a day.   SOCIAL HISTORY:  She is retired.  She is married.  Her husband is here with  her.   FAMILY HISTORY:  A number of family members had heart disease or sudden  death at a very young age.   REVIEW OF SYSTEMS:  She is having some hypoglycemia before lunch.  She  denies the following:  Fever, weight gain, weight loss, syncope, shortness  of breath, rectal bleeding, hematuria, abdominal pain, incontinence,  decreased urinary force.   PHYSICAL EXAMINATION:  VITAL SIGNS:  Blood pressure 129/72, heart rate is  86, temperature is 97.4, and weight is 192.  GENERAL:  No distress.  SKIN:  Not diaphoretic.  HEENT:  Head is atraumatic.  Pharynx with no erythema.  NECK:  Supple, no goiter.  CHEST:  Clear to auscultation.  CARDIOVASCULAR:  No JVD and trace bilateral pretibial edema.  Regular rate  and rhythm, no murmur.  Pedal pulses are intact and there is no bruit at the  carotid arteries.  ABDOMEN:  Soft, obese, nontender, and no hepatosplenomegaly.  No mass.  BREASTS:  PELVIC:  RECTAL:  Not done at this time due  to possibility of  unstable angina.  EXTREMITIES:  Normal color and temperature on the feet, and there is no  deformity.  There is no ulcer on the feet.   Electrocardiogram shows minimal nonspecific changes.   LABORATORY DATA:  Hemoglobin A1C 6.1%.   IMPRESSION:  1.  Unstable angina.  2.  Diabetes is over controlled for a patient on sulfonylurea therapy.   PLAN:  1.  Admit to telemetry.  2.  Heparin.  3.  Consult cardiology.  4.  She will go to the hospital by ambulance.  5.  For her diabetes I have given her a prescription for use when she leaves      the hospital.  I have advised to increase the Actos to 45 mg a day.  She      also will change her Glucovance to Glyburide 1.25 mg every morning and      Glucophage/XR 1000 mg twice daily.  6.  She will return in four months with a hemoglobin A1C prior.  7.  I discussed code status with the patient and she states that she wants      to defer any decision for now.           ______________________________  Cleophas Dunker. Everardo All, M.D. [facility]     SAE/MEDQ  D:  04/01/2005  T:  04/01/2005  Job:  [id number]

## 2010-09-14 NOTE — Discharge Summary (Signed)
[name], [name]            ACCOUNT NO.:  [id number]   MEDICAL RECORD NO.:  [id number]          PATIENT TYPE:  INP   LOCATION:  6524                         FACILITY:  [facility]   PHYSICIAN:  Learta Codding, M.D. LHCDATE OF BIRTH:  Sep 18, 1933   DATE OF ADMISSION:  05/13/2005  DATE OF DISCHARGE:  05/14/2005                                 DISCHARGE SUMMARY   PRIMARY CARDIOLOGIST:  Dr. Arvilla Meres.   PRIMARY CARE PHYSICIAN:  Dr. Romero Belling.   PRINCIPAL DIAGNOSIS:  Chest pain.   OTHER DIAGNOSES:  1.  Coronary artery disease.  2.  Hypertension.  3.  Hyperlipidemia.  4.  Type 2 diabetes mellitus.  5.  Obesity.  6.  Osteoporosis.   ALLERGIES:  SULFA.   PROCEDURES:  Left heart cardiac catheterization.   HISTORY OF PRESENT ILLNESS:  A 78 year old female with a prior history of  CAD status post PCI and stenting of the LAD with Taxus drug eluting stent in  December of 2006.  Since her procedure, she has had intermittent chest pain  that was getting worse over the past two days, similar previous angina, and  relieved with nitroglycerin.  For this reason, she presented to the [facility] ED on May 13, 2005 and was admitted.   HOSPITAL COURSE:  The patient ruled out for MI, and on May 13, 2005  underwent left heart cardiac catheterization, which revealed a patent stent  in the mid LAD with a __________  of the second diagonal with a 99%  osteolesion.  The first diagonal had a 70 to 80% proximal stenosis.  She  otherwise, had nonobstructive coronary disease and normal LV function.  Imdur was added to her medical regimen, and titrated to 60 mg daily.  She  has not had any recurrent chest pains when ambulating this morning, and is  being discharged home today in satisfactory condition.   DISCHARGE LABORATORY:  Hemoglobin 10.1, hematocrit 29.1, WBC 4.9, platelets  284, MCV 87.9, sodium 142, potassium 4.1, chloride 109, CO2 25, BUN 22,  creatinine 1.3, glucose 103, PT  12.2, INR 0.9, PTT 33.  Total bilirubin 0.6,  alkaline phosphatase 51, AST 24, ALT 19, albumin 3.2, cardiac markers are  negative x3, calcium 9, magnesium 2, BNP was less than 30, TSH 1.572.   DISPOSITION:  The patient is being discharged home today in good condition.   FOLLOW UP PLANS AND APPOINTMENTS:  She has a follow-up appointment with Dr.  Arvilla Meres on June 06, 2005 at 2:30 p.m.   DISCHARGE MEDICATIONS:  1.  Aspirin 325 mg daily.  2.  Plavix 75 mg daily.  3.  Imdur 60 mg daily.  4.  Toprol XL 25 mg daily.  5.  Prinivil 10 mg daily.  6.  Lovastatin 40 mg two tabs daily.  7.  HCTZ 25 mg daily.  8.  Glucophage XR 500 mg b.i.d. to be resumed May 15, 2005.  9.  Glyburide 1.25 mg daily.  10. Nitroglycerin 0.4 mg sublingual p.r.n. chest pain.  11. Actos 45 mg daily.  12. Potassium as previously prescribed.   OUTSTANDING LABORATORY STUDIES:  None.   DURATION OF DISCHARGE ENCOUNTER:  Thirty-five minutes including physician  time.      Ok Anis, NP      Learta Codding, M.D. [facility]  Electronically Signed    CRB/MEDQ  D:  05/14/2005  T:  05/14/2005  Job:  [id number]   cc:   Arvilla Meres, M.D. [facility]  [facility]  [address]  [place]  [place] [address]   Gregary Signs A. Everardo All, M.D. [facility]  [address]  [place]  [place] [address]

## 2010-09-19 ENCOUNTER — Telehealth: Payer: Self-pay | Admitting: Internal Medicine

## 2010-09-19 MED ORDER — CARVEDILOL 6.25 MG PO TABS: 6.2500 mg | ORAL_TABLET | Freq: Two times a day (BID) | ORAL | Status: DC

## 2010-09-19 NOTE — Telephone Encounter (Signed)
Refill- carvedilol 6.25mg  tab. Take one tablet by mouth twice daily. Qty 180. Last fill 2.21.12

## 2010-09-19 NOTE — Telephone Encounter (Signed)
Rx refill sent to pharmacy. 

## 2010-09-25 ENCOUNTER — Ambulatory Visit: Payer: Medicare Other | Admitting: Internal Medicine

## 2010-09-26 ENCOUNTER — Other Ambulatory Visit: Payer: Self-pay | Admitting: Family Medicine

## 2010-09-26 ENCOUNTER — Ambulatory Visit: Payer: Medicare Other | Admitting: Internal Medicine

## 2010-09-26 ENCOUNTER — Other Ambulatory Visit: Payer: Self-pay | Admitting: Internal Medicine

## 2010-09-26 ENCOUNTER — Telehealth: Payer: Self-pay | Admitting: *Deleted

## 2010-09-26 NOTE — Telephone Encounter (Signed)
Received call from [name] that pt reported to the lab for blood work prior to her appt on 10/02/10 with Dr Artist Pais. Please advise what labs and dx codes pt should have.

## 2010-09-27 NOTE — Telephone Encounter (Signed)
Rx refill x 5 to [facility]

## 2010-09-27 NOTE — Telephone Encounter (Signed)
Order entered and faxed to the lab for BMP and HGB a1c 250.02 per office note of 06/27/10.

## 2010-09-27 NOTE — Telephone Encounter (Signed)
Katrina called stating she will have to discard of the lavender tube by the end of today. Please advise what labs should be ordered.

## 2010-09-28 LAB — BASIC METABOLIC PANEL WITHOUT GFR
BUN: 32 mg/dL — ABNORMAL HIGH (ref 6–23)
CO2: 27 meq/L (ref 19–32)
Calcium: 9.7 mg/dL (ref 8.4–10.5)
Chloride: 107 meq/L (ref 96–112)
Creat: 1.71 mg/dL — ABNORMAL HIGH (ref 0.40–1.20)
GFR, Est African American: 35 mL/min — ABNORMAL LOW
GFR, Est Non African American: 29 mL/min — ABNORMAL LOW
Glucose, Bld: 201 mg/dL — ABNORMAL HIGH (ref 70–99)
Potassium: 4.3 meq/L (ref 3.5–5.3)
Sodium: 142 meq/L (ref 135–145)

## 2010-09-28 LAB — HEMOGLOBIN A1C: Hgb A1c MFr Bld: 8.8 % — ABNORMAL HIGH (ref <5.7)

## 2010-10-01 ENCOUNTER — Ambulatory Visit: Payer: Medicare Other | Admitting: Internal Medicine

## 2010-10-17 ENCOUNTER — Ambulatory Visit: Payer: Medicare Other | Admitting: Internal Medicine

## 2010-10-17 ENCOUNTER — Encounter: Payer: Self-pay | Admitting: Family

## 2010-10-17 ENCOUNTER — Ambulatory Visit (INDEPENDENT_AMBULATORY_CARE_PROVIDER_SITE_OTHER): Payer: Medicare Other | Admitting: Family

## 2010-10-17 DIAGNOSIS — I10 Essential (primary) hypertension: Secondary | ICD-10-CM

## 2010-10-17 DIAGNOSIS — E119 Type 2 diabetes mellitus without complications: Secondary | ICD-10-CM

## 2010-10-17 MED ORDER — LISINOPRIL 2.5 MG PO TABS: 2.5000 mg | ORAL_TABLET | Freq: Every day | ORAL | Status: DC

## 2010-10-17 NOTE — Progress Notes (Signed)
Subjective:    Patient ID: [name], female    DOB: [birth year], 77 y.o.   MRN: [id number]  HPI  DM2-  Reports that her sugar is about 150 fasting. Feels that her sugars have gone up since she has been off of the glyburide.  She denies any symptomatic hypoglycemia.  Had eye exam 3 months ago.  Has had pneumovax.  Denies polyuria or polydipsia.  HTN-  Reports that her blood pressure can drop to 80-90/55 and she has associated weakness and sensation that she is going to "pass out." She reports that this happens after she takes her BP meds.     Review of Systems See HPI  Past Medical History  Diagnosis Date  . Hypertension   . Hyperlipidemia   . GERD (gastroesophageal reflux disease)   . Anemia   . CAD (coronary artery disease)     status Taxus drug-eluding stent to the LAD December 2006  . Edema     lower extremity with normal BNP  . Encounter for long-term (current) use of medications   . Hyperkalemia   . Glossitis   . Leg pain, left   . Routine general medical examination at a health care facility   . Chronic renal disease     mild  . Pyuria   . Anemia, iron deficiency   . UTI (urinary tract infection)   . Pain in foot     bilateral  . Osteopenia   . Diverticulosis of colon   . Diabetes mellitus type II   . Polyneuropathy     History   Social History  . Marital Status: Married    Spouse Name: N/A    Number of Children: N/A  . Years of Education: N/A   Occupational History  . Not on file.   Social History Main Topics  . Smoking status: Never Smoker   . Smokeless tobacco: Not on file  . Alcohol Use: No  . Drug Use: No  . Sexually Active: Not on file   Other Topics Concern  . 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 Cholecystectomy   . Total abdominal hysterectomy 1969    no BSO  . Ptca     stent to LAD  in 03/2005, recatheterization inJanuarry 2007 for recurrent chest pain  that showed a patent LAD stent with normal LV function  . Esophagogastroduodenoscopy 04/15/2006  . Cardiovascular stress test 02/24/2006  . Electrocardiogram 06/18/2006    Family History  Problem Relation Age of Onset  . Heart disease Mother   . Heart disease Sister     Allergies  Allergen Reactions  . Pioglitazone     REACTION: Edema  . Sulfonamide Derivatives     Current Outpatient Prescriptions on File Prior to Visit  Medication Sig Dispense Refill  . aspirin 81 MG tablet Take 81 mg by mouth. 2 tablets by mouth once daily       . carvedilol (COREG) 6.25 MG tablet Take 1 tablet (6.25 mg total) by mouth 2 (two) times daily with a meal.  180 tablet  0  . fenofibrate 54 MG tablet Take 54 mg by mouth daily.        . insulin aspart (NOVOLOG FLEXPEN) 100 UNIT/ML injection Inject 10 Units into the skin 3 (three) times daily before meals.       Marland Kitchen  Insulin Pen Needle 31G X 8 MM MISC 1 each by Does not apply route 2 (two) times daily before a meal.  100 each  1  . lovastatin (MEVACOR) 40 MG tablet TAKE ONE TABLET BY MOUTH AT BEDTIME  30 tablet  5  . Multiple Vitamin (MULTIVITAMIN) tablet Take 1 tablet by mouth daily.        . nitroGLYCERIN (NITROSTAT) 0.4 MG SL tablet Place 0.4 mg under the tongue every 5 (five) minutes as needed.        Marland Kitchen omeprazole (PRILOSEC) 20 MG capsule Take 20 mg by mouth daily.        Marland Kitchen DISCONTD: insulin glargine (LANTUS SOLOSTAR) 100 UNIT/ML injection Inject 10-15 Units into the skin daily.  13.5 mL  1  . DISCONTD: lisinopril-hydrochlorothiazide (PRINZIDE,ZESTORETIC) 10-12.5 MG per tablet Take by mouth. 1/2 tablet by mouth daily      . triamcinolone (KENALOG) 0.1 % cream Apply topically 2 (two) times daily. For one week       . DISCONTD: glyBURIDE (DIABETA) 5 MG tablet Take 1 tablet (5 mg total) by mouth daily with breakfast.  30 tablet  0    BP 130/79  Pulse 63  Temp(Src) 97.7 F (36.5 C)  (Oral)  Resp 18  Ht 5\' 7"  (1.702 m)  Wt 180 lb (81.647 kg)  BMI 28.19 kg/m2       Objective:   Physical Exam  Constitutional: She appears well-developed and well-nourished.  Cardiovascular: Normal rate and regular rhythm.   Pulmonary/Chest: Effort normal and breath sounds normal.  Musculoskeletal:       See diabetic foot exam          Assessment & Plan:

## 2010-10-17 NOTE — Assessment & Plan Note (Signed)
Pt seems to be having some symptomatic hypotension per her home records.  Will change lisinopril/HCTZ to a lisinopril 2.5 only. Pt to follow up in 1 month.

## 2010-10-17 NOTE — Assessment & Plan Note (Signed)
a1c is 8.8, not at goal.  Increase lantus from 12 to 17 units. Pt to follow up in 1 month.

## 2010-10-17 NOTE — Patient Instructions (Signed)
Please follow up with Dr. Artist Pais in 1 month.

## 2010-10-25 ENCOUNTER — Other Ambulatory Visit: Payer: Self-pay | Admitting: Internal Medicine

## 2010-10-25 NOTE — Telephone Encounter (Signed)
Rx refill sent to pharmacy. 

## 2010-10-30 ENCOUNTER — Other Ambulatory Visit: Payer: Self-pay | Admitting: *Deleted

## 2010-10-30 MED ORDER — INSULIN ASPART 100 UNIT/ML ~~LOC~~ SOLN: 10.0000 [IU] | Freq: Three times a day (TID) | SUBCUTANEOUS | Status: DC

## 2010-10-30 NOTE — Telephone Encounter (Signed)
Patient called requesting samples of Novolog. She was informed samples were not available. She requested a rx sent to her local pharmacy.

## 2010-11-21 ENCOUNTER — Encounter: Payer: Self-pay | Admitting: Internal Medicine

## 2010-11-21 ENCOUNTER — Ambulatory Visit: Payer: Medicare Other | Admitting: Internal Medicine

## 2010-11-21 ENCOUNTER — Ambulatory Visit (INDEPENDENT_AMBULATORY_CARE_PROVIDER_SITE_OTHER): Payer: Medicare Other | Admitting: Internal Medicine

## 2010-11-21 DIAGNOSIS — E119 Type 2 diabetes mellitus without complications: Secondary | ICD-10-CM

## 2010-11-21 DIAGNOSIS — I10 Essential (primary) hypertension: Secondary | ICD-10-CM

## 2010-11-21 MED ORDER — FENOFIBRATE 54 MG PO TABS: 54.0000 mg | ORAL_TABLET | Freq: Every day | ORAL | Status: DC

## 2010-11-21 MED ORDER — INSULIN ASPART 100 UNIT/ML ~~LOC~~ SOLN: SUBCUTANEOUS | Status: DC

## 2010-11-21 NOTE — Patient Instructions (Addendum)
Increase your lantus insulin dose 2 units every 3 days until your morning fasting blood sugars are consistently less than 130.  Please schedule cbc, chem7, a1c, urine microalbumin 250.0 and lipid/lft 272.4 prior to next visit

## 2010-11-21 NOTE — Progress Notes (Signed)
  Subjective:    Patient ID: [name], female    DOB: 16-Jan-1934, 76 y.o.   MRN: [id number]  HPI Patient presents to clinic for evaluation of diabetes and hypertension. Last visit was having presyncopal episodes with low blood pressure. Diuretic was stopped and low-dose ACE inhibitor continue. Presyncopal symptoms have resolved and denies dizziness, presyncope or syncope. Blood pressure is normotensive. Blood sugars recently averaging in the 130 range with the low 121 and peak of 160. No hypoglycemia. No other complaints  Reviewed past medical history, medications and allergies    Review of Systems see history of present illness     Objective:   Physical Exam    Physical Exam  Vitals reviewed. Constitutional:  appears well-developed and well-nourished. No distress.  HENT:  Head: Normocephalic and atraumatic.  Nose: Nose normal.  Mouth/Throat:  Eyes: Conjunctivae and EOM are normal. Pupils are equal, round, and reactive to light. Right eye exhibits no discharge. Left eye exhibits no discharge. No scleral icterus.  Neck: Neck supple. Cardiovascular: Normal rate, regular rhythm and normal heart sounds.  Exam reveals no gallop and no friction rub.   No murmur heard. Pulmonary/Chest: Effort normal and breath sounds normal. No respiratory distress.  has no wheezes.  has no rales.  Neurological:  is alert.  Skin: Skin is warm and dry.  not diaphoretic.  Psychiatric: normal mood and affect.      Assessment & Plan:

## 2010-11-21 NOTE — Assessment & Plan Note (Signed)
Normotensive. Previous symptoms resolved with stopping diuretic. Continue current regimen. Monitor blood pressure as an outpatient and follow up in clinic as scheduled

## 2010-11-21 NOTE — Assessment & Plan Note (Signed)
Minimally suboptimal control. Discussed increasing the Lantus 2 units every 3 days and tell fasting blood sugars consistently less than 130 without hypoglycemia.

## 2010-11-24 NOTE — Progress Notes (Signed)
  Subjective:    Patient ID: [name], female    DOB: Jul 04, 1933, 77 y.o.   MRN: [id number]  HPI    Review of Systems     Objective:   Physical Exam        Assessment & Plan:   No problem-specific assessment & plan notes found for this encounter.

## 2010-12-19 ENCOUNTER — Other Ambulatory Visit: Payer: Self-pay | Admitting: *Deleted

## 2010-12-19 NOTE — Telephone Encounter (Signed)
Patient called and left voice message requesting samples of Lantus and Novolog.

## 2010-12-19 NOTE — Telephone Encounter (Signed)
Call placed to patient at [phone]. She was informed that there are not any Lantus samples available, however there were Novolog samples. Patient states she was switched from Levimir to Lantus, and she still had some Levimir samples that she is currently using18-20 units at bedtime since she is out of the Lantus. She states she is using  the Novolog she is using three times a day. Patient states that she has never picked a Rx for Lantus; she has always been provided samples.  Please advise.

## 2010-12-20 NOTE — Telephone Encounter (Signed)
Ok for [employer]

## 2010-12-20 NOTE — Telephone Encounter (Signed)
Call placed to patient at [phone], she was informed per Dr Rodena Medin that it was okay for her to continue with the Levimer. She was informed there were no samples of Lantus available. Samples of Novolog  Lot # [id number]  EXP 06/2012, left at front desk for patient pick up.

## 2010-12-21 ENCOUNTER — Telehealth: Payer: Self-pay | Admitting: Internal Medicine

## 2010-12-21 MED ORDER — CARVEDILOL 6.25 MG PO TABS: 6.2500 mg | ORAL_TABLET | Freq: Two times a day (BID) | ORAL | Status: DC

## 2010-12-21 NOTE — Telephone Encounter (Signed)
Rx refill sent to pharmacy. 

## 2010-12-21 NOTE — Telephone Encounter (Signed)
Refill- carvedilol 6.25mg  tab. Take one tablet by mouth twice daily with meals. Qty 180. Last fill 5.24.12

## 2011-01-06 ENCOUNTER — Emergency Department (HOSPITAL_COMMUNITY): Payer: Medicare Other

## 2011-01-06 ENCOUNTER — Inpatient Hospital Stay (HOSPITAL_COMMUNITY)
Admission: EM | Admit: 2011-01-06 | Discharge: 2011-01-07 | DRG: 313 | Disposition: A | Discharge status: Home / Self Care | Payer: Medicare Other | Attending: Internal Medicine | Admitting: Internal Medicine

## 2011-01-06 DIAGNOSIS — I129 Hypertensive chronic kidney disease with stage 1 through stage 4 chronic kidney disease, or unspecified chronic kidney disease: Secondary | ICD-10-CM | POA: Diagnosis present

## 2011-01-06 DIAGNOSIS — N189 Chronic kidney disease, unspecified: Secondary | ICD-10-CM | POA: Diagnosis present

## 2011-01-06 DIAGNOSIS — D649 Anemia, unspecified: Secondary | ICD-10-CM | POA: Diagnosis present

## 2011-01-06 DIAGNOSIS — K219 Gastro-esophageal reflux disease without esophagitis: Secondary | ICD-10-CM | POA: Diagnosis present

## 2011-01-06 DIAGNOSIS — R110 Nausea: Secondary | ICD-10-CM

## 2011-01-06 DIAGNOSIS — E785 Hyperlipidemia, unspecified: Secondary | ICD-10-CM | POA: Diagnosis present

## 2011-01-06 DIAGNOSIS — Z7982 Long term (current) use of aspirin: Secondary | ICD-10-CM

## 2011-01-06 DIAGNOSIS — R0789 Other chest pain: Principal | ICD-10-CM | POA: Diagnosis present

## 2011-01-06 DIAGNOSIS — R079 Chest pain, unspecified: Secondary | ICD-10-CM

## 2011-01-06 DIAGNOSIS — E119 Type 2 diabetes mellitus without complications: Secondary | ICD-10-CM | POA: Diagnosis present

## 2011-01-06 DIAGNOSIS — I2510 Atherosclerotic heart disease of native coronary artery without angina pectoris: Secondary | ICD-10-CM | POA: Diagnosis present

## 2011-01-06 LAB — DIFFERENTIAL
Basophils Absolute: 0.1 10*3/uL (ref 0.0–0.1)
Basophils Relative: 1 % (ref 0–1)
Eosinophils Absolute: 0.4 10*3/uL (ref 0.0–0.7)
Eosinophils Relative: 5 % (ref 0–5)
Lymphocytes Relative: 39 % (ref 12–46)
Lymphs Abs: 3 10*3/uL (ref 0.7–4.0)
Monocytes Absolute: 0.9 10*3/uL (ref 0.1–1.0)
Monocytes Relative: 11 % (ref 3–12)
Neutro Abs: 3.5 10*3/uL (ref 1.7–7.7)
Neutrophils Relative %: 45 % (ref 43–77)

## 2011-01-06 LAB — CK TOTAL AND CKMB (NOT AT ARMC)
CK, MB: 3.9 ng/mL (ref 0.3–4.0)
Relative Index: INVALID (ref 0.0–2.5)
Total CK: 45 U/L (ref 7–177)

## 2011-01-06 LAB — BASIC METABOLIC PANEL
BUN: 37 mg/dL — ABNORMAL HIGH (ref 6–23)
CO2: 24 mEq/L (ref 19–32)
Calcium: 9.9 mg/dL (ref 8.4–10.5)
Chloride: 103 mEq/L (ref 96–112)
Creatinine, Ser: 1.78 mg/dL — ABNORMAL HIGH (ref 0.50–1.10)
GFR calc Af Amer: 34 mL/min — ABNORMAL LOW (ref >60)
GFR calc non Af Amer: 28 mL/min — ABNORMAL LOW (ref >60)
Glucose, Bld: 119 mg/dL — ABNORMAL HIGH (ref 70–99)
Potassium: 4 mEq/L (ref 3.5–5.1)
Sodium: 137 mEq/L (ref 135–145)

## 2011-01-06 LAB — POCT I-STAT TROPONIN I: Troponin i, poc: 0 ng/mL (ref 0.00–0.08)

## 2011-01-06 LAB — CBC
Hemoglobin: 12.3 g/dL (ref 12.0–15.0)
RBC: 3.96 MIL/uL (ref 3.87–5.11)
WBC: 7.9 10*3/uL (ref 4.0–10.5)

## 2011-01-06 LAB — GLUCOSE, CAPILLARY: Glucose-Capillary: 122 mg/dL — ABNORMAL HIGH (ref 70–99)

## 2011-01-07 DIAGNOSIS — I2510 Atherosclerotic heart disease of native coronary artery without angina pectoris: Secondary | ICD-10-CM

## 2011-01-07 LAB — GLUCOSE, CAPILLARY: Glucose-Capillary: 125 mg/dL — ABNORMAL HIGH (ref 70–99)

## 2011-01-07 LAB — CARDIAC PANEL(CRET KIN+CKTOT+MB+TROPI)
CK, MB: 3.4 ng/mL (ref 0.3–4.0)
Relative Index: INVALID (ref 0.0–2.5)
Relative Index: INVALID (ref 0.0–2.5)
Total CK: 39 U/L (ref 7–177)
Troponin I: <0.3 ng/mL (ref <0.30)

## 2011-01-07 NOTE — H&P (Signed)
[name], Michaela NO.:  [id number]  MEDICAL RECORD NO.:  [id number]  LOCATION:  [facility]                         FACILITY:  [facility]  PHYSICIAN:  Zacarias Pontes, MD       DATE OF BIRTH:  [birth year]  DATE OF ADMISSION:  01/06/2011 DATE OF DISCHARGE:                             HISTORY & PHYSICAL   PRIMARY CARDIOLOGIST:  Bevelyn Buckles. Bensimhon, MD  PATIENT LOCATION:  Evaluated in the emergency room.  CHIEF COMPLAINT:  Chest pain and nausea.  HISTORY OF PRESENT ILLNESS:   [name] is a pleasant 77 year old woman with a history of coronary artery disease requiring PCI in 2006, hypertension, diabetes mellitus, chronic kidney disease who presents with chest pain and nausea.  For the past 2-3 weeks, she has had episodic left chest pain located in the upper midclavicular region of her left chest.  She can pinpoint the location with two fingers and says that it lasts seconds and occurs any time, unassociated with activity.  She has no other associated symptoms.  Today, she had the same chest pain, this time associated with nausea which eventually resolved.  Given the association with nausea and her overall concern about her heart, she decided to present for further evaluation and management.  She is currently chest pain-free with no nausea.  She similarly denies shortness of breath, palpitations or syncope.  PAST MEDICAL HISTORY: 1. Coronary artery disease status post LAD PCI in 2006. 2. Hypertension. 3. Hyperlipidemia. 4. Diabetes mellitus with fairly pronounced peripheral neuropathy. 5. GERD. 6. Anemia. 7. Chronic kidney disease with a baseline creatinine from 1.7 to 1.9. 8. Status post cholecystectomy. 9. Status post hysterectomy.  SOCIAL HISTORY:  She is a lifelong nonsmoker who is married.  Her husband is here this evening as is her daughter.  FAMILY HISTORY:  Noncontributory.  REVIEW OF SYSTEMS:  As per HPI, otherwise is comprehensively  negative.  ALLERGIES:  SULFA and ACTOS.  MEDICATIONS: 1. Lovastatin 40 mg p.o. daily. 2. Carvedilol 6.25 mg p.o. b.i.d. 3. Prilosec 20 mg p.o. daily. 4. Lantus 20 units nightly. 5. Lisinopril 2.5 mg p.o. daily. 6. Aspirin 81 mg daily.  PHYSICAL EXAMINATION:  VITAL SIGNS:  On exam, her heart rate is 72, her blood pressure is 156/70.  She is breathing comfortably with a respiratory rate of 14 in the setting 100% on room air.   GENERAL:  She is in no acute distress with no evidence of accessory muscle use with breathing. HEENT:  Notable for a supple neck without masses or lymphadenopathy. Her JVP is normal. LUNGS:  Clear to auscultation bilaterally with no crackles or wheezes present. CARDIAC:  Notable for a normal S1-S2 with a regular rate and no murmurs or rubs are appreciated. ABDOMEN:  Soft, nontender, nondistended with positive bowel sounds and no abdominal bruits appreciated. EXTREMITIES:  Warm and well-perfused.  No lower extremity edema. NEUROLOGICAL:  She is alert and oriented x3 with no focal neurologic deficits detected.  LABORATORY DATA:  Her white count is 7, her hematocrit 35, platelets are 271,000.  Sodium 137, potassium 4, chloride 103, bicarb 24, BUN 37, creatinine of 1.7 with a glucose of 119.  Her initial set of cardiac enzymes are  notable for a CK of 45, CK-MB of 3.0, and troponin of zero.  Catheterization in 2007, revealed an LAD with a 30% lesion in the midportion of the vessel, an LAD to D1 with an 80% lesion, and an LAD to D2 with a 99% lesion.  Both of her diagonal vessels were deemed too small for PCI.  She had a 40% lesion in her OM1 and a 30% proximal RCA lesion accompanied by a 40% mid RCA lesion.  She had severe distal RCA disease not thought amenable to PCI.  An electrocardiogram performed in the emergency room here today demonstrates normal sinus rhythm with no ST changes or deviation.  IMPRESSION:  This is a 77 year old woman with a history  of underlying coronary artery disease presenting with an episode of atypical chest pain and nausea. She has no enzymatic or electrocardiographic evidence of ischemia thus far.  I do not primarily suspect an acute coronary syndrome as the reason for her pain, though given her history of coronary artery disease, it would be prudent to provide further reassurance via noninvasive testing.  We will admit her to telemetry for serial enzyme measurements overnight as well as serial EKGs.  We will obtain her home blood pressure medicines as well as her aspirin, beta blockade, and statin therapy.  We will plan for a stress nuclear in the morning to provide reassurance by excluding inducible ischemia.  I suspect that her stress test results will be reassuring.  From the standpoint of her diabetes, we will continue her insulin regimen, though she will receive a half dose of her Lantus this evening in advance of her n.p.o. status for tomorrow morning.  The plan was discussed with the patient as well as with her family, their questions were answered to the best of my ability, and they are comfortable with the current plan.          ______________________________ Zacarias Pontes, MD     DM/MEDQ  D:  01/06/2011  T:  01/06/2011  Job:  [id number]  Electronically Signed by Zacarias Pontes MD on 01/07/2011 04:22:34 AM

## 2011-01-13 NOTE — Discharge Summary (Signed)
[name], [name] NO.:  [id number]  MEDICAL RECORD NO.:  [id number]  LOCATION:  3731                         FACILITY:  [facility]  PHYSICIAN:  Marca Ancona, MD      DATE OF BIRTH:  10-18-1933  DATE OF ADMISSION:  01/06/2011 DATE OF DISCHARGE:  01/07/2011                              DISCHARGE SUMMARY   PRIMARY CARDIOLOGIST:  Bevelyn Buckles. Bensimhon, MD  DISCHARGE DIAGNOSES: 1. Coronary artery disease status post left anterior descending     percutaneous coronary intervention in 2006.     a.     Aypical chest pain, ruled out for myocardial infarction      this admission. 2. Hypertension.  SECONDARY DIAGNOSES: 1. Hyperlipidemia. 2. Diabetes mellitus type 2. 3. Gastroesophageal reflux disease. 4. Chronic kidney disease with baseline creatinine 1.7 to 1.9. 5. Anemia.  ALLERGIES:  SULFA and ACTOS.  PROCEDURES/DIAGNOSTICS PERFORMED DURING HOSPITALIZATION: 1. Chest x-ray on January 06, 2011:  Interval minimal linear     atelectasis or scarring of the left lateral lung base. 2. EKG showing normal sinus rhythm at a rate of 55 beats per minute     with no acute ST-T-wave changes.  REASON FOR HOSPITALIZATION:  This is a 77 year old female with the above- stated problem list who has been complaining of episodic chest pain over the last 2-3 weeks.  The patient has a pinpoint location in the upper mid clavicle region of her left chest that comes at any time, not associated with activity, lasting several seconds.  Pain has been coming and going over the last several weeks.  Yesterday, she had some nausea associated with the event and presented to the emergency department for further evaluation.  The patient was chest pain free as well without nausea upon presentation.  Initial cardiac markers were negative and EKG was without acute changes.  The patient was admitted for further evaluation.  HOSPITAL COURSE:  The patient was admitted to the telemetry unit.   She ruled out for myocardial infarction with cardiac enzymes being negative x3.  She had no further complaints of chest pain.  Her EKG was without acute changes.  It was deemed that the patient's pain is different than her prior angina.  With the symptoms lasting several seconds and being in pinpoint location it was felt that the patient's pain was atypical ruling out for myocardial infarction.  She was felt stable for discharge with further noninvasive testing in the outpatient setting.  Of note, the patient's blood pressure is running with systolic 140s-160s, but at this time she states that her blood pressure runs low at times when she checks at home, therefore no medication adjustments will be done at this time.  We will follow up in the outpatient setting.  On the day of discharge, Dr. Shirlee Latch evaluated the patient and noted her stable.  She had no further chest pain.  She had no further complaints of nausea.  DISCHARGE LABS:  Cardiac enzymes negative x3.  Sodium 137, potassium 4, BUN 37, creatinine 1.68.  DISCHARGE MEDICATIONS: 1. Aspirin enteric-coated 81 mg 2 tablets daily. 2. Carvedilol 6.25 mg 1 tablet twice daily. 3. Fenofibrate 54 mg 1 tablet daily. 4. Lantus SoloStar Pen  20 units subcutaneously daily. 5. Lisinopril 2.5 mg 1 tablet daily. 6. Lovastatin 40 mg 1 tablet daily. 7. Nitroglycerin sublingual 0.4 mg 1 tablet by mouth every 5 minutes     and up to three doses for chest pain. 8. NovoLog continue.  FOLLOWUP PLANS AND INSTRUCTIONS: 1. The patient will follow up for a stress test at [facility]     on January 22, 2011, at 8:30 a.m.  She is not to eat or drink     after midnight the night prior to it.  She may take small sips of     water with her medication in the morning.  She is going to follow     up with Tereso Newcomer, physician assistant for Dr. Gala Romney at     11:30 that day. 2. The patient should increase activity slowly. 3. The patient should  continue low-sodium heart-healthy diet. 4. The patient should avoid straining to cause chest pain or shortness     of breath. 5. The patient is to call the office in the interim for any problems     or concerns.  DISCHARGE TIME:  Greater than 30 minutes with physician and physician assistant time.     Leonette Monarch, PA-C   ______________________________ Marca Ancona, MD    NB/MEDQ  D:  01/07/2011  T:  01/07/2011  Job:  [id number]  cc:   Bevelyn Buckles. Bensimhon, MD  Electronically Signed by Alen Blew P.A. on 01/11/2011 12:19:33 PM Electronically Signed by Marca Ancona MD on 01/13/2011 12:01:41 AM

## 2011-01-14 ENCOUNTER — Other Ambulatory Visit: Payer: Self-pay | Admitting: Family

## 2011-01-15 ENCOUNTER — Encounter: Payer: Self-pay | Admitting: *Deleted

## 2011-01-21 ENCOUNTER — Telehealth: Payer: Self-pay | Admitting: Internal Medicine

## 2011-01-21 MED ORDER — INSULIN GLARGINE 100 UNIT/ML ~~LOC~~ SOLN: 17.0000 [IU] | Freq: Every day | SUBCUTANEOUS | Status: DC

## 2011-01-21 NOTE — Telephone Encounter (Signed)
rx sent in electronically 

## 2011-01-21 NOTE — Telephone Encounter (Signed)
Patient has upcoming visit with Dr. Artist Pais on 10/24. Would like Dr. Artist Pais to write her a new rx for Lantus Solostar 100 units. Patient wants 90 day supply instead of 30 day to help her save money on rx. [facility] on [address].

## 2011-01-22 ENCOUNTER — Encounter: Payer: Medicare Other | Admitting: Physician Assistant

## 2011-01-22 ENCOUNTER — Encounter (HOSPITAL_COMMUNITY): Payer: Medicare Other | Admitting: Radiology

## 2011-02-01 ENCOUNTER — Encounter: Payer: Self-pay | Admitting: Internal Medicine

## 2011-02-01 ENCOUNTER — Ambulatory Visit (INDEPENDENT_AMBULATORY_CARE_PROVIDER_SITE_OTHER): Payer: Medicare Other | Admitting: Internal Medicine

## 2011-02-01 VITALS — BP 120/70 | HR 65 | Temp 97.8°F | Resp 16 | Ht 67.0 in | Wt 184.0 lb

## 2011-02-01 DIAGNOSIS — Z23 Encounter for immunization: Secondary | ICD-10-CM

## 2011-02-01 DIAGNOSIS — N390 Urinary tract infection, site not specified: Secondary | ICD-10-CM

## 2011-02-01 DIAGNOSIS — E119 Type 2 diabetes mellitus without complications: Secondary | ICD-10-CM

## 2011-02-01 LAB — POCT URINALYSIS DIPSTICK
Bilirubin, UA: NEGATIVE
Glucose, UA: NEGATIVE
Ketones, UA: NEGATIVE
Nitrite, UA: POSITIVE
Spec Grav, UA: 1.015
Urobilinogen, UA: 0.2
pH, UA: 6.5

## 2011-02-01 MED ORDER — CIPROFLOXACIN HCL 250 MG PO TABS: 250.0000 mg | ORAL_TABLET | Freq: Two times a day (BID) | ORAL | Status: AC

## 2011-02-01 NOTE — Assessment & Plan Note (Signed)
ua obtained and suggestive of UTI. Obtain urine cx. Begin cipro 250mg  po bid x 7days. Followup if no improvement or worsening.

## 2011-02-01 NOTE — Progress Notes (Signed)
  Subjective:    Patient ID: [name], female    DOB: 1933/07/24, 77 y.o.   MRN: [id number]  HPI Pt presents to clinic for evaluation of possible UTI. Notes one day h/o urinary frequency, lbp without radiation, chills, and left groin pain. Denies fever, n/v, hematuria. No alleviating or exacerbating factors. Taking no medication for the problem. H/o dm with fsbs reported 91-100 with single hypoglycemic value of 40 that occurred with increased activity. No further hypoglycemia and resolved with food. This am fsbs higher at 154. Reviewed CRI with last creatinine 1.78. No other complaints.  Past Medical History  Diagnosis Date  . Hypertension   . Hyperlipidemia   . GERD (gastroesophageal reflux disease)   . Anemia   . CAD (coronary artery disease)     status Taxus drug-eluding stent to the LAD December 2006  . Edema     lower extremity with normal BNP  . Encounter for long-term (current) use of medications   . Hyperkalemia   . Glossitis   . Leg pain, left   . Routine general medical examination at a health care facility   . Chronic renal disease     mild  . Pyuria   . Anemia, iron deficiency   . UTI (urinary tract infection)   . Pain in foot     bilateral  . Osteopenia   . Diverticulosis of colon   . Diabetes mellitus type II   . Polyneuropathy    Past Surgical History  Procedure Date  . Cholecystectomy   . Total abdominal hysterectomy 1969    no BSO  . Ptca     stent to LAD in 03/2005, recatheterization inJanuarry 2007 for recurrent chest pain  that showed a patent LAD stent with normal LV function  . Esophagogastroduodenoscopy 04/15/2006  . Cardiovascular stress test 02/24/2006  . Electrocardiogram 06/18/2006    reports that she has never smoked. She has never used smokeless tobacco. She reports that she does not drink alcohol or use illicit drugs. family history includes Heart disease in her mother and sister. Allergies  Allergen Reactions  . Pioglitazone    REACTION: Edema  . Sulfonamide Derivatives        Review of Systems see hpi     Objective:   Physical Exam  Nursing note and vitals reviewed. Constitutional: She appears well-developed and well-nourished. No distress.  HENT:  Head: Normocephalic and atraumatic.  Eyes: Conjunctivae are normal. No scleral icterus.  Neurological: She is alert.  Skin: She is not diaphoretic.  Psychiatric: She has a normal mood and affect.          Assessment & Plan:

## 2011-02-01 NOTE — Assessment & Plan Note (Signed)
Overall improved control. Monitor fsbs closely during infxn for hyper or hyoglycemia.

## 2011-02-04 LAB — URINE CULTURE: Colony Count: >=100000

## 2011-02-13 ENCOUNTER — Other Ambulatory Visit: Payer: Self-pay | Admitting: Internal Medicine

## 2011-02-13 DIAGNOSIS — E119 Type 2 diabetes mellitus without complications: Secondary | ICD-10-CM

## 2011-02-13 DIAGNOSIS — E785 Hyperlipidemia, unspecified: Secondary | ICD-10-CM

## 2011-02-13 LAB — CBC
HCT: 38.1 % (ref 36.0–46.0)
Hemoglobin: 12.5 g/dL (ref 12.0–15.0)
MCH: 30.8 pg (ref 26.0–34.0)
MCHC: 32.8 g/dL (ref 30.0–36.0)
RBC: 4.06 MIL/uL (ref 3.87–5.11)

## 2011-02-14 LAB — MICROALBUMIN / CREATININE URINE RATIO
Creatinine, Urine: 71.8 mg/dL
Microalb Creat Ratio: 7 mg/g (ref 0.0–30.0)

## 2011-02-15 LAB — BASIC METABOLIC PANEL
BUN: 36 mg/dL — ABNORMAL HIGH (ref 6–23)
CO2: 25 mEq/L (ref 19–32)
Calcium: 9.6 mg/dL (ref 8.4–10.5)
Glucose, Bld: 118 mg/dL — ABNORMAL HIGH (ref 70–99)
Potassium: 4.8 mEq/L (ref 3.5–5.3)
Sodium: 143 mEq/L (ref 135–145)

## 2011-02-15 LAB — LIPID PANEL
HDL: 58 mg/dL (ref >39)
Total CHOL/HDL Ratio: 2.8 Ratio
Triglycerides: 102 mg/dL (ref <150)

## 2011-02-15 LAB — HEPATIC FUNCTION PANEL
ALT: 13 U/L (ref 0–35)
AST: 18 U/L (ref 0–37)
Albumin: 4.2 g/dL (ref 3.5–5.2)
Alkaline Phosphatase: 46 U/L (ref 39–117)
Total Protein: 7 g/dL (ref 6.0–8.3)

## 2011-02-18 ENCOUNTER — Ambulatory Visit: Payer: Medicare Other | Admitting: Internal Medicine

## 2011-02-20 ENCOUNTER — Ambulatory Visit (INDEPENDENT_AMBULATORY_CARE_PROVIDER_SITE_OTHER): Payer: Medicare Other | Admitting: Internal Medicine

## 2011-02-20 ENCOUNTER — Encounter: Payer: Self-pay | Admitting: Internal Medicine

## 2011-02-20 DIAGNOSIS — I10 Essential (primary) hypertension: Secondary | ICD-10-CM

## 2011-02-20 DIAGNOSIS — E119 Type 2 diabetes mellitus without complications: Secondary | ICD-10-CM

## 2011-02-20 DIAGNOSIS — K219 Gastro-esophageal reflux disease without esophagitis: Secondary | ICD-10-CM

## 2011-02-20 NOTE — Progress Notes (Signed)
Subjective:    Patient ID: [name], female    DOB: 1934/01/13, 77 y.o.   MRN: [id number]  HPI   77 year old white female with history of type 2 diabetes, hypertension and peripheral neuropathy for followup. Patient reports episode of left upper chest pain 3 weeks ago associated with nausea. She was seen at [facility] and monitored overnight. She was supposed to go for followup stress test but she decided to cancel on her own. She states that her chest pain has completely resolved she attributes to flare from gastroesophageal reflux disease. It has been approximately one year since her last stress test which was reported normal one year ago.  Type 2 diabetes-her blood sugars have been fairly well-controlled. She has rare hypoglycemia. She is currently using 20 units of Lantus at bedtime and NovoLog 10 units 3 times a day before meals.   Hypertension -she reports occasional low blood pressure. It is not associated with any of her medications that she is aware of.  Review of Systems  negative for chest pain, negative for shortness of breath  Past Medical History  Diagnosis Date  . Hypertension   . Hyperlipidemia   . GERD (gastroesophageal reflux disease)   . Anemia   . CAD (coronary artery disease)     status Taxus drug-eluding stent to the LAD December 2006  . Edema     lower extremity with normal BNP  . Encounter for long-term (current) use of medications   . Hyperkalemia   . Glossitis   . Leg pain, left   . Routine general medical examination at a health care facility   . Chronic renal disease     mild  . Pyuria   . Anemia, iron deficiency   . UTI (urinary tract infection)   . Pain in foot     bilateral  . Osteopenia   . Diverticulosis of colon   . Diabetes mellitus type II   . Polyneuropathy     History   Social History  . Marital Status: Married    Spouse Name: N/A    Number of Children: N/A  . Years of Education: N/A   Occupational History  .  Not on file.   Social History Main Topics  . Smoking status: Never Smoker   . Smokeless tobacco: Never Used  . Alcohol Use: No  . Drug Use: No  . Sexually Active: Not on file   Other Topics Concern  . 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 Cholecystectomy   . Total abdominal hysterectomy 1969    no BSO  . Ptca     stent to LAD in 03/2005, recatheterization inJanuarry 2007 for recurrent chest pain  that showed a patent LAD stent with normal LV function  . Esophagogastroduodenoscopy 04/15/2006  . Cardiovascular stress test 02/24/2006  . Electrocardiogram 06/18/2006    Family History  Problem Relation Age of Onset  . Heart disease Mother   . Heart disease Sister     Allergies  Allergen Reactions  . Pioglitazone     REACTION: Edema  . Sulfonamide Derivatives     Current Outpatient Prescriptions on File Prior to Visit  Medication Sig Dispense Refill  . aspirin 81 MG tablet Take 81 mg by mouth. 2 tablets by mouth once daily       .  carvedilol (COREG) 6.25 MG tablet Take 1 tablet (6.25 mg total) by mouth 2 (two) times daily with a meal.  180 tablet  1  . fenofibrate 54 MG tablet Take 1 tablet (54 mg total) by mouth daily.  90 tablet  1  . FREESTYLE LITE test strip USE TO CHECK BLOOD SUGAR TWO TIMES A DAY.  180 each  1  . insulin aspart (NOVOLOG) 100 UNIT/ML injection 10 Units 3 (three) times daily before meals.       . insulin glargine (LANTUS) 100 UNIT/ML injection Inject 20 Units into the skin at bedtime.        . Insulin Pen Needle 31G X 8 MM MISC 1 each by Does not apply route 2 (two) times daily before a meal.  100 each  1  . lisinopril (PRINIVIL,ZESTRIL) 2.5 MG tablet TAKE ONE TABLET BY MOUTH EVERY DAY  30 tablet  1  . lovastatin (MEVACOR) 40 MG tablet TAKE ONE TABLET BY MOUTH AT BEDTIME  30 tablet  5  . Multiple Vitamin  (MULTIVITAMIN) tablet Take 1 tablet by mouth daily.        . nitroGLYCERIN (NITROSTAT) 0.4 MG SL tablet Place 0.4 mg under the tongue every 5 (five) minutes as needed.        Marland Kitchen omeprazole (PRILOSEC) 20 MG capsule Take 20 mg by mouth daily.        Marland Kitchen triamcinolone (KENALOG) 0.1 % cream Apply topically 2 (two) times daily. For one week         BP 132/64  Pulse 76  Temp(Src) 98.6 F (37 C) (Oral)  Wt 185 lb (83.915 kg)       Objective:   Physical Exam   Constitutional: Appears well-developed and well-nourished. No distress.  Head: Normocephalic and atraumatic.  Neck: Normal range of motion. Neck supple. No thyromegaly present. No carotid bruit Cardiovascular: Normal rate, regular rhythm and normal heart sounds.  Exam reveals no gallop and no friction rub.   Pulmonary/Chest: Effort normal and breath sounds normal.  No wheezes. No rales.  Abdominal: Soft. Bowel sounds are normal. No mass. There is no tenderness.  Neurological: Alert. No cranial nerve deficit.  Skin: Skin is warm and dry.  Psychiatric: Normal mood and affect. Behavior is normal.    Assessment & Plan:

## 2011-02-20 NOTE — Assessment & Plan Note (Signed)
Patient reports intermittent hypotension of unclear etiology.   Carvedilol may be possible cause. Patient advised to try taking one half dose of 6.25 mg twice a day

## 2011-02-20 NOTE — Assessment & Plan Note (Signed)
Blood sugar control has improved. Her most recent A1c 7.4.   She has rare hypoglycemia. Continue current insulin regimen.

## 2011-02-20 NOTE — Assessment & Plan Note (Signed)
Recent atypical chest pain attributed to GERD.  Continue omeprazole daily

## 2011-02-20 NOTE — Patient Instructions (Signed)
Take 1/2 of carvedilol 6.25 mg 2 x daily if you experience persistent dizziness. Please complete the following lab tests before your next follow up appointment: BMET, A1c - 250.00

## 2011-03-04 ENCOUNTER — Encounter: Payer: Self-pay | Admitting: Cardiovascular Disease

## 2011-03-04 ENCOUNTER — Ambulatory Visit (INDEPENDENT_AMBULATORY_CARE_PROVIDER_SITE_OTHER): Payer: Medicare Other | Admitting: Cardiovascular Disease

## 2011-03-04 VITALS — BP 138/76 | HR 67 | Ht 60.0 in | Wt 186.0 lb

## 2011-03-04 DIAGNOSIS — I2510 Atherosclerotic heart disease of native coronary artery without angina pectoris: Secondary | ICD-10-CM

## 2011-03-04 DIAGNOSIS — I10 Essential (primary) hypertension: Secondary | ICD-10-CM

## 2011-03-04 MED ORDER — ASPIRIN EC 81 MG PO TBEC: 81.0000 mg | DELAYED_RELEASE_TABLET | Freq: Every day | ORAL | Status: AC

## 2011-03-04 NOTE — Assessment & Plan Note (Signed)
BP is well controlled. No changes.  

## 2011-03-04 NOTE — Progress Notes (Signed)
History of Present Illness:77 yo WF with a history of CAD status post previous Taxus LAD stent Dec 2006, HTN, HLD, and diabetes. She also has severe neuropathy. She has been followed in the past by Dr. Gala Romney. She has been doing well. No chest pain, SOB, palpitations, near syncope or syncope.   Dr. Artist Pais is following her lipids.   Past Medical History  Diagnosis Date  . Hypertension   . Hyperlipidemia   . GERD (gastroesophageal reflux disease)   . Anemia   . CAD (coronary artery disease)     status Taxus drug-eluding stent to the LAD December 2006  . Edema     lower extremity with normal BNP  . Encounter for long-term (current) use of medications   . Hyperkalemia   . Glossitis   . Leg pain, left   . Routine general medical examination at a health care facility   . Chronic renal disease     mild  . Pyuria   . Anemia, iron deficiency   . UTI (urinary tract infection)   . Pain in foot     bilateral  . Osteopenia   . Diverticulosis of colon   . Diabetes mellitus type II   . Polyneuropathy     Past Surgical History  Procedure Date  . Cholecystectomy   . Total abdominal hysterectomy 1969    no BSO  . Ptca     stent to LAD in 03/2005, recatheterization inJanuarry 2007 for recurrent chest pain  that showed a patent LAD stent with normal LV function  . Esophagogastroduodenoscopy 04/15/2006  . Cardiovascular stress test 02/24/2006  . Electrocardiogram 06/18/2006    Current Outpatient Prescriptions  Medication Sig Dispense Refill  . aspirin 81 MG tablet Take 81 mg by mouth. 2 tablets by mouth once daily       . carvedilol (COREG) 6.25 MG tablet Take 1 tablet (6.25 mg total) by mouth 2 (two) times daily with a meal.  180 tablet  1  . fenofibrate 54 MG tablet Take 1 tablet (54 mg total) by mouth daily.  90 tablet  1  . FREESTYLE LITE test strip USE TO CHECK BLOOD SUGAR TWO TIMES A DAY.  180 each  1  . insulin aspart (NOVOLOG) 100 UNIT/ML injection 10 Units 3 (three) times daily  before meals.       . insulin glargine (LANTUS) 100 UNIT/ML injection Inject 20 Units into the skin at bedtime.        . Insulin Pen Needle 31G X 8 MM MISC 1 each by Does not apply route 2 (two) times daily before a meal.  100 each  1  . lisinopril (PRINIVIL,ZESTRIL) 2.5 MG tablet TAKE ONE TABLET BY MOUTH EVERY DAY  30 tablet  1  . lovastatin (MEVACOR) 40 MG tablet TAKE ONE TABLET BY MOUTH AT BEDTIME  30 tablet  5  . Multiple Vitamin (MULTIVITAMIN) tablet Take 1 tablet by mouth daily.        . nitroGLYCERIN (NITROSTAT) 0.4 MG SL tablet Place 0.4 mg under the tongue every 5 (five) minutes as needed.        Marland Kitchen omeprazole (PRILOSEC) 20 MG capsule Take 20 mg by mouth daily.        Marland Kitchen triamcinolone (KENALOG) 0.1 % cream Apply topically 2 (two) times daily. For one week         Allergies  Allergen Reactions  . Pioglitazone     REACTION: Edema  . Sulfonamide Derivatives     History  Social History  . Marital Status: Married    Spouse Name: N/A    Number of Children: N/A  . Years of Education: N/A   Occupational History  . Not on file.   Social History Main Topics  . Smoking status: Never Smoker   . Smokeless tobacco: Never Used  . Alcohol Use: No  . Drug Use: No  . Sexually Active: Not on file   Other Topics Concern  . 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 Heart disease Mother   . Heart disease Sister     Review of Systems:  As stated in the HPI and otherwise negative.   BP 138/76  Pulse 67  Ht 5' (1.524 m)  Wt 186 lb (84.369 kg)  BMI 36.33 kg/m2  Physical Examination: General: Well developed, well nourished, NAD HEENT: OP clear, mucus membranes moist SKIN: warm, dry. No rashes. Neuro: No focal deficits Musculoskeletal: Muscle strength 5/5 all ext Psychiatric: Mood and affect normal Neck: No JVD, no carotid bruits,  no thyromegaly, no lymphadenopathy. Lungs:Clear bilaterally, no wheezes, rhonci, crackles Cardiovascular: Regular rate and rhythm. No murmurs, gallops or rubs. Abdomen:Soft. Bowel sounds present. Non-tender.  Extremities: No lower extremity edema. Pulses are 2 + in the bilateral DP/PT.  EKG: NSR, rate 69 bpm. Poor R wave progression.

## 2011-03-04 NOTE — Assessment & Plan Note (Signed)
Stable. No changes in med.

## 2011-03-04 NOTE — Patient Instructions (Addendum)
Your physician wants you to follow-up in:  12 months.  You will receive a reminder letter in the mail two months in advance. If you don't receive a letter, please call our office to schedule the follow-up appointment.  Your physician has recommended you make the following change in your medication: Decrease aspirin to 81 mg by mouth daily   

## 2011-03-12 ENCOUNTER — Ambulatory Visit: Payer: Medicare Other | Admitting: Internal Medicine

## 2011-03-12 ENCOUNTER — Ambulatory Visit (INDEPENDENT_AMBULATORY_CARE_PROVIDER_SITE_OTHER): Payer: Medicare Other | Admitting: Internal Medicine

## 2011-03-12 ENCOUNTER — Encounter: Payer: Self-pay | Admitting: Internal Medicine

## 2011-03-12 VITALS — BP 116/60 | HR 67 | Temp 98.3°F | Resp 18 | Wt 187.0 lb

## 2011-03-12 DIAGNOSIS — S91109A Unspecified open wound of unspecified toe(s) without damage to nail, initial encounter: Secondary | ICD-10-CM

## 2011-03-12 MED ORDER — LISINOPRIL 2.5 MG PO TABS: 2.5000 mg | ORAL_TABLET | Freq: Every day | ORAL | Status: DC

## 2011-03-17 DIAGNOSIS — S91109A Unspecified open wound of unspecified toe(s) without damage to nail, initial encounter: Secondary | ICD-10-CM | POA: Insufficient documentation

## 2011-03-17 NOTE — Progress Notes (Signed)
  Subjective:    Patient ID: [name], female    DOB: [birth year], 77 y.o.   MRN: [id number]  HPI Pt presents to clinic for ED follow up of toe injury. Several days ago suffered injury to left 5th toe with laceration. Received 5 sutures at ED with recommendation for removal of sutures in ~2wks. Given keflex at ed for ?1wk. No f/c or drainage. Denies pain secondary to neuropathy. Reports neg plain radiograph of foot. No other complaints.  Past Medical History  Diagnosis Date  . Hypertension   . Hyperlipidemia   . GERD (gastroesophageal reflux disease)   . Anemia   . CAD (coronary artery disease)     status Taxus drug-eluding stent to the LAD December 2006  . Edema     lower extremity with normal BNP  . Encounter for long-term (current) use of medications   . Hyperkalemia   . Glossitis   . Leg pain, left   . Routine general medical examination at a health care facility   . Chronic renal disease     mild  . Pyuria   . Anemia, iron deficiency   . UTI (urinary tract infection)   . Pain in foot     bilateral  . Osteopenia   . Diverticulosis of colon   . Diabetes mellitus type II   . Polyneuropathy    Past Surgical History  Procedure Date  . Cholecystectomy   . Total abdominal hysterectomy 1969    no BSO  . Ptca     stent to LAD in 03/2005, recatheterization inJanuarry 2007 for recurrent chest pain  that showed a patent LAD stent with normal LV function  . Esophagogastroduodenoscopy 04/15/2006  . Cardiovascular stress test 02/24/2006  . Electrocardiogram 06/18/2006    reports that she has never smoked. She has never used smokeless tobacco. She reports that she does not drink alcohol or use illicit drugs. family history includes Heart disease in her mother and sister. Allergies  Allergen Reactions  . Pioglitazone     REACTION: Edema  . Sulfonamide Derivatives        Review of Systems see hpi     Objective:   Physical Exam  Nursing note and vitals  reviewed. Constitutional: She appears well-developed and well-nourished. No distress.  HENT:  Head: Normocephalic and atraumatic.  Neurological: She is alert.  Skin: Skin is warm and dry. No rash noted. She is not diaphoretic. No erythema.       Left 5th toe: laceration noted. 5 suture lines present. No erythema or drainage appreciated.   Psychiatric: She has a normal mood and affect.          Assessment & Plan:

## 2011-03-17 NOTE — Assessment & Plan Note (Signed)
Continue abx tx. Schedule close follow up for re-evaluation and possible suture removal.

## 2011-03-22 ENCOUNTER — Ambulatory Visit (INDEPENDENT_AMBULATORY_CARE_PROVIDER_SITE_OTHER): Payer: Medicare Other | Admitting: Internal Medicine

## 2011-03-22 ENCOUNTER — Encounter: Payer: Self-pay | Admitting: Internal Medicine

## 2011-03-22 VITALS — BP 130/70 | HR 71 | Temp 97.8°F | Resp 16

## 2011-03-22 DIAGNOSIS — S91109A Unspecified open wound of unspecified toe(s) without damage to nail, initial encounter: Secondary | ICD-10-CM

## 2011-03-22 DIAGNOSIS — S91119A Laceration without foreign body of unspecified toe without damage to nail, initial encounter: Secondary | ICD-10-CM

## 2011-03-22 NOTE — Progress Notes (Signed)
  Subjective:    Patient ID: [name], female    DOB: 1933/11/12, 77 y.o.   MRN: [id number]  HPI Pt presents to clinic for follow up of toe laceration. Recent accidental laceration involving left 5th toe requiring 5 sutures to be placed at ED. Completed keflex without difficulty. Notes no drainage, fever or chills. Has been performing dressing changes and applying topical abx ointment as well.  No active complaint.  Past Medical History  Diagnosis Date  . Hypertension   . Hyperlipidemia   . GERD (gastroesophageal reflux disease)   . Anemia   . CAD (coronary artery disease)     status Taxus drug-eluding stent to the LAD December 2006  . Edema     lower extremity with normal BNP  . Encounter for long-term (current) use of medications   . Hyperkalemia   . Glossitis   . Leg pain, left   . Routine general medical examination at a health care facility   . Chronic renal disease     mild  . Pyuria   . Anemia, iron deficiency   . UTI (urinary tract infection)   . Pain in foot     bilateral  . Osteopenia   . Diverticulosis of colon   . Diabetes mellitus type II   . Polyneuropathy    Past Surgical History  Procedure Date  . Cholecystectomy   . Total abdominal hysterectomy 1969    no BSO  . Ptca     stent to LAD in 03/2005, recatheterization inJanuarry 2007 for recurrent chest pain  that showed a patent LAD stent with normal LV function  . Esophagogastroduodenoscopy 04/15/2006  . Cardiovascular stress test 02/24/2006  . Electrocardiogram 06/18/2006    reports that she has never smoked. She has never used smokeless tobacco. She reports that she does not drink alcohol or use illicit drugs. family history includes Heart disease in her mother and sister. Allergies  Allergen Reactions  . Pioglitazone     REACTION: Edema  . Sulfonamide Derivatives        Review of Systems see hpi     Objective:   Physical Exam  Nursing note and vitals reviewed. Constitutional: She  appears well-developed and well-nourished. No distress.  HENT:  Head: Normocephalic and atraumatic.  Neurological: She is alert.  Skin: Skin is warm and dry. She is not diaphoretic.       Left 5th toe wound well approximated. No discharge or erythema. NT (h/o neuropathy).  Psychiatric: She has a normal mood and affect.          Assessment & Plan:

## 2011-03-22 NOTE — Assessment & Plan Note (Signed)
Wound appears well healing and without evidence of active infection. 5 sutures removed without complication. Pt to continue to monitor the area for s/s infxn (s/s reviewed). Follow up with any concerns.

## 2011-04-02 ENCOUNTER — Other Ambulatory Visit: Payer: Self-pay | Admitting: Internal Medicine

## 2011-04-02 ENCOUNTER — Telehealth: Payer: Self-pay | Admitting: Internal Medicine

## 2011-04-02 NOTE — Telephone Encounter (Signed)
Rx refill sent to pharmacy. 

## 2011-04-02 NOTE — Telephone Encounter (Signed)
Refill-lovastatin 40mg  tab. Take one tablet by mouth at bedtime. Qty 30 last fill 11.4.12

## 2011-04-03 MED ORDER — LOVASTATIN 40 MG PO TABS: 40.0000 mg | ORAL_TABLET | Freq: Every day | ORAL | Status: DC

## 2011-04-03 NOTE — Telephone Encounter (Signed)
Rx refill sent to Pharmacy on 04/02/2011. Call placed to pharmacy at [phone], pharmacy stated they did not receive a refill on Lovastatin for patient.  Rx for Lovastatin resent to pharmacy.

## 2011-04-16 ENCOUNTER — Encounter (HOSPITAL_COMMUNITY): Payer: Self-pay | Admitting: Emergency Medicine

## 2011-04-16 ENCOUNTER — Emergency Department (HOSPITAL_COMMUNITY): Payer: Medicare Other

## 2011-04-16 ENCOUNTER — Emergency Department (HOSPITAL_COMMUNITY)
Admission: EM | Admit: 2011-04-16 | Discharge: 2011-04-17 | Disposition: A | Discharge status: Home / Self Care | Payer: Medicare Other | Attending: Emergency Medicine | Admitting: Emergency Medicine

## 2011-04-16 DIAGNOSIS — Z7982 Long term (current) use of aspirin: Secondary | ICD-10-CM | POA: Insufficient documentation

## 2011-04-16 DIAGNOSIS — Z79899 Other long term (current) drug therapy: Secondary | ICD-10-CM | POA: Insufficient documentation

## 2011-04-16 DIAGNOSIS — E119 Type 2 diabetes mellitus without complications: Secondary | ICD-10-CM | POA: Insufficient documentation

## 2011-04-16 DIAGNOSIS — I10 Essential (primary) hypertension: Secondary | ICD-10-CM | POA: Insufficient documentation

## 2011-04-16 DIAGNOSIS — K219 Gastro-esophageal reflux disease without esophagitis: Secondary | ICD-10-CM | POA: Insufficient documentation

## 2011-04-16 DIAGNOSIS — I2510 Atherosclerotic heart disease of native coronary artery without angina pectoris: Secondary | ICD-10-CM | POA: Insufficient documentation

## 2011-04-16 DIAGNOSIS — M79609 Pain in unspecified limb: Secondary | ICD-10-CM | POA: Insufficient documentation

## 2011-04-16 DIAGNOSIS — M79606 Pain in leg, unspecified: Secondary | ICD-10-CM

## 2011-04-16 DIAGNOSIS — Z794 Long term (current) use of insulin: Secondary | ICD-10-CM | POA: Insufficient documentation

## 2011-04-16 DIAGNOSIS — E785 Hyperlipidemia, unspecified: Secondary | ICD-10-CM | POA: Insufficient documentation

## 2011-04-16 DIAGNOSIS — M25569 Pain in unspecified knee: Secondary | ICD-10-CM | POA: Insufficient documentation

## 2011-04-16 NOTE — ED Notes (Signed)
PT. REPORTS PROGRESSING RIGHT KNEE PAIN FOR 4 DAYS , DENIES INJURY OR FALL , PAIN WORSE WITH MOVEMENT . STATES SHE WAS DIAGNOSED WITH NEUROPATHY.

## 2011-04-17 ENCOUNTER — Ambulatory Visit (HOSPITAL_COMMUNITY)
Admission: RE | Admit: 2011-04-17 | Discharge: 2011-04-17 | Disposition: A | Discharge status: Home / Self Care | Payer: Medicare Other | Source: Ambulatory Visit | Attending: Internal Medicine | Admitting: Internal Medicine

## 2011-04-17 DIAGNOSIS — M79609 Pain in unspecified limb: Secondary | ICD-10-CM

## 2011-04-17 DIAGNOSIS — R52 Pain, unspecified: Secondary | ICD-10-CM

## 2011-04-17 MED ORDER — OXYCODONE-ACETAMINOPHEN 5-325 MG PO TABS
1.0000 | ORAL_TABLET | Freq: Once | ORAL | Status: AC
  Administered 2011-04-17: 1 via ORAL
  Filled 2011-04-17: qty 1

## 2011-04-17 MED ORDER — ENOXAPARIN SODIUM 80 MG/0.8ML ~~LOC~~ SOLN
80.0000 mg | SUBCUTANEOUS | Status: AC
  Administered 2011-04-17: 80 mg via SUBCUTANEOUS
  Filled 2011-04-17: qty 0.8

## 2011-04-17 NOTE — ED Provider Notes (Signed)
History    [name] with R knee pain. GRadual onset. Denies trauma. NO hx of similar. NO fever or chills. Denies hx of gout. No acute numbness weakness or loss of strength. No rash. No acute pain elsewhere. Pain is achy and a little worse with movement.  CSN: [id number] Arrival date & time: 04/16/2011 10:46 PM   First MD Initiated Contact with Patient 04/17/11 0052      Chief Complaint  Patient presents with  . Knee Pain    (Consider location/radiation/quality/duration/timing/severity/associated sxs/prior treatment) HPI  Past Medical History  Diagnosis Date  . Hypertension   . Hyperlipidemia   . GERD (gastroesophageal reflux disease)   . Anemia   . CAD (coronary artery disease)     status Taxus drug-eluding stent to the LAD December 2006  . Edema     lower extremity with normal BNP  . Encounter for long-term (current) use of medications   . Hyperkalemia   . Glossitis   . Leg pain, left   . Routine general medical examination at a health care facility   . Chronic renal disease     mild  . Pyuria   . Anemia, iron deficiency   . UTI (urinary tract infection)   . Pain in foot     bilateral  . Osteopenia   . Diverticulosis of colon   . Diabetes mellitus type II   . Polyneuropathy     Past Surgical History  Procedure Date  . Cholecystectomy   . Total abdominal hysterectomy 1969    no BSO  . Ptca     stent to LAD in 03/2005, recatheterization inJanuarry 2007 for recurrent chest pain  that showed a patent LAD stent with normal LV function  . Esophagogastroduodenoscopy 04/15/2006  . Cardiovascular stress test 02/24/2006  . Electrocardiogram 06/18/2006    Family History  Problem Relation Age of Onset  . Heart disease Mother   . Heart disease Sister     History  Substance Use Topics  . Smoking status: Never Smoker   . Smokeless tobacco: Never Used  . Alcohol Use: No    OB History    Grav Para Term Preterm Abortions TAB SAB Ect Mult Living                   Review of Systems  Review of symptoms negative unless otherwise noted in HPI.  Allergies  Pioglitazone and Sulfonamide derivatives  Home Medications   Current Outpatient Rx  Name Route Sig Dispense Refill  . ASPIRIN EC 81 MG PO TBEC Oral Take 1 tablet (81 mg total) by mouth daily. 150 tablet 2  . CALCIUM CARBONATE ANTACID 500 MG PO CHEW Oral Chew 1 tablet by mouth 3 (three) times daily as needed. For acid reflux     . CARVEDILOL 6.25 MG PO TABS Oral Take 1 tablet (6.25 mg total) by mouth 2 (two) times daily with a meal. 180 tablet 1  . FENOFIBRATE 54 MG PO TABS Oral Take 1 tablet (54 mg total) by mouth daily. 90 tablet 1  . FREESTYLE LITE TEST VI STRP  USE TO CHECK BLOOD SUGAR TWO TIMES A DAY. 180 each 1    Dx Code 250.00  . INSULIN ASPART 100 UNIT/ML [place] SOLN  10 Units 3 (three) times daily before meals.     . INSULIN GLARGINE 100 UNIT/ML [place] SOLN Subcutaneous Inject 20 Units into the skin at bedtime.      . INSULIN PEN NEEDLE 31G X 8 MM MISC  Does not apply 1 each by Does not apply route 2 (two) times daily before a meal. 100 each 1  . LISINOPRIL 2.5 MG PO TABS Oral Take 1 tablet (2.5 mg total) by mouth daily. 90 tablet 1  . LOVASTATIN 40 MG PO TABS Oral Take 1 tablet (40 mg total) by mouth at bedtime. 30 tablet 6  . ONE-DAILY MULTI VITAMINS PO TABS Oral Take 1 tablet by mouth daily.      Marland Kitchen NITROGLYCERIN 0.4 MG SL SUBL Sublingual Place 0.4 mg under the tongue every 5 (five) minutes as needed. For chest pain      BP 189/72  Pulse 72  Temp(Src) 98.3 F (36.8 C) (Oral)  Resp 16  SpO2 99%  Physical Exam  Nursing note and vitals reviewed. Constitutional: She appears well-developed and well-nourished. No distress.  HENT:  Head: Normocephalic and atraumatic.  Eyes: Conjunctivae are normal. Right eye exhibits no discharge. Left eye exhibits no discharge.  Neck: Neck supple.  Cardiovascular: Normal rate, regular rhythm and normal heart sounds.  Exam reveals no gallop and no  friction rub.   No murmur heard. Pulmonary/Chest: Effort normal and breath sounds normal. No respiratory distress.  Abdominal: Soft. She exhibits no distension. There is no tenderness.  Musculoskeletal: She exhibits no edema.       r knee grossly normal as compared to L. No significant effusion. No overlying skin changes. Pain not really reproducible with rom. No increased warmth. Mild tenderness posteriorly. Neurovascularly intact distally.  Neurological: She is alert.  Skin: Skin is warm and dry.  Psychiatric: She has a normal mood and affect. Her behavior is normal. Thought content normal.    ED Course  Procedures (including critical care time)  Labs Reviewed - No data to display Dg Knee Complete 4 Views Right  04/16/2011  *RADIOLOGY REPORT*  Clinical Data: Right knee pain and swelling.  RIGHT KNEE - COMPLETE 4+ VIEW  Comparison: None.  Findings: Mild tricompartmental DJD.  No acute fracture or dislocation.  There are enthesopathic changes at the patella. No definite joint effusion.  IMPRESSION: Mild degenerative changes.  No acute osseous abnormality.  Original Report Authenticated By: Waneta Martins, M.D.     1. Leg pain      2:30 AM Duplex RLE ordered for tomorrow at 8 am.   MDM  [name] with r knee pain. Consider djd, crsytal arthropathy, septic joint, dvt, bakers cyst.  No significant increase in pain with rom so less likely gout and doubt septic joint as well. Afebrile no increased wamrth. With pain in posterior aspect consider bakers cyst although could not palpate. Also consider dvt. Pt to return for doppler. scehduled for 0800 so will defer from lovenox at this time.         Raeford Razor, MD 04/24/11 1226

## 2011-04-17 NOTE — ED Notes (Signed)
Pt ambulated with a steady gait; VSS; A&Ox3; no signs of distress; respirations even and unlabored; skin warm and dry.

## 2011-04-17 NOTE — ED Notes (Signed)
MD at bedside to discuss dopplar study for AM.

## 2011-04-17 NOTE — Progress Notes (Signed)
*  PRELIMINARY RESULTS*  RLEV Duplex has been performed.  No obvious evidence of deep vein thrombosis involving the right lower extremity. No evidence of a Baker's Cysts on the right.    Mila Homer 04/17/2011, 9:59 AM

## 2011-05-21 ENCOUNTER — Other Ambulatory Visit: Payer: Self-pay | Admitting: Internal Medicine

## 2011-05-21 DIAGNOSIS — E119 Type 2 diabetes mellitus without complications: Secondary | ICD-10-CM

## 2011-05-21 LAB — BASIC METABOLIC PANEL
Calcium: 9.1 mg/dL (ref 8.4–10.5)
Creat: 1.57 mg/dL — ABNORMAL HIGH (ref 0.50–1.10)
Sodium: 140 mEq/L (ref 135–145)

## 2011-05-21 LAB — HEMOGLOBIN A1C
Hgb A1c MFr Bld: 7.5 % — ABNORMAL HIGH (ref <5.7)
Mean Plasma Glucose: 169 mg/dL — ABNORMAL HIGH (ref <117)

## 2011-05-27 ENCOUNTER — Telehealth: Payer: Self-pay | Admitting: Internal Medicine

## 2011-05-27 ENCOUNTER — Ambulatory Visit (INDEPENDENT_AMBULATORY_CARE_PROVIDER_SITE_OTHER): Payer: Medicare Other | Admitting: Internal Medicine

## 2011-05-27 ENCOUNTER — Encounter: Payer: Self-pay | Admitting: Internal Medicine

## 2011-05-27 VITALS — BP 112/60 | HR 66 | Temp 97.6°F | Resp 18 | Ht 67.0 in | Wt 187.0 lb

## 2011-05-27 DIAGNOSIS — Z23 Encounter for immunization: Secondary | ICD-10-CM

## 2011-05-27 DIAGNOSIS — I10 Essential (primary) hypertension: Secondary | ICD-10-CM

## 2011-05-27 DIAGNOSIS — E785 Hyperlipidemia, unspecified: Secondary | ICD-10-CM

## 2011-05-27 DIAGNOSIS — E119 Type 2 diabetes mellitus without complications: Secondary | ICD-10-CM

## 2011-05-27 MED ORDER — FENOFIBRATE 54 MG PO TABS: 54.0000 mg | ORAL_TABLET | Freq: Every day | ORAL | Status: DC

## 2011-05-27 NOTE — Telephone Encounter (Signed)
Lab order entered for April 2013.  

## 2011-05-27 NOTE — Patient Instructions (Signed)
Please schedule cbc, chem7, a1c (250.0) and lipid/lft (272.4) prior to next visit 

## 2011-06-07 NOTE — Progress Notes (Signed)
  Subjective:    Patient ID: [name], female    DOB: 1933-12-10, 78 y.o.   MRN: [id number]  HPI Pt presents to clinic for followup of multiple medical problems. Reports am blood sugars <130. One episode of hypoglycemia with unknown value due to waiting too long before lunch. a1c remains above goal.  BP reviewed nl. H/o CRI with stable creatinine without electrolyte disturbance.   Past Medical History  Diagnosis Date  . Hypertension   . Hyperlipidemia   . GERD (gastroesophageal reflux disease)   . Anemia   . CAD (coronary artery disease)     status Taxus drug-eluding stent to the LAD December 2006  . Edema     lower extremity with normal BNP  . Encounter for long-term (current) use of medications   . Hyperkalemia   . Glossitis   . Leg pain, left   . Routine general medical examination at a health care facility   . Chronic renal disease     mild  . Pyuria   . Anemia, iron deficiency   . UTI (urinary tract infection)   . Pain in foot     bilateral  . Osteopenia   . Diverticulosis of colon   . Diabetes mellitus type II   . Polyneuropathy    Past Surgical History  Procedure Date  . Cholecystectomy   . Total abdominal hysterectomy 1969    no BSO  . Ptca     stent to LAD in 03/2005, recatheterization inJanuarry 2007 for recurrent chest pain  that showed a patent LAD stent with normal LV function  . Esophagogastroduodenoscopy 04/15/2006  . Cardiovascular stress test 02/24/2006  . Electrocardiogram 06/18/2006    reports that she has never smoked. She has never used smokeless tobacco. She reports that she does not drink alcohol or use illicit drugs. family history includes Heart disease in her mother and sister. Allergies  Allergen Reactions  . Pioglitazone     REACTION: Edema  . Sulfonamide Derivatives     hives      Review of Systems see hpi     Objective:   Physical Exam  Physical Exam  Nursing note and vitals reviewed. Constitutional: Appears  well-developed and well-nourished. No distress.  HENT:  Head: Normocephalic and atraumatic.  Right Ear: External ear normal.  Left Ear: External ear normal.  Eyes: Conjunctivae are normal. No scleral icterus.  Neck: Neck supple. Carotid bruit is not present.  Cardiovascular: Normal rate, regular rhythm and normal heart sounds.  Exam reveals no gallop and no friction rub.   No murmur heard. Pulmonary/Chest: Effort normal and breath sounds normal. No respiratory distress. He has no wheezes. no rales.  Lymphadenopathy:    He has no cervical adenopathy.  Neurological:Alert.  Skin: Skin is warm and dry. Not diaphoretic.  Psychiatric: Has a normal mood and affect.        Assessment & Plan:  .

## 2011-06-08 NOTE — Assessment & Plan Note (Signed)
Stable. Tolerating statin/fibrate combination. Obtain lipid/lft prior to next visit

## 2011-06-08 NOTE — Assessment & Plan Note (Signed)
Normotensive and stable. Continue current regimen. Monitor bp as outpt and followup in clinic as scheduled.  

## 2011-06-08 NOTE — Assessment & Plan Note (Signed)
Am fsbs at goal but a1c remains above. Check fsbs qac and report values.

## 2011-06-19 ENCOUNTER — Telehealth: Payer: Self-pay | Admitting: *Deleted

## 2011-06-19 NOTE — Telephone Encounter (Signed)
Patient called and left voice message requesting a return phone call regarding her blood sugar readings.

## 2011-06-19 NOTE — Telephone Encounter (Signed)
Call placed to patient at [phone]. She states her blood sugar before breakfast was 99 and before lunch  45. She states her blood sugar has been low at lunch time for the past 2 weeks. She thought at the onset it would change, however the blood sugars continue to remain low at lunch. She states her current dose of insulin is10 units tid-ac and at bedtime she is taking 20 units. She would like to know if she should make any adjustments in her insulin.

## 2011-06-19 NOTE — Telephone Encounter (Signed)
She should decrease Breakfast dose of novolog to 5 units.  She can continue 10 units novolog AC lunch and dinner and continue 20 units of lantus at bedtime.  She should continue to check sugars and call us with readings in 1 week, sooner if recurrent sugars <80 on this regimen.

## 2011-06-19 NOTE — Telephone Encounter (Signed)
Call returned to patient to patient. She was informed per [facility] instructions and has verbalized understanding by repeating instructions. Patient acknowledged that she will call back in one week or sooner if necessary with blood sugar readings.

## 2011-06-20 ENCOUNTER — Telehealth: Payer: Self-pay | Admitting: Internal Medicine

## 2011-06-20 MED ORDER — CARVEDILOL 6.25 MG PO TABS: 6.2500 mg | ORAL_TABLET | Freq: Two times a day (BID) | ORAL | Status: DC

## 2011-06-20 NOTE — Telephone Encounter (Signed)
Rx refill sent to pharmacy. 

## 2011-06-20 NOTE — Telephone Encounter (Signed)
Refill carvedilol 6.25mg  tab qty180

## 2011-06-27 ENCOUNTER — Other Ambulatory Visit: Payer: Self-pay | Admitting: *Deleted

## 2011-06-27 DIAGNOSIS — E119 Type 2 diabetes mellitus without complications: Secondary | ICD-10-CM

## 2011-06-27 MED ORDER — INSULIN ASPART 100 UNIT/ML ~~LOC~~ SOLN: 10.0000 [IU] | Freq: Three times a day (TID) | SUBCUTANEOUS | Status: DC

## 2011-06-27 MED ORDER — INSULIN PEN NEEDLE 31G X 8 MM MISC: 1.0000 | Freq: Two times a day (BID) | Status: AC

## 2011-06-27 NOTE — Telephone Encounter (Signed)
Rx refills sent to pharmacy. 

## 2011-06-28 ENCOUNTER — Telehealth: Payer: Self-pay | Admitting: *Deleted

## 2011-06-28 MED ORDER — INSULIN ASPART 100 UNIT/ML ~~LOC~~ SOLN: 10.0000 [IU] | Freq: Three times a day (TID) | SUBCUTANEOUS | Status: DC

## 2011-06-28 NOTE — Telephone Encounter (Signed)
Rx for pens instead of vials requested by patient. Rx re-submitted to reflect patient request. Call placed to patient at [phone], she was made aware of update to pharmacy.

## 2011-08-05 LAB — HM MAMMOGRAPHY: HM Mammogram: NEGATIVE

## 2011-08-06 ENCOUNTER — Encounter: Payer: Self-pay | Admitting: Internal Medicine

## 2011-08-19 LAB — HEMOGLOBIN A1C
Hgb A1c MFr Bld: 7.2 % — ABNORMAL HIGH (ref <5.7)
Mean Plasma Glucose: 160 mg/dL — ABNORMAL HIGH (ref <117)

## 2011-08-19 LAB — CBC
Hemoglobin: 13 g/dL (ref 12.0–15.0)
MCH: 30.6 pg (ref 26.0–34.0)
MCHC: 32.3 g/dL (ref 30.0–36.0)
RDW: 13 % (ref 11.5–15.5)

## 2011-08-19 NOTE — Telephone Encounter (Signed)
Addended by: Mervin Kung A on: 08/19/2011 08:48 AM   Modules accepted: Orders

## 2011-08-19 NOTE — Telephone Encounter (Signed)
Pt presented to the lab, future order released and given to the lab. 

## 2011-08-20 LAB — BASIC METABOLIC PANEL
BUN: 29 mg/dL — ABNORMAL HIGH (ref 6–23)
CO2: 25 mEq/L (ref 19–32)
Calcium: 10.3 mg/dL (ref 8.4–10.5)
Chloride: 106 mEq/L (ref 96–112)
Creat: 1.71 mg/dL — ABNORMAL HIGH (ref 0.50–1.10)

## 2011-08-20 LAB — HEPATIC FUNCTION PANEL
AST: 20 U/L (ref 0–37)
Alkaline Phosphatase: 45 U/L (ref 39–117)
Bilirubin, Direct: 0.1 mg/dL (ref 0.0–0.3)
Total Bilirubin: 0.4 mg/dL (ref 0.3–1.2)

## 2011-08-26 ENCOUNTER — Ambulatory Visit: Payer: Medicare Other | Admitting: Internal Medicine

## 2011-08-28 ENCOUNTER — Ambulatory Visit (INDEPENDENT_AMBULATORY_CARE_PROVIDER_SITE_OTHER): Payer: Medicare Other | Admitting: Internal Medicine

## 2011-08-28 ENCOUNTER — Encounter: Payer: Self-pay | Admitting: Internal Medicine

## 2011-08-28 VITALS — BP 110/58 | HR 68 | Temp 98.2°F | Resp 20 | Ht 67.0 in | Wt 195.0 lb

## 2011-08-28 DIAGNOSIS — N182 Chronic kidney disease, stage 2 (mild): Secondary | ICD-10-CM

## 2011-08-28 DIAGNOSIS — I10 Essential (primary) hypertension: Secondary | ICD-10-CM

## 2011-08-28 DIAGNOSIS — E119 Type 2 diabetes mellitus without complications: Secondary | ICD-10-CM

## 2011-08-28 MED ORDER — LOVASTATIN 40 MG PO TABS: 40.0000 mg | ORAL_TABLET | Freq: Every day | ORAL | Status: DC

## 2011-08-28 NOTE — Assessment & Plan Note (Signed)
Increase lantus additional 2-4 units. Focus on diabetic diet, exercise and wt loss

## 2011-08-28 NOTE — Progress Notes (Signed)
  Subjective:    Patient ID: [name], female    DOB: [birth year], 78 y.o.   MRN: [id number]  HPI Pt presents to clinic for followup of multiple medical problems. Has CRI with stable creatinine. k minimally elevated. No evidence of fluid overload. a1c mildly improved. avf fsbs 130's.   Past Medical History  Diagnosis Date  . Hypertension   . Hyperlipidemia   . GERD (gastroesophageal reflux disease)   . Anemia   . CAD (coronary artery disease)     status Taxus drug-eluding stent to the LAD December 2006  . Edema     lower extremity with normal BNP  . Encounter for long-term (current) use of medications   . Hyperkalemia   . Glossitis   . Leg pain, left   . Routine general medical examination at a health care facility   . Chronic renal disease     mild  . Pyuria   . Anemia, iron deficiency   . UTI (urinary tract infection)   . Pain in foot     bilateral  . Osteopenia   . Diverticulosis of colon   . Diabetes mellitus type II   . Polyneuropathy    Past Surgical History  Procedure Date  . Cholecystectomy   . Total abdominal hysterectomy 1969    no BSO  . Ptca     stent to LAD in 03/2005, recatheterization inJanuarry 2007 for recurrent chest pain  that showed a patent LAD stent with normal LV function  . Esophagogastroduodenoscopy 04/15/2006  . Cardiovascular stress test 02/24/2006  . Electrocardiogram 06/18/2006    reports that she has never smoked. She has never used smokeless tobacco. She reports that she does not drink alcohol or use illicit drugs. family history includes Heart disease in her mother and sister. Allergies  Allergen Reactions  . Pioglitazone     REACTION: Edema  . Sulfonamide Derivatives     hives     Review of Systems see hpi     Objective:   Physical Exam  Physical Exam  Nursing note and vitals reviewed. Constitutional: Appears well-developed and well-nourished. No distress.  HENT:  Head: Normocephalic and atraumatic.  Right Ear:  External ear normal.  Left Ear: External ear normal.  Eyes: Conjunctivae are normal. No scleral icterus.  Neck: Neck supple. Carotid bruit is not present.  Cardiovascular: Normal rate, regular rhythm and normal heart sounds.  Exam reveals no gallop and no friction rub.   No murmur heard. Pulmonary/Chest: Effort normal and breath sounds normal. No respiratory distress. He has no wheezes. no rales.  Lymphadenopathy:    He has no cervical adenopathy.  Neurological:Alert.  Skin: Skin is warm and dry. Not diaphoretic.  Psychiatric: Has a normal mood and affect.        Assessment & Plan:

## 2011-08-28 NOTE — Assessment & Plan Note (Signed)
Normotensive and stable. Continue current regimen. Monitor bp as outpt and followup in clinic as scheduled.  

## 2011-08-28 NOTE — Assessment & Plan Note (Signed)
Stable. Minimal hyperkalemia. Avoid k rich foods. Repeat chem7 in 2wks

## 2011-08-28 NOTE — Patient Instructions (Signed)
Please schedule a non fasting lab test in approximately 2 weeks (chem7-hyperkalemia) Schedule fasting labs prior to next visit -chem7, a1c 250.0

## 2011-09-02 ENCOUNTER — Other Ambulatory Visit: Payer: Self-pay | Admitting: Internal Medicine

## 2011-09-02 NOTE — Telephone Encounter (Signed)
Refill sent to pharmacy.   

## 2011-09-03 ENCOUNTER — Telehealth: Payer: Self-pay | Admitting: Internal Medicine

## 2011-09-03 MED ORDER — GLUCOSE BLOOD VI STRP: ORAL_STRIP | Status: DC

## 2011-09-03 NOTE — Telephone Encounter (Signed)
Freestyle lite test strip  Pharmacy comments:  Need DX code, please.

## 2011-09-03 NOTE — Telephone Encounter (Signed)
Test strip refill re-sent per 09/02/11 authorization and added dx 250.00.

## 2011-09-04 ENCOUNTER — Telehealth: Payer: Self-pay | Admitting: Internal Medicine

## 2011-09-04 MED ORDER — LISINOPRIL 2.5 MG PO TABS: 2.5000 mg | ORAL_TABLET | Freq: Every day | ORAL | Status: DC

## 2011-09-04 NOTE — Telephone Encounter (Signed)
Refill sent; #90 x 1 refill. 

## 2011-09-04 NOTE — Telephone Encounter (Signed)
Please send lisinopril refill to [facility] on [address]

## 2011-09-11 ENCOUNTER — Other Ambulatory Visit: Payer: Self-pay | Admitting: Internal Medicine

## 2011-09-11 DIAGNOSIS — E875 Hyperkalemia: Secondary | ICD-10-CM

## 2011-09-11 DIAGNOSIS — E119 Type 2 diabetes mellitus without complications: Secondary | ICD-10-CM

## 2011-09-11 LAB — BASIC METABOLIC PANEL
CO2: 25 mEq/L (ref 19–32)
Calcium: 9.4 mg/dL (ref 8.4–10.5)
Creat: 1.41 mg/dL — ABNORMAL HIGH (ref 0.50–1.10)
Sodium: 142 mEq/L (ref 135–145)

## 2011-10-01 ENCOUNTER — Encounter: Payer: Self-pay | Admitting: *Deleted

## 2011-10-01 NOTE — Telephone Encounter (Signed)
This encounter was created in error - please disregard.

## 2011-10-21 ENCOUNTER — Telehealth: Payer: Self-pay | Admitting: Internal Medicine

## 2011-10-21 MED ORDER — INSULIN GLARGINE 100 UNIT/ML ~~LOC~~ SOLN: 20.0000 [IU] | Freq: Every day | SUBCUTANEOUS | Status: DC

## 2011-10-21 NOTE — Telephone Encounter (Signed)
Patient states that she is out of Lantus and would like refill sent to [facility] on [address]. 2 boxes.

## 2011-10-21 NOTE — Telephone Encounter (Signed)
Rx refill sent to pharmacy. 

## 2011-10-21 NOTE — Telephone Encounter (Signed)
Call received from [facility]  pharmacy regarding Lantus Rx. His message stated Rx was received for Lantus vials and patient is requesting Lantus Pens. Pharmacist is requesting a new Rx for Lantus Solostar pen.   Rx for Lantus  Solostar Pen sent to pharmacy.

## 2011-11-27 ENCOUNTER — Ambulatory Visit: Payer: Medicare Other | Admitting: Internal Medicine

## 2011-12-02 ENCOUNTER — Ambulatory Visit: Payer: Medicare Other | Admitting: Internal Medicine

## 2011-12-17 ENCOUNTER — Telehealth: Payer: Self-pay | Admitting: Internal Medicine

## 2011-12-17 LAB — BASIC METABOLIC PANEL
Calcium: 9.1 mg/dL (ref 8.4–10.5)
Potassium: 4.5 mEq/L (ref 3.5–5.3)
Sodium: 139 mEq/L (ref 135–145)

## 2011-12-17 LAB — HEMOGLOBIN A1C
Hgb A1c MFr Bld: 7.7 % — ABNORMAL HIGH (ref <5.7)
Mean Plasma Glucose: 174 mg/dL — ABNORMAL HIGH (ref <117)

## 2011-12-17 MED ORDER — CARVEDILOL 6.25 MG PO TABS: 6.2500 mg | ORAL_TABLET | Freq: Two times a day (BID) | ORAL | Status: DC

## 2011-12-17 NOTE — Telephone Encounter (Signed)
Refill- coreg 6.25mg  tab. Take one tablet by mouth twice daily with meals. Qty 180 last fill 5.21.13

## 2011-12-17 NOTE — Telephone Encounter (Signed)
Rx done/SLS 

## 2011-12-17 NOTE — Addendum Note (Signed)
Addended by: Regis Bill on: 12/17/2011 08:42 AM   Modules accepted: Orders

## 2011-12-19 ENCOUNTER — Encounter: Payer: Self-pay | Admitting: Gastroenterology

## 2011-12-23 ENCOUNTER — Telehealth: Payer: Self-pay | Admitting: Internal Medicine

## 2011-12-23 ENCOUNTER — Ambulatory Visit (INDEPENDENT_AMBULATORY_CARE_PROVIDER_SITE_OTHER): Payer: Medicare Other | Admitting: Internal Medicine

## 2011-12-23 ENCOUNTER — Encounter: Payer: Self-pay | Admitting: Internal Medicine

## 2011-12-23 VITALS — BP 126/72 | HR 58 | Temp 98.2°F | Resp 16 | Wt 191.2 lb

## 2011-12-23 DIAGNOSIS — E785 Hyperlipidemia, unspecified: Secondary | ICD-10-CM

## 2011-12-23 DIAGNOSIS — N182 Chronic kidney disease, stage 2 (mild): Secondary | ICD-10-CM

## 2011-12-23 DIAGNOSIS — I10 Essential (primary) hypertension: Secondary | ICD-10-CM

## 2011-12-23 DIAGNOSIS — E119 Type 2 diabetes mellitus without complications: Secondary | ICD-10-CM

## 2011-12-23 NOTE — Assessment & Plan Note (Signed)
Normotensive and stable. Continue current regimen. Monitor bp as outpt and followup in clinic as scheduled.  

## 2011-12-23 NOTE — Assessment & Plan Note (Signed)
Stable. Obtain lipid/lft prior to next visit. 

## 2011-12-23 NOTE — Assessment & Plan Note (Signed)
suboptimal control. lantus titration instructions provided and pt states understanding. Insulin samples provided.

## 2011-12-23 NOTE — Assessment & Plan Note (Signed)
Stable. No evidence of volume overload or electrolyte disturbance.

## 2011-12-23 NOTE — Progress Notes (Signed)
  Subjective:    Patient ID: [name], female    DOB: Aug 30, 1933, 78 y.o.   MRN: [id number]  HPI Pt presents to clinic for followup of multiple medical problems. Notes lowest fsbs recently 129 with avg 150's. Feels that stress over husband's health is contributing. Weight down 4lbs since last visit. Bilateral lower ext neuropathy is stable. No new complaints.  Past Medical History  Diagnosis Date  . Hypertension   . Hyperlipidemia   . GERD (gastroesophageal reflux disease)   . Anemia   . CAD (coronary artery disease)     status Taxus drug-eluding stent to the LAD December 2006  . Edema     lower extremity with normal BNP  . Encounter for long-term (current) use of medications   . Hyperkalemia   . Glossitis   . Leg pain, left   . Routine general medical examination at a health care facility   . Chronic renal disease     mild  . Pyuria   . Anemia, iron deficiency   . UTI (urinary tract infection)   . Pain in foot     bilateral  . Osteopenia   . Diverticulosis of colon   . Diabetes mellitus type II   . Polyneuropathy    Past Surgical History  Procedure Date  . Cholecystectomy   . Total abdominal hysterectomy 1969    no BSO  . Ptca     stent to LAD in 03/2005, recatheterization inJanuarry 2007 for recurrent chest pain  that showed a patent LAD stent with normal LV function  . Esophagogastroduodenoscopy 04/15/2006  . Cardiovascular stress test 02/24/2006  . Electrocardiogram 06/18/2006    reports that she has never smoked. She has never used smokeless tobacco. She reports that she does not drink alcohol or use illicit drugs. family history includes Heart disease in her mother and sister. Allergies  Allergen Reactions  . Pioglitazone     REACTION: Edema  . Sulfonamide Derivatives     hives      Review of Systems see hpi     Objective:   Physical Exam  Physical Exam  Nursing note and vitals reviewed. Constitutional: Appears well-developed and  well-nourished. No distress.  HENT:  Head: Normocephalic and atraumatic.  Right Ear: External ear normal.  Left Ear: External ear normal.  Eyes: Conjunctivae are normal. No scleral icterus.  Neck: Neck supple. Carotid bruit is not present.  Cardiovascular: Normal rate, regular rhythm and normal heart sounds.  Exam reveals no gallop and no friction rub.   No murmur heard. Pulmonary/Chest: Effort normal and breath sounds normal. No respiratory distress. He has no wheezes. no rales.  Lymphadenopathy:    He has no cervical adenopathy.  Neurological:Alert.  Skin: Skin is warm and dry. Not diaphoretic.  Psychiatric: Has a normal mood and affect.        Assessment & Plan:

## 2011-12-23 NOTE — Patient Instructions (Signed)
Please increase your lantus insulin dose 3 units every 3 days until your morning fasting sugar is consistently less than 130 without lows. Please schedule fasting labs prior to next visit-cbc, chem7, a1c, urine microalbumin-250.00 and lipid/lft-272.4

## 2012-01-22 ENCOUNTER — Encounter: Payer: Self-pay | Admitting: Internal Medicine

## 2012-01-22 ENCOUNTER — Ambulatory Visit (INDEPENDENT_AMBULATORY_CARE_PROVIDER_SITE_OTHER): Payer: Medicare Other | Admitting: Internal Medicine

## 2012-01-22 VITALS — BP 108/60 | HR 54 | Temp 97.5°F | Resp 16 | Wt 188.0 lb

## 2012-01-22 DIAGNOSIS — J069 Acute upper respiratory infection, unspecified: Secondary | ICD-10-CM

## 2012-01-22 MED ORDER — AMOXICILLIN 500 MG PO CAPS: 500.0000 mg | ORAL_CAPSULE | Freq: Three times a day (TID) | ORAL | Status: DC

## 2012-01-22 NOTE — Progress Notes (Signed)
  Subjective:    Patient ID: [name], female    DOB: 03/19/1934, 78 y.o.   MRN: [id number]  HPI patient presents to clinic for evaluation of cough. Notes four-day history of sinus pain and pressure sore throat and cough productive for white sputum. Denies hemoptysis fever chills or shortness of breath. He is avoiding over-the-counter cold medication. No alleviating or exacerbating factors. No other complaints.  Past Medical History  Diagnosis Date  . Hypertension   . Hyperlipidemia   . GERD (gastroesophageal reflux disease)   . Anemia   . CAD (coronary artery disease)     status Taxus drug-eluding stent to the LAD December 2006  . Edema     lower extremity with normal BNP  . Encounter for long-term (current) use of medications   . Hyperkalemia   . Glossitis   . Leg pain, left   . Routine general medical examination at a health care facility   . Chronic renal disease     mild  . Pyuria   . Anemia, iron deficiency   . UTI (urinary tract infection)   . Pain in foot     bilateral  . Osteopenia   . Diverticulosis of colon   . Diabetes mellitus type II   . Polyneuropathy    Past Surgical History  Procedure Date  . Cholecystectomy   . Total abdominal hysterectomy 1969    no BSO  . Ptca     stent to LAD in 03/2005, recatheterization inJanuarry 2007 for recurrent chest pain  that showed a patent LAD stent with normal LV function  . Esophagogastroduodenoscopy 04/15/2006  . Cardiovascular stress test 02/24/2006  . Electrocardiogram 06/18/2006    reports that she has never smoked. She has never used smokeless tobacco. She reports that she does not drink alcohol or use illicit drugs. family history includes Heart disease in her mother and sister. Allergies  Allergen Reactions  . Pioglitazone     REACTION: Edema Actos.  . Sulfonamide Derivatives     hives     Review of Systems see hpi     Objective:   Physical Exam  Nursing note and vitals  reviewed. Constitutional: She appears well-developed and well-nourished. No distress.  HENT:  Head: Normocephalic and atraumatic.  Right Ear: Tympanic membrane, external ear and ear canal normal.  Left Ear: Tympanic membrane, external ear and ear canal normal.  Nose: Nose normal.  Mouth/Throat: Oropharynx is clear and moist. No oropharyngeal exudate.  Eyes: Conjunctivae normal are normal. No scleral icterus.  Neck: Neck supple.  Pulmonary/Chest: Effort normal and breath sounds normal. No respiratory distress. She has no wheezes. She has no rales.  Skin: Skin is warm and dry. She is not diaphoretic.  Psychiatric: She has a normal mood and affect.          Assessment & Plan:

## 2012-01-22 NOTE — Assessment & Plan Note (Signed)
Currently suspect possible viral etiology. Given antibiotic to hold. Began antibiotic if symptoms do not improve after total duration of approximately 7-10 days. Followup if no improvement or worsening.

## 2012-02-17 ENCOUNTER — Telehealth: Payer: Self-pay | Admitting: Internal Medicine

## 2012-02-17 NOTE — Telephone Encounter (Signed)
Refill- fenofibrate 54mg  tab. Take one tablet by mouth every day. Qty 90

## 2012-02-18 MED ORDER — FENOFIBRATE 54 MG PO TABS: 54.0000 mg | ORAL_TABLET | Freq: Every day | ORAL | Status: DC

## 2012-02-18 NOTE — Telephone Encounter (Signed)
Rx to pharmacy/SLS 

## 2012-02-19 ENCOUNTER — Telehealth: Payer: Self-pay | Admitting: *Deleted

## 2012-02-19 NOTE — Telephone Encounter (Signed)
Pt called requesting [facility] be removed from her pharmacy list. Update completed.

## 2012-02-19 NOTE — Telephone Encounter (Signed)
Pt called requesting refill on fenobibrate. Rx e-scribed in on 02/18/12 by Burnard Leigh to [facility] on [address]. Called pt to let her know that her rx was at that pharmacy. She asked we delete this pharmacy and use only the [facility] on [address] as her primary pharmacy.

## 2012-03-04 ENCOUNTER — Other Ambulatory Visit: Payer: Self-pay | Admitting: *Deleted

## 2012-03-04 MED ORDER — GLUCOSE BLOOD VI STRP: ORAL_STRIP | Status: DC

## 2012-03-04 MED ORDER — LISINOPRIL 2.5 MG PO TABS: 2.5000 mg | ORAL_TABLET | Freq: Every day | ORAL | Status: DC

## 2012-03-04 NOTE — Telephone Encounter (Signed)
Rx[s] to pharmacy per pt request/SLS

## 2012-03-10 ENCOUNTER — Telehealth: Payer: Self-pay | Admitting: Internal Medicine

## 2012-03-10 MED ORDER — LOVASTATIN 40 MG PO TABS: 40.0000 mg | ORAL_TABLET | Freq: Every day | ORAL | Status: DC

## 2012-03-10 NOTE — Telephone Encounter (Signed)
90-day supply to pharmacy/SLS

## 2012-03-10 NOTE — Telephone Encounter (Signed)
Refill lovastatin 40 mg tab take 1 tablet by mouth at bedtime qty 90 last fill 12-04-2011

## 2012-03-11 ENCOUNTER — Emergency Department (HOSPITAL_COMMUNITY): Payer: Medicare Other

## 2012-03-11 ENCOUNTER — Emergency Department (HOSPITAL_COMMUNITY)
Admission: EM | Admit: 2012-03-11 | Discharge: 2012-03-11 | Disposition: A | Discharge status: Home / Self Care | Payer: Medicare Other | Attending: Emergency Medicine | Admitting: Emergency Medicine

## 2012-03-11 ENCOUNTER — Telehealth: Payer: Self-pay | Admitting: Internal Medicine

## 2012-03-11 ENCOUNTER — Encounter (HOSPITAL_COMMUNITY): Payer: Self-pay | Admitting: Emergency Medicine

## 2012-03-11 DIAGNOSIS — Z794 Long term (current) use of insulin: Secondary | ICD-10-CM | POA: Insufficient documentation

## 2012-03-11 DIAGNOSIS — R739 Hyperglycemia, unspecified: Secondary | ICD-10-CM

## 2012-03-11 DIAGNOSIS — R7309 Other abnormal glucose: Secondary | ICD-10-CM | POA: Insufficient documentation

## 2012-03-11 DIAGNOSIS — Z79899 Other long term (current) drug therapy: Secondary | ICD-10-CM | POA: Insufficient documentation

## 2012-03-11 DIAGNOSIS — E785 Hyperlipidemia, unspecified: Secondary | ICD-10-CM | POA: Insufficient documentation

## 2012-03-11 DIAGNOSIS — N390 Urinary tract infection, site not specified: Secondary | ICD-10-CM | POA: Insufficient documentation

## 2012-03-11 DIAGNOSIS — Z8719 Personal history of other diseases of the digestive system: Secondary | ICD-10-CM | POA: Insufficient documentation

## 2012-03-11 DIAGNOSIS — E860 Dehydration: Secondary | ICD-10-CM

## 2012-03-11 DIAGNOSIS — I2510 Atherosclerotic heart disease of native coronary artery without angina pectoris: Secondary | ICD-10-CM | POA: Insufficient documentation

## 2012-03-11 DIAGNOSIS — I10 Essential (primary) hypertension: Secondary | ICD-10-CM | POA: Insufficient documentation

## 2012-03-11 DIAGNOSIS — N189 Chronic kidney disease, unspecified: Secondary | ICD-10-CM | POA: Insufficient documentation

## 2012-03-11 DIAGNOSIS — R51 Headache: Secondary | ICD-10-CM

## 2012-03-11 DIAGNOSIS — Z862 Personal history of diseases of the blood and blood-forming organs and certain disorders involving the immune mechanism: Secondary | ICD-10-CM | POA: Insufficient documentation

## 2012-03-11 DIAGNOSIS — K219 Gastro-esophageal reflux disease without esophagitis: Secondary | ICD-10-CM | POA: Insufficient documentation

## 2012-03-11 DIAGNOSIS — Z8639 Personal history of other endocrine, nutritional and metabolic disease: Secondary | ICD-10-CM | POA: Insufficient documentation

## 2012-03-11 LAB — BASIC METABOLIC PANEL
CO2: 22 mEq/L (ref 19–32)
Chloride: 109 mEq/L (ref 96–112)
GFR calc Af Amer: 38 mL/min — ABNORMAL LOW (ref >90)
Potassium: 4.4 mEq/L (ref 3.5–5.1)

## 2012-03-11 LAB — CBC WITH DIFFERENTIAL/PLATELET
Basophils Absolute: 0.1 10*3/uL (ref 0.0–0.1)
Basophils Relative: 1 % (ref 0–1)
Hemoglobin: 12.5 g/dL (ref 12.0–15.0)
Lymphocytes Relative: 30 % (ref 12–46)
MCHC: 33.6 g/dL (ref 30.0–36.0)
Neutro Abs: 3.7 10*3/uL (ref 1.7–7.7)
Neutrophils Relative %: 56 % (ref 43–77)
RDW: 12.7 % (ref 11.5–15.5)
WBC: 6.7 10*3/uL (ref 4.0–10.5)

## 2012-03-11 MED ORDER — METOCLOPRAMIDE HCL 5 MG/ML IJ SOLN
10.0000 mg | Freq: Once | INTRAMUSCULAR | Status: AC
  Administered 2012-03-11: 10 mg via INTRAVENOUS
  Filled 2012-03-11: qty 2

## 2012-03-11 MED ORDER — DIPHENHYDRAMINE HCL 50 MG/ML IJ SOLN
25.0000 mg | Freq: Once | INTRAMUSCULAR | Status: AC
  Administered 2012-03-11: 25 mg via INTRAVENOUS
  Filled 2012-03-11: qty 1

## 2012-03-11 MED ORDER — SODIUM CHLORIDE 0.9 % IV BOLUS (SEPSIS)
500.0000 mL | Freq: Once | INTRAVENOUS | Status: AC
  Administered 2012-03-11: 500 mL via INTRAVENOUS
  Administered 2012-03-11: 09:00:00 via INTRAVENOUS

## 2012-03-11 MED ORDER — OXYCODONE-ACETAMINOPHEN 5-325 MG PO TABS
1.0000 | ORAL_TABLET | Freq: Once | ORAL | Status: AC
  Administered 2012-03-11: 1 via ORAL
  Filled 2012-03-11: qty 1

## 2012-03-11 NOTE — ED Provider Notes (Signed)
History     CSN: [id number]  Arrival date & time 03/11/12  0707   First MD Initiated Contact with Patient 03/11/12 [phone]      Chief Complaint  Patient presents with  . Headache     Patient is a 78 y.o. female presenting with headaches. The history is provided by the patient.  Headache  This is a new problem. The current episode started 3 to 5 hours ago. The problem occurs constantly. The problem has been gradually worsening. The headache is associated with nothing. The pain is located in the frontal region. The quality of the pain is described as dull. The pain is moderate. Radiates to: back of head. Associated symptoms include nausea. Pertinent negatives include no fever, no chest pressure, no palpitations, no syncope, no shortness of breath and no vomiting. She has tried nothing for the symptoms.  pt reports she had headache that started in the middle of the night while sleeping After waking up it took about an hour for it to worsen No fever/vomiting No visual changes No cp/sob.  No focal weakness.  No falls.  No ataxia.  No slurred speech.  No confusion No neck stiffness No recent illness/fevers She usually does not get headaches Denies h/o CVA No one else at home has these symptoms No eye pain or visual changes No thunderclap/pop reported Not sudden in onset No hearing changes Past Medical History  Diagnosis Date  . Hypertension   . Hyperlipidemia   . GERD (gastroesophageal reflux disease)   . Anemia   . CAD (coronary artery disease)     status Taxus drug-eluding stent to the LAD December 2006  . Edema     lower extremity with normal BNP  . Encounter for long-term (current) use of medications   . Hyperkalemia   . Glossitis   . Leg pain, left   . Routine general medical examination at a health care facility   . Chronic renal disease     mild  . Pyuria   . Anemia, iron deficiency   . UTI (urinary tract infection)   . Pain in foot     bilateral  . Osteopenia   .  Diverticulosis of colon   . Diabetes mellitus type II   . Polyneuropathy     Past Surgical History  Procedure Date  . Cholecystectomy   . Total abdominal hysterectomy 1969    no BSO  . Ptca     stent to LAD in 03/2005, recatheterization inJanuarry 2007 for recurrent chest pain  that showed a patent LAD stent with normal LV function  . Esophagogastroduodenoscopy 04/15/2006  . Cardiovascular stress test 02/24/2006  . Electrocardiogram 06/18/2006    Family History  Problem Relation Age of Onset  . Heart disease Mother   . Heart disease Sister     History  Substance Use Topics  . Smoking status: Never Smoker   . Smokeless tobacco: Never Used  . Alcohol Use: No    OB History    Grav Para Term Preterm Abortions TAB SAB Ect Mult Living                  Review of Systems  Constitutional: Negative for fever.  Respiratory: Negative for shortness of breath.   Cardiovascular: Negative for palpitations and syncope.  Gastrointestinal: Positive for nausea. Negative for vomiting.  Neurological: Positive for headaches.  All other systems reviewed and are negative.    Allergies  Pioglitazone and Sulfonamide derivatives  Home Medications  Current Outpatient Rx  Name  Route  Sig  Dispense  Refill  . AMOXICILLIN 500 MG PO CAPS   Oral   Take 1 capsule (500 mg total) by mouth 3 (three) times daily.   21 capsule   0   . CALCIUM CARBONATE ANTACID 500 MG PO CHEW   Oral   Chew 1 tablet by mouth 3 (three) times daily as needed. For acid reflux          . CARVEDILOL 6.25 MG PO TABS   Oral   Take 1 tablet (6.25 mg total) by mouth 2 (two) times daily with a meal.   180 tablet   1   . FENOFIBRATE 54 MG PO TABS   Oral   Take 1 tablet (54 mg total) by mouth daily.   90 tablet   2   . GLUCOSE BLOOD VI STRP      Use as instructed to test blood glucose. Dx code 250.00   200 each   5   . INSULIN ASPART 100 UNIT/ML [place] SOLN   Subcutaneous   Inject into the skin. Inject  five units in the am and 10 units at lunch and dinner         . INSULIN GLARGINE 100 UNIT/ML [place] SOLN   Subcutaneous   Inject 20 Units into the skin at bedtime.   10 pen   3   . INSULIN PEN NEEDLE 31G X 8 MM MISC   Does not apply   1 each by Does not apply route 2 (two) times daily before a meal.   100 each   1   . LISINOPRIL 2.5 MG PO TABS   Oral   Take 1 tablet (2.5 mg total) by mouth daily.   90 tablet   1   . LOVASTATIN 40 MG PO TABS   Oral   Take 1 tablet (40 mg total) by mouth at bedtime.   90 tablet   1     Quantity update   . ONE-DAILY MULTI VITAMINS PO TABS   Oral   Take 1 tablet by mouth daily.           Marland Kitchen NITROGLYCERIN 0.4 MG SL SUBL   Sublingual   Place 0.4 mg under the tongue every 5 (five) minutes as needed. For chest pain           BP 180/86  Pulse 66  Temp 98.1 F (36.7 C) (Oral)  Resp 18  SpO2 99% BP 147/78  Pulse 61  Temp 98.1 F (36.7 C) (Oral)  Resp 18  SpO2 99%  Physical Exam CONSTITUTIONAL: Well developed/well nourished HEAD AND FACE: Normocephalic/atraumatic EYES: EOMI/PERRL, no nystagmus ENMT: Mucous membranes moist NECK: supple no meningeal signs, no bruits SPINE:entire spine nontender CV: S1/S2 noted, no murmurs/rubs/gallops noted LUNGS: Lungs are clear to auscultation bilaterally, no apparent distress ABDOMEN: soft, nontender, no rebound or guarding GU:no cva tenderness NEURO:Awake/alert, facies symmetric, no arm or leg drift is noted Cranial nerves 3/4/5/6/11/04/08/11/12 tested and intact Gait normal, no ataxia No past pointing EXTREMITIES: pulses normal, full ROM SKIN: warm, color normal PSYCH: no abnormalities of mood noted   ED Course  Procedures    Labs Reviewed  CBC WITH DIFFERENTIAL  BASIC METABOLIC PANEL   1:61 AM Pt with HA that woke her up but is well appearing, nontoxic She is well appearing, no signs of CVA.  Doubt SAH.  Given her age, will get Ct head 9:31 AM Pt improved, no distress.  Story  not c/w SAH.  Doubt temporal arteritis.  She is improved, no distress, vitals improved Discussed strict return precautions, pt agreeable  MDM  Nursing notes including past medical history and social history reviewed and considered in documentation Labs/vital reviewed and considered        Date: 03/11/2012  Rate: 66  Rhythm: normal sinus rhythm  QRS Axis: normal  Intervals: normal  ST/T Wave abnormalities: nonspecific ST changes  Conduction Disutrbances:none  Narrative Interpretation:   Old EKG Reviewed: unchanged    Joya Gaskins, MD 03/11/12 [phone]

## 2012-03-11 NOTE — Telephone Encounter (Signed)
Spoke w/pt's daughter, still having nausea & vomiting, no relief; scheduled 30-min ED f/u 11.14.13 at 8:30am/SLS

## 2012-03-11 NOTE — Telephone Encounter (Signed)
Patient went to the ED this morning and the physician there told her to follow up with her PCP on Friday. Does patient need to come in on Friday or can she wait the usual time? Thanks.

## 2012-03-11 NOTE — ED Notes (Signed)
Pt. States that around 0330 she stated having a headache that goes from front of her head to the back of her neck.

## 2012-03-12 ENCOUNTER — Encounter: Payer: Self-pay | Admitting: Internal Medicine

## 2012-03-12 ENCOUNTER — Ambulatory Visit: Payer: Medicare Other | Admitting: Internal Medicine

## 2012-03-12 ENCOUNTER — Ambulatory Visit (INDEPENDENT_AMBULATORY_CARE_PROVIDER_SITE_OTHER): Payer: Medicare Other | Admitting: Internal Medicine

## 2012-03-12 VITALS — BP 112/66 | HR 80 | Temp 98.0°F | Resp 18 | Wt 186.0 lb

## 2012-03-12 DIAGNOSIS — R51 Headache: Secondary | ICD-10-CM

## 2012-03-12 MED ORDER — ONDANSETRON HCL 4 MG PO TABS: 4.0000 mg | ORAL_TABLET | Freq: Three times a day (TID) | ORAL | Status: DC | PRN

## 2012-03-14 DIAGNOSIS — R519 Headache, unspecified: Secondary | ICD-10-CM | POA: Insufficient documentation

## 2012-03-14 DIAGNOSIS — R51 Headache: Secondary | ICD-10-CM | POA: Insufficient documentation

## 2012-03-14 NOTE — Progress Notes (Signed)
  Subjective:    Patient ID: [name], female    DOB: 04-07-1934, 78 y.o.   MRN: [id number]  HPI patient presents to clinic for evaluation of headache. Notes 2 day history of left-sided headache with nausea and vomiting. Presented to the emergency department underwent negative head CT for acute findings. Was given analgesia which she states may have precipitated vomiting. No abdominal pain fever chills or neurologic complaint or deficit. No symptoms much better today in terms of headache pain and nausea. Is attempting to drink fluid. Pushers have been more elevated since the symptoms. Home fingerstick blood sugar in the mid to upper 100s. No hypoglycemia.  Past Medical History  Diagnosis Date  . Hypertension   . Hyperlipidemia   . GERD (gastroesophageal reflux disease)   . Anemia   . CAD (coronary artery disease)     status Taxus drug-eluding stent to the LAD December 2006  . Edema     lower extremity with normal BNP  . Encounter for long-term (current) use of medications   . Hyperkalemia   . Glossitis   . Leg pain, left   . Routine general medical examination at a health care facility   . Chronic renal disease     mild  . Pyuria   . Anemia, iron deficiency   . UTI (urinary tract infection)   . Pain in foot     bilateral  . Osteopenia   . Diverticulosis of colon   . Diabetes mellitus type II   . Polyneuropathy    Past Surgical History  Procedure Date  . Cholecystectomy   . Total abdominal hysterectomy 1969    no BSO  . Ptca     stent to LAD in 03/2005, recatheterization inJanuarry 2007 for recurrent chest pain  that showed a patent LAD stent with normal LV function  . Esophagogastroduodenoscopy 04/15/2006  . Cardiovascular stress test 02/24/2006  . Electrocardiogram 06/18/2006    reports that she has never smoked. She has never used smokeless tobacco. She reports that she does not drink alcohol or use illicit drugs. family history includes Heart disease in her  mother and sister. Allergies  Allergen Reactions  . Pioglitazone     REACTION: Edema Actos.  . Sulfonamide Derivatives     hives     Review of Systems see hpi     Objective:   Physical Exam  Nursing note and vitals reviewed. Constitutional: She appears well-developed and well-nourished. No distress.  HENT:  Head: Normocephalic and atraumatic.  Eyes: Conjunctivae normal and EOM are normal. Pupils are equal, round, and reactive to light.  Abdominal: Soft. Bowel sounds are normal. She exhibits no distension and no mass. There is no tenderness. There is no rebound and no guarding.  Neurological: She is alert. No cranial nerve deficit. Coordination normal.  Skin: Skin is warm and dry. She is not diaphoretic.  Psychiatric: She has a normal mood and affect.          Assessment & Plan:

## 2012-03-14 NOTE — Assessment & Plan Note (Signed)
Neurologically nonfocal. Improving. Suspect possible viral etiology. Increase fluid intake. Zofran as needed for nausea. Schedule close follow up. Has already received influenza vaccine for the season.

## 2012-03-16 ENCOUNTER — Telehealth: Payer: Self-pay | Admitting: Internal Medicine

## 2012-03-16 NOTE — Telephone Encounter (Signed)
Pt reports that her H/As are 95% better; her CBGs were 120 today [had been 160/170 over past few days], she is having trouble "focusing eyes & sensitivity to light indoors & out". Patient had appointment with eye doctor last Thursday, but canceled d/t health issues at that time/SLS  Per Vo [facility], have pt reschedule appointment with Opthamologist; patient informed, understood & agreed/SLS

## 2012-03-16 NOTE — Telephone Encounter (Signed)
Patient having visual changes,   See last week  Vision is worse  Please call

## 2012-03-19 ENCOUNTER — Ambulatory Visit: Payer: Medicare Other | Admitting: Internal Medicine

## 2012-03-23 ENCOUNTER — Ambulatory Visit (INDEPENDENT_AMBULATORY_CARE_PROVIDER_SITE_OTHER): Payer: Medicare Other | Admitting: Cardiovascular Disease

## 2012-03-23 ENCOUNTER — Encounter: Payer: Self-pay | Admitting: Cardiovascular Disease

## 2012-03-23 VITALS — BP 113/68 | HR 60 | Ht 67.0 in | Wt 186.0 lb

## 2012-03-23 DIAGNOSIS — I2510 Atherosclerotic heart disease of native coronary artery without angina pectoris: Secondary | ICD-10-CM

## 2012-03-23 MED ORDER — NITROGLYCERIN 0.4 MG SL SUBL: 0.4000 mg | SUBLINGUAL_TABLET | SUBLINGUAL | Status: DC | PRN

## 2012-03-23 NOTE — Progress Notes (Signed)
History of Present Illness: 78 yo WF with a history of CAD status post previous Taxus LAD stent Dec 2006, HTN, HLD, and diabetes. She also has severe neuropathy. She has been followed in the past by Dr. Gala Romney.   She is here today for follow up. She has been doing well. No chest pain, SOB, palpitations, near syncope or syncope. She is having right cataract surgery in the am.   Primary Care Physician: Dr. Rodena Medin  Last Lipid Profile:Lipid Panel     Component Value Date/Time   CHOL 161 08/19/2011 0847   TRIG 132 08/19/2011 0847   HDL 50 08/19/2011 0847   CHOLHDL 3.2 08/19/2011 0847   VLDL 26 08/19/2011 0847   LDLCALC 85 08/19/2011 0847     Past Medical History  Diagnosis Date  . Hypertension   . Hyperlipidemia   . GERD (gastroesophageal reflux disease)   . Anemia   . CAD (coronary artery disease)     status Taxus drug-eluding stent to the LAD December 2006  . Edema     lower extremity with normal BNP  . Encounter for long-term (current) use of medications   . Hyperkalemia   . Glossitis   . Leg pain, left   . Routine general medical examination at a health care facility   . Chronic renal disease     mild  . Pyuria   . Anemia, iron deficiency   . UTI (urinary tract infection)   . Pain in foot     bilateral  . Osteopenia   . Diverticulosis of colon   . Diabetes mellitus type II   . Polyneuropathy     Past Surgical History  Procedure Date  . Cholecystectomy   . Total abdominal hysterectomy 1969    no BSO  . Ptca     stent to LAD in 03/2005, recatheterization inJanuarry 2007 for recurrent chest pain  that showed a patent LAD stent with normal LV function  . Esophagogastroduodenoscopy 04/15/2006  . Cardiovascular stress test 02/24/2006  . Electrocardiogram 06/18/2006    Current Outpatient Prescriptions  Medication Sig Dispense Refill  . aspirin EC 81 MG tablet Take 81 mg by mouth daily.      Marland Kitchen BESIVANCE 0.6 % SUSP AS DIRECTED      . calcium carbonate (TUMS - DOSED  IN MG ELEMENTAL CALCIUM) 500 MG chewable tablet Chew 1 tablet by mouth 3 (three) times daily as needed. For acid reflux       . carvedilol (COREG) 6.25 MG tablet Take 1 tablet (6.25 mg total) by mouth 2 (two) times daily with a meal.  180 tablet  1  . DUREZOL 0.05 % EMUL AS DIRECTED      . fenofibrate 54 MG tablet Take 1 tablet (54 mg total) by mouth daily.  90 tablet  2  . glucose blood (FREESTYLE LITE) test strip Use as instructed to test blood glucose. Dx code 250.00  200 each  5  . insulin aspart (NOVOLOG) 100 UNIT/ML injection Inject 5-10 Units into the skin 3 (three) times daily before meals. Inject 10  units in the am and 10 units at lunch and dinner      . insulin glargine (LANTUS SOLOSTAR) 100 UNIT/ML injection Inject 20 Units into the skin at bedtime.  10 pen  3  . Insulin Pen Needle 31G X 8 MM MISC 1 each by Does not apply route 2 (two) times daily before a meal.  100 each  1  . lisinopril (PRINIVIL,ZESTRIL) 2.5  MG tablet Take 1 tablet (2.5 mg total) by mouth daily.  90 tablet  1  . lovastatin (MEVACOR) 40 MG tablet Take 1 tablet (40 mg total) by mouth at bedtime.  90 tablet  1  . Multiple Vitamin (MULTIVITAMIN) tablet Take 1 tablet by mouth daily.        . nitroGLYCERIN (NITROSTAT) 0.4 MG SL tablet Place 0.4 mg under the tongue every 5 (five) minutes as needed. For chest pain      . ondansetron (ZOFRAN) 4 MG tablet Take 1 tablet (4 mg total) by mouth every 8 (eight) hours as needed for nausea.  20 tablet  0    Allergies  Allergen Reactions  . Pioglitazone     REACTION: Edema Actos.  . Sulfonamide Derivatives     hives    History   Social History  . Marital Status: Married    Spouse Name: N/A    Number of Children: N/A  . Years of Education: N/A   Occupational History  . Not on file.   Social History Main Topics  . Smoking status: Never Smoker   . Smokeless tobacco: Never Used  . Alcohol Use: No  . Drug Use: No  . Sexually Active: Not on file   Other Topics  Concern  . 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 Heart disease Mother   . Heart disease Sister     Review of Systems:  As stated in the HPI and otherwise negative.   BP 113/68  Pulse 60  Ht 5\' 7"  (1.702 m)  Wt 186 lb (84.369 kg)  BMI 29.13 kg/m2  Physical Examination: General: Well developed, well nourished, NAD HEENT: OP clear, mucus membranes moist SKIN: warm, dry. No rashes. Neuro: No focal deficits Musculoskeletal: Muscle strength 5/5 all ext Psychiatric: Mood and affect normal Neck: No JVD, no carotid bruits, no thyromegaly, no lymphadenopathy. Lungs:Clear bilaterally, no wheezes, rhonci, crackles Cardiovascular: Regular rate and rhythm. No murmurs, gallops or rubs. Abdomen:Soft. Bowel sounds present. Non-tender.  Extremities: No lower extremity edema. Pulses are 2 + in the bilateral DP/PT.  Assessment and Plan:   1. CAD: Stable. She is on good medical therapy. BP is well controlled. Lipids are controlled. Refill NTG SL.   2. HTN: BP well controlled.

## 2012-03-23 NOTE — Patient Instructions (Addendum)
Your physician wants you to follow-up in:  12 months.  You will receive a reminder letter in the mail two months in advance. If you don't receive a letter, please call our office to schedule the follow-up appointment.   

## 2012-03-30 ENCOUNTER — Ambulatory Visit: Payer: Medicare Other | Admitting: Internal Medicine

## 2012-05-07 ENCOUNTER — Telehealth: Payer: Self-pay | Admitting: Internal Medicine

## 2012-05-07 MED ORDER — CIPROFLOXACIN HCL 250 MG PO TABS: 250.0000 mg | ORAL_TABLET | Freq: Two times a day (BID) | ORAL | Status: DC

## 2012-05-07 NOTE — Telephone Encounter (Signed)
Pt reports urinary frequency & burning with urination; Rx to pharmacy, will call back if no improvement or symptoms worsen for OV/SLS

## 2012-05-07 NOTE — Telephone Encounter (Signed)
If cannot come to appt then cipro 250mg  po bid x 5d. Followup if no improvement or worsening.

## 2012-05-07 NOTE — Telephone Encounter (Signed)
Patient states that she has a UTI and would like to know if Dr. Rodena Medin would call her in something w/o being seen? [facility] [address]

## 2012-05-18 ENCOUNTER — Telehealth: Payer: Self-pay | Admitting: Internal Medicine

## 2012-05-18 NOTE — Telephone Encounter (Signed)
DENIED-last Rx to [facility] [address]-10.22.13 #[id number]/SLS

## 2012-05-18 NOTE — Telephone Encounter (Signed)
Refill- fenofibrate 54mg  tab. Take one tablet by mouth every day. Qty 90 last fill 10.24.12

## 2012-05-25 ENCOUNTER — Telehealth: Payer: Self-pay

## 2012-05-25 DIAGNOSIS — E119 Type 2 diabetes mellitus without complications: Secondary | ICD-10-CM

## 2012-05-25 DIAGNOSIS — E785 Hyperlipidemia, unspecified: Secondary | ICD-10-CM

## 2012-05-25 LAB — CBC
HCT: 37.6 % (ref 36.0–46.0)
Hemoglobin: 12.7 g/dL (ref 12.0–15.0)
MCV: 89.1 fL (ref 78.0–100.0)
RBC: 4.22 MIL/uL (ref 3.87–5.11)
RDW: 13.5 % (ref 11.5–15.5)
WBC: 6.3 10*3/uL (ref 4.0–10.5)

## 2012-05-25 LAB — HEPATIC FUNCTION PANEL
Albumin: 4 g/dL (ref 3.5–5.2)
Alkaline Phosphatase: 46 U/L (ref 39–117)
Bilirubin, Direct: 0.1 mg/dL (ref 0.0–0.3)
Total Bilirubin: 0.4 mg/dL (ref 0.3–1.2)

## 2012-05-25 LAB — BASIC METABOLIC PANEL
BUN: 29 mg/dL — ABNORMAL HIGH (ref 6–23)
CO2: 25 mEq/L (ref 19–32)
Chloride: 108 mEq/L (ref 96–112)
Glucose, Bld: 158 mg/dL — ABNORMAL HIGH (ref 70–99)
Potassium: 4.8 mEq/L (ref 3.5–5.3)

## 2012-05-25 LAB — LIPID PANEL
HDL: 48 mg/dL (ref >39)
Total CHOL/HDL Ratio: 3.1 Ratio
VLDL: 22 mg/dL (ref 0–40)

## 2012-05-25 LAB — HEMOGLOBIN A1C: Mean Plasma Glucose: 171 mg/dL — ABNORMAL HIGH (ref <117)

## 2012-05-25 NOTE — Addendum Note (Signed)
Addended by: Court Joy on: 05/25/2012 05:12 PM   Modules accepted: Orders

## 2012-05-25 NOTE — Telephone Encounter (Signed)
Labs ordered for the week of 03-16-13

## 2012-05-26 LAB — MICROALBUMIN / CREATININE URINE RATIO: Microalb Creat Ratio: 14 mg/g (ref 0.0–30.0)

## 2012-06-01 ENCOUNTER — Ambulatory Visit: Payer: Medicare Other | Admitting: Family Medicine

## 2012-06-04 ENCOUNTER — Other Ambulatory Visit: Payer: Self-pay | Admitting: Family Medicine

## 2012-06-04 ENCOUNTER — Encounter: Payer: Self-pay | Admitting: Family Medicine

## 2012-06-04 ENCOUNTER — Telehealth: Payer: Self-pay | Admitting: Family Medicine

## 2012-06-04 ENCOUNTER — Ambulatory Visit (INDEPENDENT_AMBULATORY_CARE_PROVIDER_SITE_OTHER): Payer: Medicare Other | Admitting: Family Medicine

## 2012-06-04 VITALS — BP 126/62 | HR 72 | Temp 97.9°F | Ht 67.0 in | Wt 187.0 lb

## 2012-06-04 DIAGNOSIS — K219 Gastro-esophageal reflux disease without esophagitis: Secondary | ICD-10-CM

## 2012-06-04 DIAGNOSIS — E785 Hyperlipidemia, unspecified: Secondary | ICD-10-CM

## 2012-06-04 DIAGNOSIS — N182 Chronic kidney disease, stage 2 (mild): Secondary | ICD-10-CM

## 2012-06-04 DIAGNOSIS — E119 Type 2 diabetes mellitus without complications: Secondary | ICD-10-CM

## 2012-06-04 DIAGNOSIS — I10 Essential (primary) hypertension: Secondary | ICD-10-CM

## 2012-06-04 DIAGNOSIS — Z23 Encounter for immunization: Secondary | ICD-10-CM

## 2012-06-04 NOTE — Telephone Encounter (Signed)
Labs ordered.

## 2012-06-04 NOTE — Patient Instructions (Addendum)
Labs prior to next visit, hgba1c, liver, renal, cbc, tsh  Hypertension As your heart beats, it forces blood through your arteries. This force is your blood pressure. If the pressure is too high, it is called hypertension (HTN) or high blood pressure. HTN is dangerous because you may have it and not know it. High blood pressure may mean that your heart has to work harder to pump blood. Your arteries may be narrow or stiff. The extra work puts you at risk for heart disease, stroke, and other problems.  Blood pressure consists of two numbers, a higher number over a lower, 110/72, for example. It is stated as "110 over 72." The ideal is below 120 for the top number (systolic) and under 80 for the bottom (diastolic). Write down your blood pressure today. You should pay close attention to your blood pressure if you have certain conditions such as:  Heart failure.  Prior heart attack.  Diabetes  Chronic kidney disease.  Prior stroke.  Multiple risk factors for heart disease. To see if you have HTN, your blood pressure should be measured while you are seated with your arm held at the level of the heart. It should be measured at least twice. A one-time elevated blood pressure reading (especially in the Emergency Department) does not mean that you need treatment. There may be conditions in which the blood pressure is different between your right and left arms. It is important to see your caregiver soon for a recheck. Most people have essential hypertension which means that there is not a specific cause. This type of high blood pressure may be lowered by changing lifestyle factors such as:  Stress.  Smoking.  Lack of exercise.  Excessive weight.  Drug/tobacco/alcohol use.  Eating less salt. Most people do not have symptoms from high blood pressure until it has caused damage to the body. Effective treatment can often prevent, delay or reduce that damage. TREATMENT  When a cause has been  identified, treatment for high blood pressure is directed at the cause. There are a large number of medications to treat HTN. These fall into several categories, and your caregiver will help you select the medicines that are best for you. Medications may have side effects. You should review side effects with your caregiver. If your blood pressure stays high after you have made lifestyle changes or started on medicines,   Your medication(s) may need to be changed.  Other problems may need to be addressed.  Be certain you understand your prescriptions, and know how and when to take your medicine.  Be sure to follow up with your caregiver within the time frame advised (usually within two weeks) to have your blood pressure rechecked and to review your medications.  If you are taking more than one medicine to lower your blood pressure, make sure you know how and at what times they should be taken. Taking two medicines at the same time can result in blood pressure that is too low. SEEK IMMEDIATE MEDICAL CARE IF:  You develop a severe headache, blurred or changing vision, or confusion.  You have unusual weakness or numbness, or a faint feeling.  You have severe chest or abdominal pain, vomiting, or breathing problems. MAKE SURE YOU:   Understand these instructions.  Will watch your condition.  Will get help right away if you are not doing well or get worse. Document Released: 04/15/2005 Document Revised: 07/08/2011 Document Reviewed: 12/04/2007 [facility] Patient Information 2013 [place], [place].

## 2012-06-04 NOTE — Telephone Encounter (Signed)
Patient has upcoming appt in June/2014. She will be going to [facility] one week prior for labs  Hgba1c, liver, renal, cbc, tsh

## 2012-06-06 ENCOUNTER — Encounter: Payer: Self-pay | Admitting: Family Medicine

## 2012-06-06 NOTE — Assessment & Plan Note (Signed)
Tolerating mevacor, avoid trans fats, consider krill oil caps

## 2012-06-06 NOTE — Assessment & Plan Note (Signed)
Stable, no changes  

## 2012-06-06 NOTE — Assessment & Plan Note (Addendum)
Patient reports fasting sugars from 90 to 120, denies poyluria or polydipsia. Continue current dose of insulin

## 2012-06-06 NOTE — Assessment & Plan Note (Signed)
Well controlled no change in meds today

## 2012-06-06 NOTE — Assessment & Plan Note (Signed)
No c/o today, without meds

## 2012-06-06 NOTE — Progress Notes (Signed)
Patient ID: [name], female   DOB: Aug 28, 1933, 79 y.o.   MRN: [id number] [name] [id number] 02-01-1934 06/06/2012      Progress Note-Follow Up  Subjective  Chief Complaint  Chief Complaint  Patient presents with  . Follow-up    HPI  Patient is a 79 year old female in today for followup feeling well. No recent illness, fevers, chills, headache, chest pain, palpitations, shortness of breath, GI or GU complaints. He falls with cardiology for his heart disease and is been no recent change in medications. His blood sugars have been ranging from 90-120  Past Medical History  Diagnosis Date  . Hypertension   . Hyperlipidemia   . GERD (gastroesophageal reflux disease)   . Anemia   . CAD (coronary artery disease)     status Taxus drug-eluding stent to the LAD December 2006  . Edema     lower extremity with normal BNP  . Encounter for long-term (current) use of medications   . Hyperkalemia   . Glossitis   . Leg pain, left   . Routine general medical examination at a health care facility   . Chronic renal disease     mild  . Pyuria   . Anemia, iron deficiency   . UTI (urinary tract infection)   . Pain in foot     bilateral  . Osteopenia   . Diverticulosis of colon   . Diabetes mellitus type II   . Polyneuropathy     Past Surgical History  Procedure Laterality Date  . Cholecystectomy    . Total abdominal hysterectomy  1969    no BSO  . Ptca      stent to LAD in 03/2005, recatheterization inJanuarry 2007 for recurrent chest pain  that showed a patent LAD stent with normal LV function  . Esophagogastroduodenoscopy  04/15/2006  . Cardiovascular stress test  02/24/2006  . Electrocardiogram  06/18/2006    Family History  Problem Relation Age of Onset  . Heart disease Mother   . Heart disease Sister     History   Social History  . Marital Status: Married    Spouse Name: N/A    Number of Children: N/A  . Years of Education: N/A   Occupational  History  . Not on file.   Social History Main Topics  . Smoking status: Never Smoker   . Smokeless tobacco: Never Used  . Alcohol Use: No  . Drug Use: No  . Sexually Active: Not on file   Other Topics Concern  . 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 aspirin EC 81 MG tablet Take 81 mg by mouth daily.      Marland Kitchen BESIVANCE 0.6 % SUSP AS DIRECTED      . calcium carbonate (TUMS - DOSED IN MG ELEMENTAL CALCIUM) 500 MG chewable tablet Chew 1 tablet by mouth 3 (three) times daily as needed. For acid reflux       . carvedilol (COREG) 6.25 MG tablet Take 1 tablet (6.25 mg total) by mouth 2 (two) times daily with a meal.  180 tablet  1  . ciprofloxacin (CIPRO) 250 MG tablet Take 1 tablet (250 mg total)  by mouth 2 (two) times daily.  10 tablet  0  . DUREZOL 0.05 % EMUL AS DIRECTED      . fenofibrate 54 MG tablet Take 1 tablet (54 mg total) by mouth daily.  90 tablet  2  . glucose blood (FREESTYLE LITE) test strip Use as instructed to test blood glucose. Dx code 250.00  200 each  5  . insulin aspart (NOVOLOG) 100 UNIT/ML injection Inject 5-10 Units into the skin 3 (three) times daily before meals. Inject 10  units in the am and 10 units at lunch and dinner      . insulin glargine (LANTUS SOLOSTAR) 100 UNIT/ML injection Inject 20 Units into the skin at bedtime.  10 pen  3  . Insulin Pen Needle 31G X 8 MM MISC 1 each by Does not apply route 2 (two) times daily before a meal.  100 each  1  . lisinopril (PRINIVIL,ZESTRIL) 2.5 MG tablet Take 1 tablet (2.5 mg total) by mouth daily.  90 tablet  1  . lovastatin (MEVACOR) 40 MG tablet Take 1 tablet (40 mg total) by mouth at bedtime.  90 tablet  1  . Multiple Vitamin (MULTIVITAMIN) tablet Take 1 tablet by mouth daily.        . nitroGLYCERIN  (NITROSTAT) 0.4 MG SL tablet Place 1 tablet (0.4 mg total) under the tongue every 5 (five) minutes as needed. For chest pain  25 tablet  6  . ondansetron (ZOFRAN) 4 MG tablet Take 1 tablet (4 mg total) by mouth every 8 (eight) hours as needed for nausea.  20 tablet  0   No current facility-administered medications on file prior to visit.    Allergies  Allergen Reactions  . Pioglitazone     REACTION: Edema Actos.  . Sulfonamide Derivatives     hives    Review of Systems  Review of Systems  Constitutional: Negative for fever and malaise/fatigue.  HENT: Negative for congestion.   Eyes: Negative for discharge.  Respiratory: Negative for shortness of breath.   Cardiovascular: Negative for chest pain, palpitations and leg swelling.  Gastrointestinal: Negative for nausea, abdominal pain and diarrhea.  Genitourinary: Negative for dysuria.  Musculoskeletal: Negative for falls.  Skin: Negative for rash.  Neurological: Negative for loss of consciousness and headaches.  Endo/Heme/Allergies: Negative for polydipsia.  Psychiatric/Behavioral: Negative for depression and suicidal ideas. The patient is not nervous/anxious and does not have insomnia.     Objective  BP 126/62  Pulse 72  Temp(Src) 97.9 F (36.6 C) (Oral)  Ht 5\' 7"  (1.702 m)  Wt 187 lb (84.823 kg)  BMI 29.28 kg/m2  SpO2 98%  Physical Exam  Physical Exam  Constitutional: She is oriented to person, place, and time and well-developed, well-nourished, and in no distress. No distress.  HENT:  Head: Normocephalic and atraumatic.  Eyes: Conjunctivae are normal.  Neck: Neck supple. No thyromegaly present.  Cardiovascular: Normal rate, regular rhythm and normal heart sounds.   No murmur heard. Pulmonary/Chest: Effort normal and breath sounds normal. She has no wheezes.  Abdominal: She exhibits no distension and no mass.  Musculoskeletal: She exhibits no edema.  Lymphadenopathy:    She has no cervical adenopathy.   Neurological: She is alert and oriented to person, place, and time.  Skin: Skin is warm and dry. No rash noted. She is not diaphoretic.  Psychiatric: Memory, affect and judgment normal.    Lab Results  Component Value Date   TSH 0.93 01/16/2009   Lab Results  Component Value Date   WBC 6.3 05/25/2012   HGB 12.7 05/25/2012   HCT 37.6 05/25/2012   MCV 89.1 05/25/2012   PLT 296 05/25/2012   Lab Results  Component Value Date   CREATININE 1.55* 05/25/2012   BUN 29* 05/25/2012   NA 144 05/25/2012   K 4.8 05/25/2012   CL 108 05/25/2012   CO2 25 05/25/2012   Lab Results  Component Value Date   ALT 13 05/25/2012   AST 18 05/25/2012   ALKPHOS 46 05/25/2012   BILITOT 0.4 05/25/2012   Lab Results  Component Value Date   CHOL 151 05/25/2012   Lab Results  Component Value Date   HDL 48 05/25/2012   Lab Results  Component Value Date   LDLCALC 81 05/25/2012   Lab Results  Component Value Date   TRIG 112 05/25/2012   Lab Results  Component Value Date   CHOLHDL 3.1 05/25/2012     Assessment & Plan  DIABETES MELLITUS, TYPE II Patient reports fasting sugars from 90 to 120, denies poyluria or polydipsia. Continue current dose of insulin  HYPERTENSION Well controlled no change in meds today  RENAL DISEASE, CHRONIC, MILD Stable, no changes  HYPERLIPIDEMIA Tolerating mevacor, avoid trans fats, consider krill oil caps  GERD No c/o today, without meds

## 2012-06-16 ENCOUNTER — Telehealth: Payer: Self-pay | Admitting: Family Medicine

## 2012-06-16 MED ORDER — LISINOPRIL 2.5 MG PO TABS: 2.5000 mg | ORAL_TABLET | Freq: Every day | ORAL | Status: DC

## 2012-06-16 NOTE — Telephone Encounter (Signed)
Refill- lisinopril 2.5mg  tab. Take one tablet by mouth every day. Qty 90 last fill 2.11.14

## 2012-06-16 NOTE — Telephone Encounter (Signed)
rx sent

## 2012-07-30 ENCOUNTER — Telehealth: Payer: Self-pay | Admitting: Family Medicine

## 2012-07-30 NOTE — Telephone Encounter (Signed)
Refill- carvedilol 6.25mg  tab. Take one tablet by mouth twice daily with a meal. Qty 180 last fill 11.19.13

## 2012-07-31 MED ORDER — CARVEDILOL 6.25 MG PO TABS: 6.2500 mg | ORAL_TABLET | Freq: Two times a day (BID) | ORAL | Status: DC

## 2012-08-19 ENCOUNTER — Encounter: Payer: Self-pay | Admitting: Gastroenterology

## 2012-09-02 ENCOUNTER — Telehealth: Payer: Self-pay | Admitting: Family Medicine

## 2012-09-02 NOTE — Telephone Encounter (Signed)
Refill- mevacor 40mg  tablet. Take one tablet by mouth at bedtime. Qty 90 last fill 2.11.14

## 2012-09-03 MED ORDER — LOVASTATIN 40 MG PO TABS: 40.0000 mg | ORAL_TABLET | Freq: Every day | ORAL | Status: DC

## 2012-09-03 NOTE — Telephone Encounter (Signed)
Rx sent in to pharmacy. 

## 2012-10-05 ENCOUNTER — Ambulatory Visit: Payer: Medicare Other | Admitting: Family Medicine

## 2012-10-08 LAB — CBC
HCT: 35.1 % — ABNORMAL LOW (ref 36.0–46.0)
Hemoglobin: 11.7 g/dL — ABNORMAL LOW (ref 12.0–15.0)
MCH: 29.3 pg (ref 26.0–34.0)
MCHC: 33.3 g/dL (ref 30.0–36.0)

## 2012-10-08 LAB — RENAL FUNCTION PANEL
BUN: 31 mg/dL — ABNORMAL HIGH (ref 6–23)
CO2: 22 mEq/L (ref 19–32)
Chloride: 110 mEq/L (ref 96–112)
Creat: 1.54 mg/dL — ABNORMAL HIGH (ref 0.50–1.10)
Phosphorus: 3.8 mg/dL (ref 2.3–4.6)

## 2012-10-08 LAB — HEMOGLOBIN A1C: Hgb A1c MFr Bld: 7.4 % — ABNORMAL HIGH (ref <5.7)

## 2012-10-08 LAB — HEPATIC FUNCTION PANEL
ALT: 11 U/L (ref 0–35)
AST: 16 U/L (ref 0–37)
Albumin: 3.7 g/dL (ref 3.5–5.2)

## 2012-10-12 ENCOUNTER — Encounter: Payer: Self-pay | Admitting: Family Medicine

## 2012-10-12 ENCOUNTER — Ambulatory Visit (INDEPENDENT_AMBULATORY_CARE_PROVIDER_SITE_OTHER): Payer: Medicare Other | Admitting: Family Medicine

## 2012-10-12 VITALS — BP 128/62 | HR 68 | Temp 97.6°F | Ht 67.0 in | Wt 185.0 lb

## 2012-10-12 DIAGNOSIS — I2510 Atherosclerotic heart disease of native coronary artery without angina pectoris: Secondary | ICD-10-CM

## 2012-10-12 DIAGNOSIS — K219 Gastro-esophageal reflux disease without esophagitis: Secondary | ICD-10-CM

## 2012-10-12 DIAGNOSIS — E785 Hyperlipidemia, unspecified: Secondary | ICD-10-CM

## 2012-10-12 DIAGNOSIS — I10 Essential (primary) hypertension: Secondary | ICD-10-CM

## 2012-10-12 NOTE — Patient Instructions (Addendum)
Labs prior to visit, lipid, renal, cbc, tsh, hepatic, hgba1c  Increase Lantus to 22 units daily   Peripheral Neuropathy Peripheral neuropathy is a common disorder of your nerves resulting from damage. CAUSES  This disorder may be caused by a disease of the nerves or illness. Many neuropathies have well known causes such as:  Diabetes. This is one of the most common causes.   Uremia.   AIDS.   Nutritional deficiencies.   Other causes include mechanical pressures. These may be from:   Compression.   Injury.   Contusions or bruises.   Fracture or dislocated bones.   Pressure involving the nerves close to the surface. Nerves such as the ulnar, or radial can be injured by prolonged use of crutches.  Other injuries may come from:  Tumor.   Hemorrhage or bleeding into a nerve.   Exposure to cold or radiation.   Certain medicines or toxic substances (rare).   Vascular or collagen disorders such as:   Atherosclerosis.   Systemic lupus erythematosus.   Scleroderma.   Sarcoidosis.   Rheumatoid arthritis.   Polyarteritis nodosa.   A large number of cases are of unknown cause.  SYMPTOMS  Common problems include:  Weakness.   Numbness.   Abnormal sensations (paresthesia) such as:   Burning.   Tickling.   Pricking.   Tingling.   Pain in the arms, hands, legs and/or feet.  TREATMENT  Therapy for this disorder differs depending on the cause. It may vary from medical treatment with medications or physical therapy among others.   For example, therapy for this disorder caused by diabetes involves control of the diabetes.   In cases where a tumor or ruptured disc is the cause, therapy may involve surgery. This would be to remove the tumor or to repair the ruptured disc.   In entrapment or compression neuropathy, treatment may consist of splinting or surgical decompression of the ulnar or median nerves. A common example of entrapment neuropathy is carpal  tunnel syndrome. This has become more common because of the increasing use of computers.   Peroneal and radial compression neuropathies may require avoidance of pressure.   Physical therapy and/or splints may be useful in preventing contractures. This is a condition in which shortened muscles around joints cause abnormal and sometimes painful positioning of the joints.  Document Released: 04/05/2002 Document Revised: 12/26/2010 Document Reviewed: 04/15/2005 [facility] Patient Information 2012 [place], [place].

## 2012-10-14 ENCOUNTER — Encounter: Payer: Self-pay | Admitting: Family Medicine

## 2012-10-14 NOTE — Progress Notes (Signed)
Patient ID: [name], female   DOB: 1934/02/10, 79 y.o.   MRN: [id number] [name] [id number] 1933/06/07 10/14/2012      Progress Note-Follow Up  Subjective  Chief Complaint  Chief Complaint  Patient presents with  . Follow-up    3 month    HPI  Patient is a 79 year old Caucasian female who is in today for followup. Is complaining of a tingling hands the arms times. No other acute complaints. Blood sugars have been ranging from 50 t0 160. Eating well and voiding normally  Past Medical History  Diagnosis Date  . Hypertension   . Hyperlipidemia   . GERD (gastroesophageal reflux disease)   . Anemia   . CAD (coronary artery disease)     status Taxus drug-eluding stent to the LAD December 2006  . Edema     lower extremity with normal BNP  . Encounter for long-term (current) use of medications   . Hyperkalemia   . Glossitis   . Leg pain, left   . Routine general medical examination at a health care facility   . Chronic renal disease     mild  . Pyuria   . Anemia, iron deficiency   . UTI (urinary tract infection)   . Pain in foot     bilateral  . Osteopenia   . Diverticulosis of colon   . Diabetes mellitus type II   . Polyneuropathy     Past Surgical History  Procedure Laterality Date  . Cholecystectomy    . Total abdominal hysterectomy  1969    no BSO  . Ptca      stent to LAD in 03/2005, recatheterization inJanuarry 2007 for recurrent chest pain  that showed a patent LAD stent with normal LV function  . Esophagogastroduodenoscopy  04/15/2006  . Cardiovascular stress test  02/24/2006  . Electrocardiogram  06/18/2006    Family History  Problem Relation Age of Onset  . Heart disease Mother   . Heart disease Sister     History   Social History  . Marital Status: Married    Spouse Name: N/A    Number of Children: N/A  . Years of Education: N/A   Occupational History  . Not on file.   Social History Main Topics  . Smoking status: Never  Smoker   . Smokeless tobacco: Never Used  . Alcohol Use: No  . Drug Use: No  . Sexually Active: Not on file   Other Topics Concern  . 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 aspirin EC 81 MG tablet Take 81 mg by mouth daily.      Marland Kitchen BESIVANCE 0.6 % SUSP AS DIRECTED      . calcium carbonate (TUMS - DOSED IN MG ELEMENTAL CALCIUM) 500 MG chewable tablet Chew 1 tablet by mouth 3 (three) times daily as needed. For acid reflux       . carvedilol (COREG) 6.25 MG tablet Take 1 tablet (6.25 mg total) by mouth 2 (two) times daily with a meal.  180 tablet  1  . DUREZOL 0.05 % EMUL AS DIRECTED      . fenofibrate 54 MG tablet Take 1  tablet (54 mg total) by mouth daily.  90 tablet  2  . glucose blood (FREESTYLE LITE) test strip Use as instructed to test blood glucose. Dx code 250.00  200 each  5  . insulin aspart (NOVOLOG) 100 UNIT/ML injection Inject 5-10 Units into the skin 3 (three) times daily before meals. Inject 10  units in the am and 10 units at lunch and dinner      . insulin glargine (LANTUS SOLOSTAR) 100 UNIT/ML injection Inject 20 Units into the skin at bedtime.  10 pen  3  . Insulin Pen Needle 31G X 8 MM MISC 1 each by Does not apply route 2 (two) times daily before a meal.  100 each  1  . lisinopril (PRINIVIL,ZESTRIL) 2.5 MG tablet Take 1 tablet (2.5 mg total) by mouth daily.  90 tablet  1  . lovastatin (MEVACOR) 40 MG tablet Take 1 tablet (40 mg total) by mouth at bedtime.  90 tablet  1  . Multiple Vitamin (MULTIVITAMIN) tablet Take 1 tablet by mouth daily.        . nitroGLYCERIN (NITROSTAT) 0.4 MG SL tablet Place 1 tablet (0.4 mg total) under the tongue every 5 (five) minutes as needed. For chest pain  25 tablet  6  . ondansetron (ZOFRAN) 4 MG tablet Take 1 tablet (4 mg total)  by mouth every 8 (eight) hours as needed for nausea.  20 tablet  0   No current facility-administered medications on file prior to visit.    Allergies  Allergen Reactions  . Pioglitazone     REACTION: Edema Actos.  . Sulfonamide Derivatives     hives    Review of Systems  Review of Systems  Constitutional: Negative for fever and malaise/fatigue.  HENT: Negative for congestion.   Eyes: Negative for discharge.  Respiratory: Negative for shortness of breath.   Cardiovascular: Negative for chest pain, palpitations and leg swelling.  Gastrointestinal: Negative for nausea, abdominal pain and diarrhea.  Genitourinary: Negative for dysuria.  Musculoskeletal: Negative for falls.  Skin: Negative for rash.  Neurological: Positive for sensory change. Negative for loss of consciousness and headaches.  Endo/Heme/Allergies: Negative for polydipsia.  Psychiatric/Behavioral: Negative for depression and suicidal ideas. The patient is not nervous/anxious and does not have insomnia.     Objective  BP 128/62  Pulse 68  Temp(Src) 97.6 F (36.4 C) (Oral)  Ht 5\' 7"  (1.702 m)  Wt 185 lb (83.915 kg)  BMI 28.97 kg/m2  SpO2 96%  Physical Exam  Physical Exam  Constitutional: She is oriented to person, place, and time and well-developed, well-nourished, and in no distress. No distress.  HENT:  Head: Normocephalic and atraumatic.  Eyes: Conjunctivae are normal.  Neck: Neck supple. No thyromegaly present.  Cardiovascular: Normal rate, regular rhythm and normal heart sounds.   No murmur heard. Pulmonary/Chest: Effort normal and breath sounds normal. She has no wheezes.  Abdominal: She exhibits no distension and no mass.  Musculoskeletal: She exhibits no edema.  Lymphadenopathy:    She has no cervical adenopathy.  Neurological: She is alert and oriented to person, place, and time.  Skin: Skin is warm and dry. No rash noted. She is not diaphoretic.  Psychiatric: Memory, affect and judgment  normal.    Lab Results  Component Value Date   TSH 0.946 10/07/2012   Lab Results  Component Value Date   WBC 6.6 10/07/2012   HGB 11.7* 10/07/2012   HCT 35.1* 10/07/2012   MCV 88.0 10/07/2012   PLT 292  10/07/2012   Lab Results  Component Value Date   CREATININE 1.54* 10/07/2012   BUN 31* 10/07/2012   NA 143 10/07/2012   K 4.6 10/07/2012   CL 110 10/07/2012   CO2 22 10/07/2012   Lab Results  Component Value Date   ALT 11 10/07/2012   AST 16 10/07/2012   ALKPHOS 55 10/07/2012   BILITOT 0.4 10/07/2012   Lab Results  Component Value Date   CHOL 151 05/25/2012   Lab Results  Component Value Date   HDL 48 05/25/2012   Lab Results  Component Value Date   LDLCALC 81 05/25/2012   Lab Results  Component Value Date   TRIG 112 05/25/2012   Lab Results  Component Value Date   CHOLHDL 3.1 05/25/2012     Assessment & Plan  HYPERTENSION Well controlled  No changes today  HYPERLIPIDEMIA Tolerating statins, avoid trans fats, start fatty acid supplement  CORONARY ARTERY DISEASE Asymptomatic at this time  GERD Improved. May use meds prn

## 2012-10-15 NOTE — Assessment & Plan Note (Signed)
Tolerating statins, avoid trans fats, start fatty acid supplement

## 2012-10-15 NOTE — Assessment & Plan Note (Signed)
Improved. May use meds prn

## 2012-10-15 NOTE — Assessment & Plan Note (Signed)
Well controlled. No changes today. 

## 2012-10-15 NOTE — Assessment & Plan Note (Signed)
Asymptomatic at this time 

## 2012-10-20 ENCOUNTER — Telehealth: Payer: Self-pay

## 2012-10-20 ENCOUNTER — Other Ambulatory Visit: Payer: Medicare Other

## 2012-10-20 DIAGNOSIS — D649 Anemia, unspecified: Secondary | ICD-10-CM

## 2012-10-20 LAB — FECAL OCCULT BLOOD, IMMUNOCHEMICAL: Fecal Occult Bld: NEGATIVE

## 2012-10-20 NOTE — Telephone Encounter (Signed)
[facility] called stating they need an IFOB order placed? Please advise what diagnosis?

## 2012-10-20 NOTE — Telephone Encounter (Signed)
IFOB order placed

## 2012-10-20 NOTE — Telephone Encounter (Signed)
anemia

## 2012-11-02 ENCOUNTER — Other Ambulatory Visit: Payer: Self-pay | Admitting: Internal Medicine

## 2012-11-02 NOTE — Telephone Encounter (Signed)
Rx request to pharmacy/SLS  

## 2012-11-05 ENCOUNTER — Other Ambulatory Visit: Payer: Self-pay

## 2012-11-13 ENCOUNTER — Other Ambulatory Visit: Payer: Self-pay

## 2012-11-13 MED ORDER — FENOFIBRATE 54 MG PO TABS: 54.0000 mg | ORAL_TABLET | Freq: Every day | ORAL | Status: AC

## 2012-11-13 NOTE — Telephone Encounter (Signed)
RX sent

## 2012-11-23 ENCOUNTER — Encounter: Payer: Self-pay | Admitting: Family Medicine

## 2012-11-23 ENCOUNTER — Other Ambulatory Visit: Payer: Self-pay | Admitting: Internal Medicine

## 2012-11-23 ENCOUNTER — Ambulatory Visit (INDEPENDENT_AMBULATORY_CARE_PROVIDER_SITE_OTHER): Payer: Medicare Other | Admitting: Family Medicine

## 2012-11-23 VITALS — BP 120/60 | HR 75 | Temp 97.6°F | Ht 67.0 in | Wt 182.8 lb

## 2012-11-23 DIAGNOSIS — E119 Type 2 diabetes mellitus without complications: Secondary | ICD-10-CM

## 2012-11-23 DIAGNOSIS — L309 Dermatitis, unspecified: Secondary | ICD-10-CM

## 2012-11-23 DIAGNOSIS — L259 Unspecified contact dermatitis, unspecified cause: Secondary | ICD-10-CM

## 2012-11-23 DIAGNOSIS — I10 Essential (primary) hypertension: Secondary | ICD-10-CM

## 2012-11-23 HISTORY — DX: Dermatitis, unspecified: L30.9

## 2012-11-23 MED ORDER — TRIAMCINOLONE ACETONIDE 0.1 % EX CREA: TOPICAL_CREAM | Freq: Three times a day (TID) | CUTANEOUS | Status: DC | PRN

## 2012-11-23 NOTE — Telephone Encounter (Signed)
Request for Lantus Solostar Insulin; medication not on patient's EMR medication list/SLS Please advise.

## 2012-11-23 NOTE — Assessment & Plan Note (Signed)
Well controlled, no changes 

## 2012-11-23 NOTE — Telephone Encounter (Signed)
20 units daily was prescribed a year ago. OK to refill the lantus rx for 90 day with 1 rf if patient has a hgba1c in past 6 months, only 1 month worth if no hgba1c

## 2012-11-23 NOTE — Progress Notes (Signed)
Patient ID: [name], female   DOB: 1933/09/10, 79 y.o.   MRN: [id number] PHYLICIA MCGAUGH [id number] [birth year] 11/23/2012      Progress Note-Follow Up  Subjective  Chief Complaint  Chief Complaint  Patient presents with  . Rash    HPI  Patient is a 79 year old Caucasian female who is in today complaining of rash. It is pruritic and most notable on her arms. Been present about a week it is slowly worsening. Has not had relief with over-the-counter topical treatments. No chest pain, palpitations, shortness of, GI or GU complaints.  Past Medical History  Diagnosis Date  . Hypertension   . Hyperlipidemia   . GERD (gastroesophageal reflux disease)   . Anemia   . CAD (coronary artery disease)     status Taxus drug-eluding stent to the LAD December 2006  . Edema     lower extremity with normal BNP  . Encounter for long-term (current) use of medications   . Hyperkalemia   . Glossitis   . Leg pain, left   . Routine general medical examination at a health care facility   . Chronic renal disease     mild  . Pyuria   . Anemia, iron deficiency   . UTI (urinary tract infection)   . Pain in foot     bilateral  . Osteopenia   . Diverticulosis of colon   . Diabetes mellitus type II   . Polyneuropathy     Past Surgical History  Procedure Laterality Date  . Cholecystectomy    . Total abdominal hysterectomy  1969    no BSO  . Ptca      stent to LAD in 03/2005, recatheterization inJanuarry 2007 for recurrent chest pain  that showed a patent LAD stent with normal LV function  . Esophagogastroduodenoscopy  04/15/2006  . Cardiovascular stress test  02/24/2006  . Electrocardiogram  06/18/2006    Family History  Problem Relation Age of Onset  . Heart disease Mother   . Heart disease Sister     History   Social History  . Marital Status: Married    Spouse Name: N/A    Number of Children: N/A  . Years of Education: N/A   Occupational History  . Not on file.    Social History Main Topics  . Smoking status: Never Smoker   . Smokeless tobacco: Never Used  . Alcohol Use: No  . Drug Use: No  . Sexually Active: Not on file   Other Topics Concern  . 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 aspirin EC 81 MG tablet Take 81 mg by mouth daily.      Marland Kitchen BESIVANCE 0.6 % SUSP AS DIRECTED      . calcium carbonate (TUMS - DOSED IN MG ELEMENTAL CALCIUM) 500 MG chewable tablet Chew 1 tablet by mouth 3 (three) times daily as needed. For acid reflux       . carvedilol (COREG) 6.25 MG tablet Take 1 tablet (6.25 mg total) by mouth 2 (two) times daily with a meal.  180 tablet  1  . DUREZOL 0.05 % EMUL AS DIRECTED      . fenofibrate  54 MG tablet Take 1 tablet (54 mg total) by mouth daily.  90 tablet  1  . glucose blood (FREESTYLE LITE) test strip Use as instructed to test blood glucose. Dx code 250.00  200 each  5  . insulin aspart (NOVOLOG) 100 UNIT/ML injection Inject 5-10 Units into the skin 3 (three) times daily before meals. Inject 10  units in the am and 10 units at lunch and dinner      . Insulin Pen Needle 31G X 8 MM MISC 1 each by Does not apply route 2 (two) times daily before a meal.  100 each  1  . lisinopril (PRINIVIL,ZESTRIL) 2.5 MG tablet Take 1 tablet (2.5 mg total) by mouth daily.  90 tablet  1  . lovastatin (MEVACOR) 40 MG tablet Take 1 tablet (40 mg total) by mouth at bedtime.  90 tablet  1  . Multiple Vitamin (MULTIVITAMIN) tablet Take 1 tablet by mouth daily.        . nitroGLYCERIN (NITROSTAT) 0.4 MG SL tablet Place 1 tablet (0.4 mg total) under the tongue every 5 (five) minutes as needed. For chest pain  25 tablet  6  . NOVOLOG FLEXPEN 100 UNIT/ML SOPN FlexPen INJECT 10 UNITS SUBCUTANEOUSLY THREE TIMES DAILY BEFORE MEALS  30 mL  3  .  ondansetron (ZOFRAN) 4 MG tablet Take 1 tablet (4 mg total) by mouth every 8 (eight) hours as needed for nausea.  20 tablet  0   No current facility-administered medications on file prior to visit.    Allergies  Allergen Reactions  . Pioglitazone     REACTION: Edema Actos.  . Sulfonamide Derivatives     hives    Review of Systems  Review of Systems  Constitutional: Negative for fever and malaise/fatigue.  HENT: Negative for congestion.   Eyes: Negative for pain and discharge.  Respiratory: Negative for shortness of breath.   Cardiovascular: Negative for chest pain, palpitations and leg swelling.  Gastrointestinal: Negative for nausea, abdominal pain and diarrhea.  Genitourinary: Negative for dysuria.  Musculoskeletal: Negative for falls.  Skin: Positive for itching and rash.  Neurological: Negative for loss of consciousness and headaches.  Endo/Heme/Allergies: Negative for polydipsia.  Psychiatric/Behavioral: Negative for depression and suicidal ideas. The patient is not nervous/anxious and does not have insomnia.     Objective  BP 120/60  Pulse 75  Temp(Src) 97.6 F (36.4 C) (Oral)  Ht 5\' 7"  (1.702 m)  Wt 182 lb 12 oz (82.895 kg)  BMI 28.62 kg/m2  SpO2 95%  Physical Exam  Physical Exam  Constitutional: She is oriented to person, place, and time and well-developed, well-nourished, and in no distress. No distress.  HENT:  Head: Normocephalic and atraumatic.  Left Ear: External ear normal.  Eyes: Conjunctivae are normal.  Neck: Neck supple. No thyromegaly present.  Cardiovascular: Normal rate, regular rhythm and normal heart sounds.   No murmur heard. Pulmonary/Chest: Effort normal and breath sounds normal. She has no wheezes.  Abdominal: She exhibits no distension and no mass.  Musculoskeletal: She exhibits no edema.  Lymphadenopathy:    She has no cervical adenopathy.  Neurological: She is alert and oriented to person, place, and time.  Skin: Skin is warm  and dry. Rash noted. She is not diaphoretic.  Scattered, excoriated maculopapular lesions on R>L arm  Psychiatric: Memory, affect and judgment normal.    Lab Results  Component Value Date   TSH 0.946 10/07/2012   Lab Results  Component Value Date   WBC 6.6  10/07/2012   HGB 11.7* 10/07/2012   HCT 35.1* 10/07/2012   MCV 88.0 10/07/2012   PLT 292 10/07/2012   Lab Results  Component Value Date   CREATININE 1.54* 10/07/2012   BUN 31* 10/07/2012   NA 143 10/07/2012   K 4.6 10/07/2012   CL 110 10/07/2012   CO2 22 10/07/2012   Lab Results  Component Value Date   ALT 11 10/07/2012   AST 16 10/07/2012   ALKPHOS 55 10/07/2012   BILITOT 0.4 10/07/2012   Lab Results  Component Value Date   CHOL 151 05/25/2012   Lab Results  Component Value Date   HDL 48 05/25/2012   Lab Results  Component Value Date   LDLCALC 81 05/25/2012   Lab Results  Component Value Date   TRIG 112 05/25/2012   Lab Results  Component Value Date   CHOLHDL 3.1 05/25/2012     Assessment & Plan  DIABETES MELLITUS, TYPE II No flare in sugar numbers despite rash and stress, given samples of Novolog today due to donut hole approaching  HYPERTENSION Well controlled, no changes  Dermatitis Unclear etiology, distribution most c/w contact but could be eczema, start with Claritin in am, Benadryl in pm. Witch Hazel and Triamcinolone cream prn

## 2012-11-23 NOTE — Patient Instructions (Addendum)
Cleanse the area with [facility] Astringent then apply the steroid cream 2-3 x a day. Claritin/Loratadine 10 mg tab in am and if needed a Benadryl/Diphenhydramine 25 mg tab at bedtime for itching   Contact Dermatitis Contact dermatitis is a reaction to certain substances that touch the skin. Contact dermatitis can be either irritant contact dermatitis or allergic contact dermatitis. Irritant contact dermatitis does not require previous exposure to the substance for a reaction to occur.Allergic contact dermatitis only occurs if you have been exposed to the substance before. Upon a repeat exposure, your body reacts to the substance.  CAUSES  Many substances can cause contact dermatitis. Irritant dermatitis is most commonly caused by repeated exposure to mildly irritating substances, such as:  Makeup.  Soaps.  Detergents.  Bleaches.  Acids.  Metal salts, such as nickel. Allergic contact dermatitis is most commonly caused by exposure to:  Poisonous plants.  Chemicals (deodorants, shampoos).  Jewelry.  Latex.  Neomycin in triple antibiotic cream.  Preservatives in products, including clothing. SYMPTOMS  The area of skin that is exposed may develop:  Dryness or flaking.  Redness.  Cracks.  Itching.  Pain or a burning sensation.  Blisters. With allergic contact dermatitis, there may also be swelling in areas such as the eyelids, mouth, or genitals.  DIAGNOSIS  Your caregiver can usually tell what the problem is by doing a physical exam. In cases where the cause is uncertain and an allergic contact dermatitis is suspected, a patch skin test may be performed to help determine the cause of your dermatitis. TREATMENT Treatment includes protecting the skin from further contact with the irritating substance by avoiding that substance if possible. Barrier creams, powders, and gloves may be helpful. Your caregiver may also recommend:  Steroid creams or ointments applied 2  times daily. For best results, soak the rash area in cool water for 20 minutes. Then apply the medicine. Cover the area with a plastic wrap. You can store the steroid cream in the refrigerator for a "chilly" effect on your rash. That may decrease itching. Oral steroid medicines may be needed in more severe cases.  Antibiotics or antibacterial ointments if a skin infection is present.  Antihistamine lotion or an antihistamine taken by mouth to ease itching.  Lubricants to keep moisture in your skin.  Burow's solution to reduce redness and soreness or to dry a weeping rash. Mix one packet or tablet of solution in 2 cups cool water. Dip a clean washcloth in the mixture, wring it out a bit, and put it on the affected area. Leave the cloth in place for 30 minutes. Do this as often as possible throughout the day.  Taking several cornstarch or baking soda baths daily if the area is too large to cover with a washcloth. Harsh chemicals, such as alkalis or acids, can cause skin damage that is like a burn. You should flush your skin for 15 to 20 minutes with cold water after such an exposure. You should also seek immediate medical care after exposure. Bandages (dressings), antibiotics, and pain medicine may be needed for severely irritated skin.  HOME CARE INSTRUCTIONS  Avoid the substance that caused your reaction.  Keep the area of skin that is affected away from hot water, soap, sunlight, chemicals, acidic substances, or anything else that would irritate your skin.  Do not scratch the rash. Scratching may cause the rash to become infected.  You may take cool baths to help stop the itching.  Only take over-the-counter or prescription  medicines as directed by your caregiver.  See your caregiver for follow-up care as directed to make sure your skin is healing properly. SEEK MEDICAL CARE IF:   Your condition is not better after 3 days of treatment.  You seem to be getting worse.  You see signs of  infection such as swelling, tenderness, redness, soreness, or warmth in the affected area.  You have any problems related to your medicines. Document Released: 04/12/2000 Document Revised: 07/08/2011 Document Reviewed: 09/18/2010 [facility] Patient Information 2014 [place], [place].

## 2012-11-23 NOTE — Assessment & Plan Note (Signed)
No flare in sugar numbers despite rash and stress, given samples of Novolog today due to donut hole approaching

## 2012-11-23 NOTE — Assessment & Plan Note (Signed)
Unclear etiology, distribution most c/w contact but could be eczema, start with Claritin in am, Benadryl in pm. Witch Hazel and Triamcinolone cream prn

## 2012-11-24 NOTE — Telephone Encounter (Signed)
Did I send this to you?

## 2012-11-24 NOTE — Telephone Encounter (Signed)
A1C was 7.4 on 10-07-12

## 2012-12-16 LAB — HM MAMMOGRAPHY: HM Mammogram: NEGATIVE

## 2012-12-23 ENCOUNTER — Encounter: Payer: Self-pay | Admitting: Family Medicine

## 2013-01-05 ENCOUNTER — Encounter: Payer: Self-pay | Admitting: Family Medicine

## 2013-01-25 ENCOUNTER — Telehealth: Payer: Self-pay | Admitting: Family Medicine

## 2013-01-25 MED ORDER — CARVEDILOL 6.25 MG PO TABS: 6.2500 mg | ORAL_TABLET | Freq: Two times a day (BID) | ORAL | Status: DC

## 2013-01-25 NOTE — Telephone Encounter (Signed)
Refill- carvedilol 6.25mg  tab. Take one tablet by mouth twice daily with meals. Qty 180 last fill 6.29.14

## 2013-02-01 ENCOUNTER — Telehealth: Payer: Self-pay

## 2013-02-01 DIAGNOSIS — E119 Type 2 diabetes mellitus without complications: Secondary | ICD-10-CM

## 2013-02-01 DIAGNOSIS — I10 Essential (primary) hypertension: Secondary | ICD-10-CM

## 2013-02-01 DIAGNOSIS — E785 Hyperlipidemia, unspecified: Secondary | ICD-10-CM

## 2013-02-01 LAB — LIPID PANEL
HDL: 47 mg/dL (ref >39)
LDL Cholesterol: 86 mg/dL (ref 0–99)
Total CHOL/HDL Ratio: 3.4 Ratio

## 2013-02-01 LAB — HEPATIC FUNCTION PANEL
Alkaline Phosphatase: 52 U/L (ref 39–117)
Bilirubin, Direct: 0.1 mg/dL (ref 0.0–0.3)
Indirect Bilirubin: 0.3 mg/dL (ref 0.0–0.9)
Total Bilirubin: 0.4 mg/dL (ref 0.3–1.2)
Total Protein: 6.5 g/dL (ref 6.0–8.3)

## 2013-02-01 LAB — RENAL FUNCTION PANEL
Albumin: 3.9 g/dL (ref 3.5–5.2)
Calcium: 9.5 mg/dL (ref 8.4–10.5)
Phosphorus: 3.8 mg/dL (ref 2.3–4.6)
Potassium: 4.9 mEq/L (ref 3.5–5.3)
Sodium: 139 mEq/L (ref 135–145)

## 2013-02-01 LAB — CBC
Hemoglobin: 12.7 g/dL (ref 12.0–15.0)
Platelets: 305 10*3/uL (ref 150–400)
RBC: 4.24 MIL/uL (ref 3.87–5.11)
WBC: 7 10*3/uL (ref 4.0–10.5)

## 2013-02-01 LAB — HEMOGLOBIN A1C: Mean Plasma Glucose: 169 mg/dL — ABNORMAL HIGH (ref <117)

## 2013-02-01 NOTE — Telephone Encounter (Signed)
Lab order placed.

## 2013-02-02 LAB — TSH: TSH: 1.458 u[IU]/mL (ref 0.350–4.500)

## 2013-02-08 ENCOUNTER — Ambulatory Visit (INDEPENDENT_AMBULATORY_CARE_PROVIDER_SITE_OTHER): Payer: Medicare Other | Admitting: Family Medicine

## 2013-02-08 ENCOUNTER — Encounter: Payer: Self-pay | Admitting: Family Medicine

## 2013-02-08 VITALS — BP 102/70 | HR 64 | Temp 98.0°F | Ht 67.0 in | Wt 180.1 lb

## 2013-02-08 DIAGNOSIS — I10 Essential (primary) hypertension: Secondary | ICD-10-CM

## 2013-02-08 DIAGNOSIS — Z23 Encounter for immunization: Secondary | ICD-10-CM

## 2013-02-08 DIAGNOSIS — E785 Hyperlipidemia, unspecified: Secondary | ICD-10-CM

## 2013-02-08 DIAGNOSIS — K219 Gastro-esophageal reflux disease without esophagitis: Secondary | ICD-10-CM

## 2013-02-08 DIAGNOSIS — D509 Iron deficiency anemia, unspecified: Secondary | ICD-10-CM

## 2013-02-08 DIAGNOSIS — E119 Type 2 diabetes mellitus without complications: Secondary | ICD-10-CM

## 2013-02-08 MED ORDER — INSULIN GLARGINE 100 UNIT/ML SOLOSTAR PEN: 24.0000 [IU] | PEN_INJECTOR | Freq: Every day | SUBCUTANEOUS | Status: DC

## 2013-02-08 NOTE — Progress Notes (Signed)
Patient ID: [name], female   DOB: 1933-08-06, 79 y.o.   MRN: [id number] [name] [id number] June 24, 1933 02/08/2013      Progress Note-Follow Up  Subjective  Chief Complaint  Chief Complaint  Patient presents with  . Follow-up    4 month   . Injections    flu- high risk    HPI  Patient is a Caucasian female, 79 years old. She is here today for followup. She feels well. She notes her blood sugars are improving. Her numbers have run between 100 and 150 the vast majority of the time. She is only seen one number below 100 in the 90s. No polyuria or polydipsia. She is using 22 units of Lantus daily. Denies any recent illness. No chest pain no palpitations, headaches, shortness of breath, GI or GU complaints.  Past Medical History  Diagnosis Date  . Hypertension   . Hyperlipidemia   . GERD (gastroesophageal reflux disease)   . Anemia   . CAD (coronary artery disease)     status Taxus drug-eluding stent to the LAD December 2006  . Edema     lower extremity with normal BNP  . Encounter for long-term (current) use of medications   . Hyperkalemia   . Glossitis   . Leg pain, left   . Routine general medical examination at a health care facility   . Chronic renal disease     mild  . Pyuria   . Anemia, iron deficiency   . UTI (urinary tract infection)   . Pain in foot     bilateral  . Osteopenia   . Diverticulosis of colon   . Diabetes mellitus type II   . Polyneuropathy   . Dermatitis 11/23/2012    Past Surgical History  Procedure Laterality Date  . Cholecystectomy    . Total abdominal hysterectomy  1969    no BSO  . Ptca      stent to LAD in 03/2005, recatheterization inJanuarry 2007 for recurrent chest pain  that showed a patent LAD stent with normal LV function  . Esophagogastroduodenoscopy  04/15/2006  . Cardiovascular stress test  02/24/2006  . Electrocardiogram  06/18/2006    Family History  Problem Relation Age of Onset  . Heart disease  Mother   . Heart disease Sister     History   Social History  . Marital Status: Married    Spouse Name: N/A    Number of Children: N/A  . Years of Education: N/A   Occupational History  . Not on file.   Social History Main Topics  . Smoking status: Never Smoker   . Smokeless tobacco: Never Used  . Alcohol Use: No  . Drug Use: No  . Sexual Activity: Not on file   Other Topics Concern  . 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 aspirin EC 81 MG tablet Take 81 mg by mouth daily.      Marland Kitchen BESIVANCE 0.6 % SUSP AS DIRECTED      . calcium carbonate (TUMS - DOSED IN MG ELEMENTAL CALCIUM) 500 MG chewable tablet Chew 1 tablet by mouth 3 (three) times daily as needed. For acid  reflux       . carvedilol (COREG) 6.25 MG tablet Take 1 tablet (6.25 mg total) by mouth 2 (two) times daily with a meal.  180 tablet  1  . DUREZOL 0.05 % EMUL AS DIRECTED      . fenofibrate 54 MG tablet Take 1 tablet (54 mg total) by mouth daily.  90 tablet  1  . glucose blood (FREESTYLE LITE) test strip Use as instructed to test blood glucose. Dx code 250.00  200 each  5  . insulin aspart (NOVOLOG) 100 UNIT/ML injection Inject 5-10 Units into the skin 3 (three) times daily before meals. Inject 10  units in the am and 10 units at lunch and dinner      . Insulin Pen Needle 31G X 8 MM MISC 1 each by Does not apply route 2 (two) times daily before a meal.  100 each  1  . LANTUS SOLOSTAR 100 UNIT/ML SOPN INJECT 20 UNITS SUBCUTANEOUSLY AT BEDTIME  15 pen  1  . lisinopril (PRINIVIL,ZESTRIL) 2.5 MG tablet Take 1 tablet (2.5 mg total) by mouth daily.  90 tablet  1  . lovastatin (MEVACOR) 40 MG tablet Take 1 tablet (40 mg total) by mouth at bedtime.  90 tablet  1  . Multiple Vitamin (MULTIVITAMIN) tablet Take 1  tablet by mouth daily.        . nitroGLYCERIN (NITROSTAT) 0.4 MG SL tablet Place 1 tablet (0.4 mg total) under the tongue every 5 (five) minutes as needed. For chest pain  25 tablet  6  . NOVOLOG FLEXPEN 100 UNIT/ML SOPN FlexPen INJECT 10 UNITS SUBCUTANEOUSLY THREE TIMES DAILY BEFORE MEALS  30 mL  3  . ondansetron (ZOFRAN) 4 MG tablet Take 1 tablet (4 mg total) by mouth every 8 (eight) hours as needed for nausea.  20 tablet  0  . triamcinolone cream (KENALOG) 0.1 % Apply topically 3 (three) times daily as needed.  45 g  0   No current facility-administered medications on file prior to visit.    Allergies  Allergen Reactions  . Pioglitazone     REACTION: Edema Actos.  . Sulfonamide Derivatives     hives    Review of Systems  Review of Systems  Constitutional: Negative for fever and malaise/fatigue.  HENT: Negative for congestion.   Eyes: Negative for discharge.  Respiratory: Negative for shortness of breath.   Cardiovascular: Negative for chest pain, palpitations and leg swelling.  Gastrointestinal: Negative for nausea, abdominal pain and diarrhea.  Genitourinary: Negative for dysuria.  Musculoskeletal: Negative for falls.  Skin: Negative for rash.  Neurological: Negative for loss of consciousness and headaches.  Endo/Heme/Allergies: Negative for polydipsia.  Psychiatric/Behavioral: Negative for depression and suicidal ideas. The patient is not nervous/anxious and does not have insomnia.     Objective  BP 102/70  Pulse 64  Temp(Src) 98 F (36.7 C) (Oral)  Ht 5\' 7"  (1.702 m)  Wt 180 lb 1.9 oz (81.702 kg)  BMI 28.2 kg/m2  SpO2 96%  Physical Exam  Physical Exam  Constitutional: She is oriented to person, place, and time and well-developed, well-nourished, and in no distress. No distress.  HENT:  Head: Normocephalic and atraumatic.  Eyes: Conjunctivae are normal.  Neck: Neck supple. No thyromegaly present.  Cardiovascular: Normal rate, regular rhythm and normal heart  sounds.   No murmur heard. Pulmonary/Chest: Effort normal and breath sounds normal. She has no wheezes.  Abdominal: She exhibits no distension and no mass.  Musculoskeletal: She exhibits no edema.  Lymphadenopathy:    She has no cervical adenopathy.  Neurological: She is alert and oriented to person, place, and time.  Skin: Skin is warm and dry. No rash noted. She is not diaphoretic.  Psychiatric: Memory, affect and judgment normal.    Lab Results  Component Value Date   TSH 1.458 02/01/2013   Lab Results  Component Value Date   WBC 7.0 02/01/2013   HGB 12.7 02/01/2013   HCT 37.2 02/01/2013   MCV 87.7 02/01/2013   PLT 305 02/01/2013   Lab Results  Component Value Date   CREATININE 1.50* 02/01/2013   BUN 25* 02/01/2013   NA 139 02/01/2013   K 4.9 02/01/2013   CL 107 02/01/2013   CO2 24 02/01/2013   Lab Results  Component Value Date   ALT 14 02/01/2013   AST 19 02/01/2013   ALKPHOS 52 02/01/2013   BILITOT 0.4 02/01/2013   Lab Results  Component Value Date   CHOL 159 02/01/2013   Lab Results  Component Value Date   HDL 47 02/01/2013   Lab Results  Component Value Date   LDLCALC 86 02/01/2013   Lab Results  Component Value Date   TRIG 132 02/01/2013   Lab Results  Component Value Date   CHOLHDL 3.4 02/01/2013     Assessment & Plan  HYPERTENSION Well controlled, no changes.  DIABETES MELLITUS, TYPE II Encouraged to avoid simple carbs and increase Lantus by 2 more units. Given flu shot today  ANEMIA, IRON DEFICIENCY Resolved, will continue to monitor  HYPERLIPIDEMIA Tolerating Lovastatin and and Fenofibrate, cholesterol well controlled  GERD Avoid offending foods, continue current meds

## 2013-02-08 NOTE — Patient Instructions (Signed)

## 2013-02-10 NOTE — Assessment & Plan Note (Signed)
Encouraged to avoid simple carbs and increase Lantus by 2 more units. Given flu shot today

## 2013-02-10 NOTE — Assessment & Plan Note (Signed)
Tolerating Lovastatin and and Fenofibrate, cholesterol well controlled

## 2013-02-10 NOTE — Assessment & Plan Note (Signed)
Resolved, will continue to monitor 

## 2013-02-10 NOTE — Assessment & Plan Note (Signed)
Avoid offending foods, continue current meds

## 2013-02-10 NOTE — Assessment & Plan Note (Signed)
Well controlled, no changes 

## 2013-03-01 ENCOUNTER — Other Ambulatory Visit: Payer: Self-pay | Admitting: Family Medicine

## 2013-03-12 ENCOUNTER — Other Ambulatory Visit: Payer: Self-pay | Admitting: *Deleted

## 2013-03-12 DIAGNOSIS — E119 Type 2 diabetes mellitus without complications: Secondary | ICD-10-CM

## 2013-03-12 MED ORDER — INSULIN ASPART 100 UNIT/ML FLEXPEN: PEN_INJECTOR | SUBCUTANEOUS | Status: DC

## 2013-03-12 MED ORDER — INSULIN GLARGINE 100 UNIT/ML SOLOSTAR PEN: 24.0000 [IU] | PEN_INJECTOR | Freq: Every day | SUBCUTANEOUS | Status: DC

## 2013-03-12 NOTE — Telephone Encounter (Signed)
Pt request samples of Lantus, if available [none], and/or Rx for Lantus & Novolog to [facility] Rx request to pharmacy.

## 2013-03-22 ENCOUNTER — Telehealth: Payer: Self-pay | Admitting: Family Medicine

## 2013-03-22 NOTE — Telephone Encounter (Signed)
PATIENT DROPPED OFF PAPERWORK ON 03-15-2013 FOR A NEW MACHINE AND STRIPS FOR HER BLOOD TESTING.  SHE HAS NOT HEARD BACK AND SPOKE WITH A NURSE ON Thursday AND STILL HAS NOT HEARD IF HER PAPERWORK IS READY  PLEASE ADVISE

## 2013-03-23 NOTE — Telephone Encounter (Signed)
Have you seen this paperwork? I thought I put it on your desk Thursday after I talked to patient?

## 2013-03-24 ENCOUNTER — Telehealth: Payer: Self-pay | Admitting: Family Medicine

## 2013-03-24 MED ORDER — GLUCOSE BLOOD VI STRP: ORAL_STRIP | Status: DC

## 2013-03-24 MED ORDER — ONETOUCH ULTRA MINI W/DEVICE KIT: PACK | Status: AC

## 2013-03-24 NOTE — Telephone Encounter (Signed)
Spoke with pt re: mail order pharmacy and she states she doesn't use mail order. Rxs sent to [facility] on [address]. Pt aware.

## 2013-03-24 NOTE — Telephone Encounter (Signed)
She had brought the paperwork for a new meter and strips by and the paperwork cant be found.   She needs a 1 touch ultra mini and strips to her mail order pharmacy

## 2013-05-03 ENCOUNTER — Encounter: Payer: Self-pay | Admitting: Cardiovascular Disease

## 2013-05-03 ENCOUNTER — Ambulatory Visit (INDEPENDENT_AMBULATORY_CARE_PROVIDER_SITE_OTHER): Payer: Medicare Other | Admitting: Cardiovascular Disease

## 2013-05-03 VITALS — BP 132/62 | HR 77 | Ht 67.0 in | Wt 186.0 lb

## 2013-05-03 DIAGNOSIS — E785 Hyperlipidemia, unspecified: Secondary | ICD-10-CM

## 2013-05-03 DIAGNOSIS — I2510 Atherosclerotic heart disease of native coronary artery without angina pectoris: Secondary | ICD-10-CM

## 2013-05-03 DIAGNOSIS — I10 Essential (primary) hypertension: Secondary | ICD-10-CM

## 2013-05-03 NOTE — Progress Notes (Signed)
History of Present Illness: 80 yo WF with a history of CAD status post previous Taxus LAD stent Dec 2006, HTN, HLD, neuropathy and diabetes who is here today for cardiac follow up. She has been followed in the past by Dr. Haroldine Laws. Last cath January 2007 with moderate three vessel CAD, patent LAD stent and severe stenosis in the small caliber diagonal branches. She has done well with medical therapy since then.   She is here today for follow up. She has been doing well. No chest pain, SOB, palpitations, near syncope or syncope.   Primary Care Physician: Dr. Elizebeth Koller  Last Lipid Profile:Lipid Panel     Component Value Date/Time   CHOL 159 02/01/2013 1539   TRIG 132 02/01/2013 1539   HDL 47 02/01/2013 1539   CHOLHDL 3.4 02/01/2013 1539   VLDL 26 02/01/2013 1539   [place] 86 02/01/2013 1539     Past Medical History  Diagnosis Date  . Hypertension   . Hyperlipidemia   . GERD (gastroesophageal reflux disease)   . Anemia   . CAD (coronary artery disease)     status Taxus drug-eluding stent to the LAD December 2006  . Edema     lower extremity with normal BNP  . Encounter for long-term (current) use of medications   . Hyperkalemia   . Glossitis   . Leg pain, left   . Routine general medical examination at a health care facility   . Chronic renal disease     mild  . Pyuria   . Anemia, iron deficiency   . UTI (urinary tract infection)   . Pain in foot     bilateral  . Osteopenia   . Diverticulosis of colon   . Diabetes mellitus type II   . Polyneuropathy   . Dermatitis 11/23/2012    Past Surgical History  Procedure Laterality Date  . Cholecystectomy    . Total abdominal hysterectomy  1969    no BSO  . Ptca      stent to LAD in 03/2005, recatheterization inJanuarry 2007 for recurrent chest pain  that showed a patent LAD stent with normal LV function  . Esophagogastroduodenoscopy  04/15/2006  . Cardiovascular stress test  02/24/2006  . Electrocardiogram  06/18/2006     Current Outpatient Prescriptions  Medication Sig Dispense Refill  . aspirin EC 81 MG tablet Take 81 mg by mouth daily.      . Blood Glucose Monitoring Suppl (ONE TOUCH ULTRA MINI) W/DEVICE KIT Use to check blood sugar twice a day.  DX 250.00  1 each  0  . calcium carbonate (TUMS - DOSED IN MG ELEMENTAL CALCIUM) 500 MG chewable tablet Chew 1 tablet by mouth 3 (three) times daily as needed. For acid reflux       . carvedilol (COREG) 6.25 MG tablet Take 1 tablet (6.25 mg total) by mouth 2 (two) times daily with a meal.  180 tablet  1  . fenofibrate 54 MG tablet Take 1 tablet (54 mg total) by mouth daily.  90 tablet  1  . glucose blood test strip ONE TOUCH ULTRA TEST STRIP. Use as instructed to check blood sugar twice a day.  Dx 250.00      . insulin aspart (NOVOLOG FLEXPEN) 100 UNIT/ML SOPN FlexPen INJECT 10 UNITS SUBCUTANEOUSLY THREE TIMES DAILY BEFORE MEALS  30 mL  3  . Insulin Glargine (LANTUS SOLOSTAR) 100 UNIT/ML SOPN Inject 24 Units into the skin daily.  5 pen  3  . Insulin  Pen Needle 31G X 8 MM MISC 1 each by Does not apply route 2 (two) times daily before a meal.  100 each  1  . lisinopril (PRINIVIL,ZESTRIL) 2.5 MG tablet TAKE ONE TABLET BY MOUTH ONCE DAILY  90 tablet  0  . lovastatin (MEVACOR) 40 MG tablet TAKE ONE TABLET BY MOUTH AT BEDTIME  90 tablet  0  . Multiple Vitamin (MULTIVITAMIN) tablet Take 1 tablet by mouth daily.        . nitroGLYCERIN (NITROSTAT) 0.4 MG SL tablet Place 1 tablet (0.4 mg total) under the tongue every 5 (five) minutes as needed. For chest pain  25 tablet  6  . insulin aspart (NOVOLOG) 100 UNIT/ML injection Inject 5-10 Units into the skin 3 (three) times daily before meals. Inject 10  units in the am and 10 units at lunch and dinner       No current facility-administered medications for this visit.    Allergies  Allergen Reactions  . Pioglitazone     REACTION: Edema Actos.  . Sulfonamide Derivatives     hives    History   Social History  .  Marital Status: Married    Spouse Name: N/A    Number of Children: N/A  . Years of Education: N/A   Occupational History  . Not on file.   Social History Main Topics  . Smoking status: Never Smoker   . Smokeless tobacco: Never Used  . Alcohol Use: No  . Drug Use: No  . Sexual Activity: Not on file   Other Topics Concern  . Not on file   Social History Narrative   retired - from part- time gov work   married- 36 years   Never Smoked   Alcohol use-no     3 children              Family History  Problem Relation Age of Onset  . Heart disease Mother   . Heart disease Sister     Review of Systems:  As stated in the HPI and otherwise negative.   BP 132/62  Pulse 77  Ht _0  (1.702 m)  Wt 186 lb (84.369 kg)  BMI 29.12 kg/m2  Physical Examination: General: Well developed, well nourished, NAD HEENT: OP clear, mucus membranes moist SKIN: warm, dry. No rashes. Neuro: No focal deficits Musculoskeletal: Muscle strength 5/5 all ext Psychiatric: Mood and affect normal Neck: No JVD, no carotid bruits, no thyromegaly, no lymphadenopathy. Lungs:Clear bilaterally, no wheezes, rhonci, crackles Cardiovascular: Regular rate and rhythm. No murmurs, gallops or rubs. Abdomen:Soft. Bowel sounds present. Non-tender.  Extremities: No lower extremity edema. Pulses are 2 + in the bilateral DP/PT.  Cardiac cath January 2007:  Left main was normal.  LAD was a moderate-sized vessel. It gave off a moderate-sized first  diagonal, tiny second diagonal and a small third diagonal. There was  evidence of a previously placed stent in the midsection of the LAD, which  overlapped the second diagonal. This was widely patent. There was a 30%  stenosis in the LAD just after the stent. In the first diagonal, there was a  70-80% tubular stenosis which was unchanged from previous. In the tiny  second diagonal, there was a 99% ostial stenosis.  Left circumflex was a moderate-sized vessel made up  primarily of a large  branching OM-1. There was a 40% lesion in the circumflex leading into the  ostium of the OM-1.  Right coronary artery was a moderate-sized dominant vessel gave off  a  moderate-sized RV branch, a large PDA, small PL and a large second PL. There  was a 30% proximal stenosis in the RCA and a 30-40% midsection of the  takeoff of the RV branch. There was a 40% distally prior to the takeoff of  the PDA and a 30% very distal between the two PL's. In the RV branch, there  was a 95% proximal lesion. In the PDA, there was a 60% very distal lesion.  LV gram done in the RAO projection showed apparently a normal EF; however,  this was hard to tell given the significant amount of ectopy. There was no  mitral regurgitation.  ASSESSMENT:  1. The left anterior descending artery stent was widely patent with  residual coronary artery disease and a moderate-sized first diagonal and  tiny second diagonal. There has been no change in this since December of  2006.  2. Nonobstructive coronary disease in the right coronary artery and left  circumflex with high-grade lesion in a small right ventricular branch.   Assessment and Plan:   1. CAD: Stable. She is on good medical therapy. BP is well controlled. Lipids are controlled.   2. HTN: BP well controlled. No changes.   3. Hyperlipidemia: Lipids are well controlled. Continue statin.

## 2013-05-03 NOTE — Patient Instructions (Signed)
Your physician wants you to follow-up in:  12 months.  You will receive a reminder letter in the mail two months in advance. If you don't receive a letter, please call our office to schedule the follow-up appointment.   

## 2013-05-05 ENCOUNTER — Encounter: Payer: Self-pay | Admitting: Physician Assistant

## 2013-05-05 ENCOUNTER — Ambulatory Visit (INDEPENDENT_AMBULATORY_CARE_PROVIDER_SITE_OTHER): Payer: Medicare Other | Admitting: Physician Assistant

## 2013-05-05 VITALS — BP 126/68 | HR 72 | Temp 97.9°F | Resp 16 | Ht 67.0 in | Wt 184.0 lb

## 2013-05-05 DIAGNOSIS — B9789 Other viral agents as the cause of diseases classified elsewhere: Principal | ICD-10-CM

## 2013-05-05 DIAGNOSIS — J069 Acute upper respiratory infection, unspecified: Secondary | ICD-10-CM

## 2013-05-05 NOTE — Progress Notes (Signed)
Pre visit review using our clinic review tool, if applicable. No additional management support is needed unless otherwise documented below in the visit note/SLS  

## 2013-05-05 NOTE — Patient Instructions (Signed)
Increase fluid intake.  Rest.  Saline nasal spray.  Plain Mucinex.  Place a humidifier in the bedroom.  Hot showers.  Delsym for cough.  Please call if symptoms not improving over the next 48 hours of if tooth pain worsens.  Sinusitis Sinusitis is redness, soreness, and puffiness (inflammation) of the air pockets in the bones of your face (sinuses). The redness, soreness, and puffiness can cause air and mucus to get trapped in your sinuses. This can allow germs to grow and cause an infection.  HOME CARE   Drink enough fluids to keep your pee (urine) clear or pale yellow.  Use a humidifier in your home.  Run a hot shower to create steam in the bathroom. Sit in the bathroom with the door closed. Breathe in the steam 3 4 times a day.  Put a warm, moist washcloth on your face 3 4 times a day, or as told by your doctor.  Use salt water sprays (saline sprays) to wet the thick fluid in your nose. This can help the sinuses drain.  Only take medicine as told by your doctor. GET HELP RIGHT AWAY IF:   Your pain gets worse.  You have very bad headaches.  You are sick to your stomach (nauseous).  You throw up (vomit).  You are very sleepy (drowsy) all the time.  Your face is puffy (swollen).  Your vision changes.  You have a stiff neck.  You have trouble breathing. MAKE SURE YOU:   Understand these instructions.  Will watch your condition.  Will get help right away if you are not doing well or get worse. Document Released: 10/02/2007 Document Revised: 01/08/2012 Document Reviewed: 11/19/2011 [facility] Patient Information 2014 [place], [place].

## 2013-05-06 DIAGNOSIS — B9789 Other viral agents as the cause of diseases classified elsewhere: Principal | ICD-10-CM

## 2013-05-06 DIAGNOSIS — J069 Acute upper respiratory infection, unspecified: Secondary | ICD-10-CM | POA: Insufficient documentation

## 2013-05-06 NOTE — Assessment & Plan Note (Signed)
Symptoms most consistent with viral illness. Increase fluid intake. Rest. Saline nasal spray. Probiotic. Plain Mucinex. Delsym for cough. Humidifier in bedroom. Call or return to clinic of symptoms not improving within 48 hours.

## 2013-05-06 NOTE — Progress Notes (Signed)
Patient ID: [name], female   DOB: 16-Mar-1934, 80 y.o.   MRN: [id number]  Patient presents to clinic today complaining of 1-2 days of nasal congestion, sinus pressure, postnasal drip, scratchy throat and mild fatigue.  Patient denies fever, chills, shortness of breath or wheezing. Denies history of asthma or allergy. Denies recent travel or sick contact.  Past Medical History  Diagnosis Date  . Hypertension   . Hyperlipidemia   . GERD (gastroesophageal reflux disease)   . Anemia   . CAD (coronary artery disease)     status Taxus drug-eluding stent to the LAD December 2006  . Edema     lower extremity with normal BNP  . Encounter for long-term (current) use of medications   . Hyperkalemia   . Glossitis   . Leg pain, left   . Routine general medical examination at a health care facility   . Chronic renal disease     mild  . Pyuria   . Anemia, iron deficiency   . UTI (urinary tract infection)   . Pain in foot     bilateral  . Osteopenia   . Diverticulosis of colon   . Diabetes mellitus type II   . Polyneuropathy   . Dermatitis 11/23/2012    Current Outpatient Prescriptions on File Prior to Visit  Medication Sig Dispense Refill  . aspirin EC 81 MG tablet Take 81 mg by mouth daily.      . Blood Glucose Monitoring Suppl (ONE TOUCH ULTRA MINI) W/DEVICE KIT Use to check blood sugar twice a day.  DX 250.00  1 each  0  . calcium carbonate (TUMS - DOSED IN MG ELEMENTAL CALCIUM) 500 MG chewable tablet Chew 1 tablet by mouth 3 (three) times daily as needed. For acid reflux       . fenofibrate 54 MG tablet Take 1 tablet (54 mg total) by mouth daily.  90 tablet  1  . glucose blood test strip ONE TOUCH ULTRA TEST STRIP. Use as instructed to check blood sugar twice a day.  Dx 250.00      . insulin aspart (NOVOLOG FLEXPEN) 100 UNIT/ML SOPN FlexPen INJECT 10 UNITS SUBCUTANEOUSLY THREE TIMES DAILY BEFORE MEALS  30 mL  3  . Insulin Glargine (LANTUS SOLOSTAR) 100 UNIT/ML SOPN Inject 24  Units into the skin daily.  5 pen  3  . Insulin Pen Needle 31G X 8 MM MISC 1 each by Does not apply route 2 (two) times daily before a meal.  100 each  1  . lisinopril (PRINIVIL,ZESTRIL) 2.5 MG tablet TAKE ONE TABLET BY MOUTH ONCE DAILY  90 tablet  0  . lovastatin (MEVACOR) 40 MG tablet TAKE ONE TABLET BY MOUTH AT BEDTIME  90 tablet  0  . Multiple Vitamin (MULTIVITAMIN) tablet Take 1 tablet by mouth daily.        . nitroGLYCERIN (NITROSTAT) 0.4 MG SL tablet Place 1 tablet (0.4 mg total) under the tongue every 5 (five) minutes as needed. For chest pain  25 tablet  6  . carvedilol (COREG) 6.25 MG tablet Take 1 tablet (6.25 mg total) by mouth 2 (two) times daily with a meal.  180 tablet  1   No current facility-administered medications on file prior to visit.    Allergies  Allergen Reactions  . Pioglitazone     REACTION: Edema Actos.  . Sulfonamide Derivatives     hives    Family History  Problem Relation Age of Onset  . Heart disease Mother   .  Heart disease Sister     History   Social History  . Marital Status: Married    Spouse Name: N/A    Number of Children: N/A  . Years of Education: N/A   Social History Main Topics  . Smoking status: Never Smoker   . Smokeless tobacco: Never Used  . Alcohol Use: No  . Drug Use: No  . Sexual Activity: None   Other Topics Concern  . None   Social History Narrative   retired - from part- time gov work   married- 32 years   Never Smoked   Alcohol use-no     3 children             Review of Systems - See HPI.  All other ROS are negative.  Filed Vitals:   05/05/13 1418  BP: 126/68  Pulse: 72  Temp: 97.9 F (36.6 C)  Resp: 16   Physical Exam  Vitals reviewed. Constitutional: She is oriented to person, place, and time and well-developed, well-nourished, and in no distress.  HENT:  Head: Normocephalic and atraumatic.  Right Ear: External ear normal.  Left Ear: External ear normal.  Nose: Nose normal.  Mouth/Throat:  Oropharynx is clear and moist. No oropharyngeal exudate.  Tympanic membranes within normal limits bilaterally. No tenderness to percussion of sinuses noted on examination.  Eyes: Conjunctivae and EOM are normal. Pupils are equal, round, and reactive to light.  Neck: Normal range of motion. Neck supple.  Cardiovascular: Normal rate, regular rhythm, normal heart sounds and intact distal pulses.   Pulmonary/Chest: Breath sounds normal. No respiratory distress. She has no wheezes. She has no rales. She exhibits no tenderness.  Lymphadenopathy:    She has no cervical adenopathy.  Neurological: She is alert and oriented to person, place, and time.  Skin: Skin is warm and dry. No rash noted.  Psychiatric: Affect normal.    No results found for this or any previous visit (from the past 2160 hour(s)).  Assessment/Plan: No problem-specific assessment & plan notes found for this encounter.

## 2013-05-07 ENCOUNTER — Telehealth: Payer: Self-pay | Admitting: Family Medicine

## 2013-05-07 DIAGNOSIS — J329 Chronic sinusitis, unspecified: Secondary | ICD-10-CM

## 2013-05-07 MED ORDER — AZITHROMYCIN 250 MG PO TABS: ORAL_TABLET | ORAL | Status: DC

## 2013-05-07 NOTE — Telephone Encounter (Signed)
Patient states that she saw Malva Coganody Martin, PA-C earlier this week and is not any better. She states that she feels like she needs an antibiotic.

## 2013-05-07 NOTE — Telephone Encounter (Signed)
Rx Azithromycin.  Continue care as discussed at visit.  If symptoms are not improving with antibiotic, then patient needs to return to clinic for re-evaluation.

## 2013-05-07 NOTE — Telephone Encounter (Signed)
Please Advise/SLS  

## 2013-05-07 NOTE — Telephone Encounter (Signed)
LMOM with contact name and number [for return call, if needed] RE: requested ABX and further provider instructions/SLS

## 2013-05-10 ENCOUNTER — Telehealth: Payer: Self-pay | Admitting: Family Medicine

## 2013-05-10 NOTE — Telephone Encounter (Signed)
Regis BillSharon L Scates, CMA at 05/07/2013 11:32 AM    Status: Signed       LMOM with contact name and number [for return call, if needed] RE: requested ABX and further provider instructions/SLS        Piedad ClimesWilliam Cody Martin, PA-C at 05/07/2013 9:48 AM     Status: Signed        Rx Azithromycin. Continue care as discussed at visit. If symptoms are not improving with antibiotic, then patient needs to return to clinic for re-evaluation.       PLEASE SCHEDULE FOLLOW UP WITH AVAILABLE PROVIDER [[id number]] for Tuesday 01.13.15, PER 01.09.14 PHONE NOTE ATTACHED ABOVE. THANKS/SLS

## 2013-05-10 NOTE — Telephone Encounter (Signed)
Patient states that she does not feel any better since last visit. She thinks that she has bronchitis or pneumonia.

## 2013-05-11 NOTE — Telephone Encounter (Signed)
[facility] [place] requesting pt's most recent creatinine and GFR. Pt saw them yesterday and she states they faxed a records release to us as well?. Call [phone] with result.

## 2013-05-11 NOTE — Telephone Encounter (Signed)
Called patient to inform her that we would need to see her back in the office and she states that she went somewhere else yesterday. She also states that "little things" have happened and she will be seeking primary care elsewhere.

## 2013-06-10 ENCOUNTER — Ambulatory Visit: Payer: Medicare Other | Admitting: Family Medicine

## 2013-08-11 NOTE — Telephone Encounter (Signed)
Lab order

## 2013-12-12 ENCOUNTER — Inpatient Hospital Stay (HOSPITAL_COMMUNITY)
Admission: EM | Admit: 2013-12-12 | Discharge: 2013-12-15 | DRG: 247 | Disposition: A | Discharge status: Home / Self Care | Payer: Medicare Other | Attending: Internal Medicine | Admitting: Internal Medicine

## 2013-12-12 ENCOUNTER — Encounter (HOSPITAL_COMMUNITY): Payer: Self-pay | Admitting: Emergency Medicine

## 2013-12-12 ENCOUNTER — Emergency Department (HOSPITAL_COMMUNITY): Payer: Medicare Other

## 2013-12-12 DIAGNOSIS — Z888 Allergy status to other drugs, medicaments and biological substances status: Secondary | ICD-10-CM

## 2013-12-12 DIAGNOSIS — R609 Edema, unspecified: Secondary | ICD-10-CM

## 2013-12-12 DIAGNOSIS — E785 Hyperlipidemia, unspecified: Secondary | ICD-10-CM

## 2013-12-12 DIAGNOSIS — R51 Headache: Secondary | ICD-10-CM

## 2013-12-12 DIAGNOSIS — E538 Deficiency of other specified B group vitamins: Secondary | ICD-10-CM

## 2013-12-12 DIAGNOSIS — E1149 Type 2 diabetes mellitus with other diabetic neurological complication: Secondary | ICD-10-CM | POA: Diagnosis present

## 2013-12-12 DIAGNOSIS — I2510 Atherosclerotic heart disease of native coronary artery without angina pectoris: Principal | ICD-10-CM | POA: Diagnosis present

## 2013-12-12 DIAGNOSIS — E1165 Type 2 diabetes mellitus with hyperglycemia: Secondary | ICD-10-CM

## 2013-12-12 DIAGNOSIS — L309 Dermatitis, unspecified: Secondary | ICD-10-CM

## 2013-12-12 DIAGNOSIS — B9789 Other viral agents as the cause of diseases classified elsewhere: Secondary | ICD-10-CM

## 2013-12-12 DIAGNOSIS — N189 Chronic kidney disease, unspecified: Secondary | ICD-10-CM | POA: Diagnosis present

## 2013-12-12 DIAGNOSIS — I129 Hypertensive chronic kidney disease with stage 1 through stage 4 chronic kidney disease, or unspecified chronic kidney disease: Secondary | ICD-10-CM | POA: Diagnosis present

## 2013-12-12 DIAGNOSIS — E1142 Type 2 diabetes mellitus with diabetic polyneuropathy: Secondary | ICD-10-CM | POA: Diagnosis present

## 2013-12-12 DIAGNOSIS — G589 Mononeuropathy, unspecified: Secondary | ICD-10-CM

## 2013-12-12 DIAGNOSIS — K219 Gastro-esophageal reflux disease without esophagitis: Secondary | ICD-10-CM | POA: Diagnosis present

## 2013-12-12 DIAGNOSIS — E119 Type 2 diabetes mellitus without complications: Secondary | ICD-10-CM | POA: Diagnosis present

## 2013-12-12 DIAGNOSIS — Z7982 Long term (current) use of aspirin: Secondary | ICD-10-CM

## 2013-12-12 DIAGNOSIS — N182 Chronic kidney disease, stage 2 (mild): Secondary | ICD-10-CM

## 2013-12-12 DIAGNOSIS — Z7902 Long term (current) use of antithrombotics/antiplatelets: Secondary | ICD-10-CM

## 2013-12-12 DIAGNOSIS — I160 Hypertensive urgency: Secondary | ICD-10-CM

## 2013-12-12 DIAGNOSIS — Z882 Allergy status to sulfonamides status: Secondary | ICD-10-CM

## 2013-12-12 DIAGNOSIS — Z79899 Other long term (current) drug therapy: Secondary | ICD-10-CM

## 2013-12-12 DIAGNOSIS — D509 Iron deficiency anemia, unspecified: Secondary | ICD-10-CM

## 2013-12-12 DIAGNOSIS — R079 Chest pain, unspecified: Secondary | ICD-10-CM | POA: Diagnosis present

## 2013-12-12 DIAGNOSIS — M899 Disorder of bone, unspecified: Secondary | ICD-10-CM

## 2013-12-12 DIAGNOSIS — I200 Unstable angina: Secondary | ICD-10-CM

## 2013-12-12 DIAGNOSIS — Z794 Long term (current) use of insulin: Secondary | ICD-10-CM

## 2013-12-12 DIAGNOSIS — M949 Disorder of cartilage, unspecified: Secondary | ICD-10-CM

## 2013-12-12 DIAGNOSIS — Z9089 Acquired absence of other organs: Secondary | ICD-10-CM

## 2013-12-12 DIAGNOSIS — K5730 Diverticulosis of large intestine without perforation or abscess without bleeding: Secondary | ICD-10-CM | POA: Diagnosis present

## 2013-12-12 DIAGNOSIS — Z9861 Coronary angioplasty status: Secondary | ICD-10-CM

## 2013-12-12 DIAGNOSIS — D649 Anemia, unspecified: Secondary | ICD-10-CM

## 2013-12-12 DIAGNOSIS — Z8249 Family history of ischemic heart disease and other diseases of the circulatory system: Secondary | ICD-10-CM

## 2013-12-12 DIAGNOSIS — I10 Essential (primary) hypertension: Secondary | ICD-10-CM | POA: Diagnosis present

## 2013-12-12 DIAGNOSIS — J069 Acute upper respiratory infection, unspecified: Secondary | ICD-10-CM

## 2013-12-12 DIAGNOSIS — I209 Angina pectoris, unspecified: Secondary | ICD-10-CM

## 2013-12-12 LAB — BASIC METABOLIC PANEL
ANION GAP: 14 (ref 5–15)
BUN: 32 mg/dL — ABNORMAL HIGH (ref 6–23)
CALCIUM: 9.5 mg/dL (ref 8.4–10.5)
CO2: 21 mEq/L (ref 19–32)
CREATININE: 1.56 mg/dL — AB (ref 0.50–1.10)
Chloride: 102 mEq/L (ref 96–112)
GFR calc non Af Amer: 31 mL/min — ABNORMAL LOW (ref >90)
GFR, EST AFRICAN AMERICAN: 36 mL/min — AB (ref >90)
Glucose, Bld: 193 mg/dL — ABNORMAL HIGH (ref 70–99)
Potassium: 4.4 mEq/L (ref 3.7–5.3)
Sodium: 137 mEq/L (ref 137–147)

## 2013-12-12 LAB — CBC
HEMATOCRIT: 38.5 % (ref 36.0–46.0)
Hemoglobin: 12.7 g/dL (ref 12.0–15.0)
MCH: 30.7 pg (ref 26.0–34.0)
MCHC: 33 g/dL (ref 30.0–36.0)
MCV: 93 fL (ref 78.0–100.0)
PLATELETS: 273 10*3/uL (ref 150–400)
RBC: 4.14 MIL/uL (ref 3.87–5.11)
RDW: 12.6 % (ref 11.5–15.5)
WBC: 10 10*3/uL (ref 4.0–10.5)

## 2013-12-12 LAB — TROPONIN I: Troponin I: <0.3 ng/mL (ref <0.30)

## 2013-12-12 MED ORDER — SIMVASTATIN 40 MG PO TABS
40.0000 mg | ORAL_TABLET | Freq: Every day | ORAL | Status: DC
  Administered 2013-12-13 – 2013-12-14 (×2): 40 mg via ORAL
  Filled 2013-12-12 (×3): qty 1

## 2013-12-12 MED ORDER — NITROGLYCERIN 0.4 MG SL SUBL: 0.4000 mg | SUBLINGUAL_TABLET | SUBLINGUAL | Status: DC | PRN

## 2013-12-12 MED ORDER — INSULIN ASPART 100 UNIT/ML ~~LOC~~ SOLN: 10.0000 [IU] | SUBCUTANEOUS | Status: DC

## 2013-12-12 MED ORDER — LISINOPRIL 2.5 MG PO TABS
2.5000 mg | ORAL_TABLET | Freq: Every day | ORAL | Status: DC
  Administered 2013-12-13 – 2013-12-14 (×2): 2.5 mg via ORAL
  Filled 2013-12-12 (×3): qty 1

## 2013-12-12 MED ORDER — ASPIRIN EC 81 MG PO TBEC: 81.0000 mg | DELAYED_RELEASE_TABLET | Freq: Every day | ORAL | Status: DC

## 2013-12-12 MED ORDER — ASPIRIN 81 MG PO CHEW
324.0000 mg | CHEWABLE_TABLET | ORAL | Status: AC
  Filled 2013-12-12: qty 4

## 2013-12-12 MED ORDER — FENOFIBRATE 54 MG PO TABS
54.0000 mg | ORAL_TABLET | Freq: Every day | ORAL | Status: DC
  Administered 2013-12-13 – 2013-12-14 (×2): 54 mg via ORAL
  Filled 2013-12-12 (×3): qty 1

## 2013-12-12 MED ORDER — INSULIN GLARGINE 100 UNIT/ML ~~LOC~~ SOLN
22.0000 [IU] | Freq: Every day | SUBCUTANEOUS | Status: DC
  Filled 2013-12-12: qty 0.22

## 2013-12-12 MED ORDER — SODIUM CHLORIDE 0.9 % IJ SOLN
3.0000 mL | Freq: Two times a day (BID) | INTRAMUSCULAR | Status: DC
  Administered 2013-12-13 – 2013-12-14 (×2): 3 mL via INTRAVENOUS

## 2013-12-12 MED ORDER — ACETAMINOPHEN 325 MG PO TABS: 650.0000 mg | ORAL_TABLET | ORAL | Status: DC | PRN

## 2013-12-12 MED ORDER — CARVEDILOL 6.25 MG PO TABS
6.2500 mg | ORAL_TABLET | Freq: Two times a day (BID) | ORAL | Status: DC
  Administered 2013-12-13 – 2013-12-14 (×4): 6.25 mg via ORAL
  Filled 2013-12-12 (×7): qty 1

## 2013-12-12 MED ORDER — ONDANSETRON HCL 4 MG/2ML IJ SOLN: 4.0000 mg | Freq: Four times a day (QID) | INTRAMUSCULAR | Status: DC | PRN

## 2013-12-12 MED ORDER — ASPIRIN 81 MG PO CHEW
324.0000 mg | CHEWABLE_TABLET | Freq: Once | ORAL | Status: AC
  Administered 2013-12-12: 324 mg via ORAL
  Filled 2013-12-12: qty 4

## 2013-12-12 MED ORDER — SODIUM CHLORIDE 0.9 % IJ SOLN: 3.0000 mL | INTRAMUSCULAR | Status: DC | PRN

## 2013-12-12 MED ORDER — ENOXAPARIN SODIUM 40 MG/0.4ML ~~LOC~~ SOLN
40.0000 mg | SUBCUTANEOUS | Status: DC
  Administered 2013-12-13: 40 mg via SUBCUTANEOUS
  Filled 2013-12-12: qty 0.4

## 2013-12-12 MED ORDER — ASPIRIN EC 81 MG PO TBEC
81.0000 mg | DELAYED_RELEASE_TABLET | Freq: Every day | ORAL | Status: DC
  Administered 2013-12-13 – 2013-12-14 (×2): 81 mg via ORAL
  Filled 2013-12-12 (×3): qty 1

## 2013-12-12 MED ORDER — ASPIRIN 300 MG RE SUPP: 300.0000 mg | RECTAL | Status: AC

## 2013-12-12 MED ORDER — SODIUM CHLORIDE 0.9 % IV SOLN
250.0000 mL | INTRAVENOUS | Status: DC | PRN
Start: 2013-12-12 — End: 2013-12-15

## 2013-12-12 NOTE — ED Notes (Signed)
Admitting at bedside 

## 2013-12-12 NOTE — ED Provider Notes (Signed)
CSN: [id number]     Arrival date & time 12/12/13  2130 History   First MD Initiated Contact with Patient 12/12/13 2203     Chief Complaint  Patient presents with  . Chest Pain   [name] is a 80 yo caucasian F w/PMH of CAD s/p stent (Last cath was in 2007 w/moderate 3 vessel disease, patent LAD stent and severe stenosis in the small caliber diagonal branches), IDDM w/peripheral neuropathy, HTN, and HLD, who presents with chest pain. Patient was talking on the phone approximately 45 minutes ago when she felt a pressure type sensation substernally that radiated to the left side of her chest. Patient says it was an 8/10 in severity. Was accompanied by nausea with a salty taste in her mouth. Also accompanied by diaphoresis. Denies any shortness of breath. Pain went away on its own within 5 minutes. Felt like she was going to faint. Pt took 2 tums.   She denies fever, chills, abd pain, vomiting, diarrhea, constipation, hematemesis, dysuria, hematuria, sick contacts, or recent travel.   (Consider location/radiation/quality/duration/timing/severity/associated sxs/prior Treatment) Patient is a 80 y.o. female presenting with chest pain.  Chest Pain Pain location:  Substernal area Pain quality: aching and pressure   Radiates to: left chest. Pain radiates to the back: no   Pain severity:  Severe Onset quality:  Sudden Duration:  5 minutes Timing:  Constant Progression:  Resolved Chronicity:  New Context: at rest   Associated symptoms: diaphoresis, nausea and weakness   Associated symptoms: no abdominal pain, no cough, no dizziness, no fever, no headache, no heartburn, no palpitations, no shortness of breath and not vomiting   Risk factors: coronary artery disease, diabetes mellitus, high cholesterol and hypertension   Risk factors: no prior DVT/PE and no smoking     Past Medical History  Diagnosis Date  . Hypertension   . Hyperlipidemia   . GERD (gastroesophageal reflux disease)    . Anemia   . CAD (coronary artery disease)     status Taxus drug-eluding stent to the LAD December 2006  . Edema     lower extremity with normal BNP  . Encounter for long-term (current) use of medications   . Hyperkalemia   . Glossitis   . Leg pain, left   . Routine general medical examination at a health care facility   . Chronic renal disease     mild  . Pyuria   . Anemia, iron deficiency   . UTI (urinary tract infection)   . Pain in foot     bilateral  . Osteopenia   . Diverticulosis of colon   . Diabetes mellitus type II   . Polyneuropathy   . Dermatitis 11/23/2012   Past Surgical History  Procedure Laterality Date  . Cholecystectomy    . Total abdominal hysterectomy  1969    no BSO  . Ptca      stent to LAD in 03/2005, recatheterization inJanuarry 2007 for recurrent chest pain  that showed a patent LAD stent with normal LV function  . Esophagogastroduodenoscopy  04/15/2006  . Cardiovascular stress test  02/24/2006  . Electrocardiogram  06/18/2006   Family History  Problem Relation Age of Onset  . Heart disease Mother   . Heart disease Sister    History  Substance Use Topics  . Smoking status: Never Smoker   . Smokeless tobacco: Never Used  . Alcohol Use: No   OB History   Grav Para Term Preterm Abortions TAB SAB Ect Mult Living                   Review of Systems  Constitutional: Positive for diaphoresis. Negative for fever and chills.  Respiratory: Negative for cough and shortness of breath.   Cardiovascular: Positive for chest pain. Negative for palpitations and leg swelling.  Gastrointestinal: Positive for nausea. Negative for heartburn, vomiting, abdominal pain, diarrhea, constipation and abdominal distention.  Genitourinary: Negative for dysuria, frequency, flank pain and decreased urine volume.  Neurological: Positive for weakness. Negative for dizziness, speech difficulty, light-headedness and headaches.  All other systems reviewed and are  negative.     Allergies  Pioglitazone and Sulfonamide derivatives  Home Medications   Prior to Admission medications   Medication Sig Start Date End Date Taking? Authorizing Provider  aspirin EC 81 MG tablet Take 81 mg by mouth daily.   Yes Historical Provider, MD  calcium carbonate (TUMS - DOSED IN MG ELEMENTAL CALCIUM) 500 MG chewable tablet Chew 1 tablet by mouth 3 (three) times daily as needed. For acid reflux    Yes Historical Provider, MD  carvedilol (COREG) 6.25 MG tablet Take 1 tablet (6.25 mg total) by mouth 2 (two) times daily with a meal. 01/25/13  Yes Mosie Lukes, MD  fenofibrate 54 MG tablet Take 1 tablet (54 mg total) by mouth daily. 11/13/12  Yes Mosie Lukes, MD  insulin aspart (NOVOLOG) 100 UNIT/ML injection Inject 10-12 Units into the skin See admin instructions. Take 10 units before breakfast, 12 units before lunch and 10 units before dinner.   Yes Historical Provider, MD  insulin glargine (LANTUS) 100 UNIT/ML injection Inject 22-26 Units into the skin at bedtime. Bedtime range dosed per blood sugar reading.   Yes Historical Provider, MD  lisinopril (PRINIVIL,ZESTRIL) 2.5 MG tablet Take 2.5 mg by mouth daily.   Yes Historical Provider, MD  lovastatin (MEVACOR) 40 MG tablet Take 40 mg by mouth at bedtime.   Yes Historical Provider, MD  Blood Glucose Monitoring Suppl (ONE TOUCH ULTRA MINI) W/DEVICE KIT Use to check blood sugar twice a day.  DX 250.00 03/24/13   Mosie Lukes, MD  glucose blood test strip ONE TOUCH ULTRA TEST STRIP. Use as instructed to check blood sugar twice a day.  Dx 250.00 03/24/13   Mosie Lukes, MD  Insulin Pen Needle 31G X 8 MM MISC 1 each by Does not apply route 2 (two) times daily before a meal. 06/27/11   Burnice Logan, MD  nitroGLYCERIN (NITROSTAT) 0.4 MG SL tablet Place 1 tablet (0.4 mg total) under the tongue every 5 (five) minutes as needed. For chest pain 03/23/12   Burnell Blanks, MD   BP 189/76  Pulse 82  Temp(Src) 97.9 F  (36.6 C)  Resp 18  Ht 5' 7" (1.702 m)  Wt 176 lb (79.833 kg)  BMI 27.56 kg/m2  SpO2 97% Physical Exam  Nursing note and vitals reviewed. Constitutional: She is oriented to person, place, and time. She appears well-developed and well-nourished. No distress.  HENT:  Head: Normocephalic and atraumatic.  Cardiovascular: Normal rate, regular rhythm, normal heart sounds and intact distal pulses.  Exam reveals no gallop and no friction rub.   No murmur heard. Pulmonary/Chest: Effort normal and breath sounds normal. No respiratory distress. She has no wheezes. She has no rales. She exhibits no tenderness.  Abdominal: Soft. Bowel sounds are normal. She exhibits no distension and no mass. There is no tenderness. There is no rebound and no guarding.  Musculoskeletal: She exhibits no edema and no tenderness.  Lymphadenopathy:    She has no cervical adenopathy.  Neurological: She is alert and oriented to person, place, and time.  Skin: Skin is warm and dry. She is not diaphoretic.    ED Course  Procedures (including critical care time) Labs Review Labs Reviewed  BASIC METABOLIC PANEL - Abnormal; Notable for the following:    Glucose, Bld 193 (*)    BUN 32 (*)    Creatinine, Ser 1.56 (*)    GFR calc non Af Amer 31 (*)    GFR calc Af Amer 36 (*)    All other components within normal limits  CBC  TROPONIN I  TROPONIN I  TROPONIN I  TROPONIN I  HEMOGLOBIN A1C  BASIC METABOLIC PANEL  CBC  PROTIME-INR    Imaging Review Dg Chest 2 View  12/12/2013   CLINICAL DATA:  Chest pain  EXAM: CHEST  2 VIEW  COMPARISON:  01/06/2011  FINDINGS: Normal heart size and mediastinal contours. Coronary stent noted, likely in the left circulation. There is no edema, consolidation, effusion, or pneumothorax. Minimal scarring present at the peripheral left base. Cholecystectomy changes.  IMPRESSION: No evidence of active disease.   Electronically Signed   By: Jonathan  Watts M.D.   On: 12/12/2013 22:25      EKG Interpretation   Date/Time:  Sunday December 12 2013 21:37:30 EDT Ventricular Rate:  82 PR Interval:  132 QRS Duration: 72 QT Interval:  368 QTC Calculation: 429 R Axis:   17 Text Interpretation:  Normal sinus rhythm Low voltage QRS Cannot rule out  Anterior infarct , age undetermined Abnormal ECG No significant change  since last tracing Confirmed by PICKERING  MD, NATHAN ([id number]) on 12/12/2013  10:19:38 PM      MDM   80 yo caucasian F w/CP. Please see hpi for details. On exam, pt in NAD, AFVSS. Incompletely resolved by this time. 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 Will obtain CBC, BMP, troponin, and chest x-ray. Given 324 aspirin.  CXR shows no acute cardiopulmonary abnormalities. CBC, BMP wnl. Troponin below assay.   W/cardiac history, pt is high risk chest pain. Will be admitted to hospitalist for continuing monitoring and observation. Please see their note for further details regarding the remainder of her hospital course. Throughout her time in the ED, pt remained stable.    Final diagnoses:  Chest pain at rest    Pt was seen under the supervision of Dr. Pickering.      , MD 12/12/13 2358 

## 2013-12-12 NOTE — ED Notes (Signed)
Patient was talking on the phone and started with left sided chest pain that radiated to her left arm  Got a salty taste on her mouth and became nauseated  Has history of stent

## 2013-12-13 ENCOUNTER — Observation Stay (HOSPITAL_COMMUNITY): Payer: Medicare Other

## 2013-12-13 DIAGNOSIS — I10 Essential (primary) hypertension: Secondary | ICD-10-CM

## 2013-12-13 DIAGNOSIS — E119 Type 2 diabetes mellitus without complications: Secondary | ICD-10-CM

## 2013-12-13 DIAGNOSIS — R0789 Other chest pain: Secondary | ICD-10-CM

## 2013-12-13 LAB — HEMOGLOBIN A1C
HEMOGLOBIN A1C: 7.1 % — AB (ref <5.7)
Mean Plasma Glucose: 157 mg/dL — ABNORMAL HIGH (ref <117)

## 2013-12-13 LAB — GLUCOSE, CAPILLARY
Glucose-Capillary: 128 mg/dL — ABNORMAL HIGH (ref 70–99)
Glucose-Capillary: 165 mg/dL — ABNORMAL HIGH (ref 70–99)
Glucose-Capillary: 273 mg/dL — ABNORMAL HIGH (ref 70–99)
Glucose-Capillary: 163 mg/dL — ABNORMAL HIGH (ref 70–99)

## 2013-12-13 LAB — BASIC METABOLIC PANEL
Anion gap: 14 (ref 5–15)
BUN: 32 mg/dL — ABNORMAL HIGH (ref 6–23)
CALCIUM: 9.6 mg/dL (ref 8.4–10.5)
CO2: 22 mEq/L (ref 19–32)
Chloride: 105 mEq/L (ref 96–112)
Creatinine, Ser: 1.6 mg/dL — ABNORMAL HIGH (ref 0.50–1.10)
GFR, EST AFRICAN AMERICAN: 34 mL/min — AB (ref >90)
GFR, EST NON AFRICAN AMERICAN: 30 mL/min — AB (ref >90)
GLUCOSE: 134 mg/dL — AB (ref 70–99)
Potassium: 4.4 mEq/L (ref 3.7–5.3)
Sodium: 141 mEq/L (ref 137–147)

## 2013-12-13 LAB — PROTIME-INR
INR: 0.99 (ref 0.00–1.49)
Prothrombin Time: 13.1 seconds (ref 11.6–15.2)

## 2013-12-13 LAB — CBC
HCT: 35.1 % — ABNORMAL LOW (ref 36.0–46.0)
Hemoglobin: 11.6 g/dL — ABNORMAL LOW (ref 12.0–15.0)
MCH: 30.7 pg (ref 26.0–34.0)
MCHC: 33 g/dL (ref 30.0–36.0)
MCV: 92.9 fL (ref 78.0–100.0)
PLATELETS: 244 10*3/uL (ref 150–400)
RBC: 3.78 MIL/uL — ABNORMAL LOW (ref 3.87–5.11)
RDW: 12.5 % (ref 11.5–15.5)
WBC: 6.8 10*3/uL (ref 4.0–10.5)

## 2013-12-13 LAB — TROPONIN I
Troponin I: <0.3 ng/mL (ref <0.30)
Troponin I: <0.3 ng/mL (ref <0.30)

## 2013-12-13 MED ORDER — NITROGLYCERIN 0.4 MG/SPRAY TL SOLN: 1.0000 | Status: DC | PRN

## 2013-12-13 MED ORDER — REGADENOSON 0.4 MG/5ML IV SOLN
INTRAVENOUS | Status: AC
  Administered 2013-12-13: 0.4 mg via INTRAVENOUS
  Filled 2013-12-13: qty 5

## 2013-12-13 MED ORDER — REGADENOSON 0.4 MG/5ML IV SOLN
0.4000 mg | Freq: Once | INTRAVENOUS | Status: AC
  Administered 2013-12-13: 0.4 mg via INTRAVENOUS
  Filled 2013-12-13: qty 5

## 2013-12-13 MED ORDER — TECHNETIUM TC 99M SESTAMIBI GENERIC - CARDIOLITE
10.0000 | Freq: Once | INTRAVENOUS | Status: AC | PRN
  Administered 2013-12-13: 10 via INTRAVENOUS

## 2013-12-13 MED ORDER — SODIUM CHLORIDE 0.9 % IV SOLN
1.0000 mL/kg/h | INTRAVENOUS | Status: DC
  Administered 2013-12-13 – 2013-12-14 (×2): 1 mL/kg/h via INTRAVENOUS

## 2013-12-13 MED ORDER — INSULIN GLARGINE 100 UNIT/ML ~~LOC~~ SOLN
11.0000 [IU] | Freq: Once | SUBCUTANEOUS | Status: AC
  Administered 2013-12-13: 11 [IU] via SUBCUTANEOUS
  Filled 2013-12-13: qty 0.11

## 2013-12-13 MED ORDER — PANTOPRAZOLE SODIUM 40 MG PO TBEC
40.0000 mg | DELAYED_RELEASE_TABLET | Freq: Every day | ORAL | Status: DC
  Administered 2013-12-13 – 2013-12-15 (×3): 40 mg via ORAL
  Filled 2013-12-13 (×3): qty 1

## 2013-12-13 MED ORDER — INSULIN ASPART 100 UNIT/ML ~~LOC~~ SOLN
12.0000 [IU] | Freq: Every day | SUBCUTANEOUS | Status: DC
Start: 2013-12-13 — End: 2013-12-15

## 2013-12-13 MED ORDER — INSULIN GLARGINE 100 UNIT/ML ~~LOC~~ SOLN
22.0000 [IU] | Freq: Every day | SUBCUTANEOUS | Status: DC
  Administered 2013-12-14: 22 [IU] via SUBCUTANEOUS
  Filled 2013-12-13 (×2): qty 0.22

## 2013-12-13 MED ORDER — PANTOPRAZOLE SODIUM 40 MG PO TBEC: 40.0000 mg | DELAYED_RELEASE_TABLET | Freq: Every day | ORAL | Status: DC

## 2013-12-13 MED ORDER — INSULIN ASPART 100 UNIT/ML ~~LOC~~ SOLN
10.0000 [IU] | Freq: Two times a day (BID) | SUBCUTANEOUS | Status: DC
  Administered 2013-12-13 – 2013-12-14 (×2): 10 [IU] via SUBCUTANEOUS

## 2013-12-13 MED ORDER — TECHNETIUM TC 99M SESTAMIBI GENERIC - CARDIOLITE
30.0000 | Freq: Once | INTRAVENOUS | Status: AC | PRN
  Administered 2013-12-13: 30 via INTRAVENOUS

## 2013-12-13 NOTE — H&P (Signed)
[facility] History and Physical  [name] TDV:[id number] DOB: 1933-09-26 DOA: 12/12/2013  PCP: Penni Homans, MD   Chief Complaint: Chest pain  HPI: [name] is a 80 y.o. pleasant female with PMH of CAD s/p stenting in December 2006 (Last cath was in 2007 w/moderate 3 vessel disease, patent LAD stent and severe stenosis in the small caliber diagonal branches), IDDM with severe peripheral neuropathy, HTN, and HLD, who presents to the ED with chest pain. Patient was talking on the phone with her niece, whose son had a domestic accident with fractured skull, when she felt a pressure type sensation substernally that radiated to the left side of her chest. Patient says it was an 8/10 in severity. Was accompanied by nausea with a salty taste in her mouth. Also accompanied by diaphoresis. She took her blood pressure decreased to 200+ over 80. Denies any shortness of breath. Pain went away on its own within 5 minutes. Felt like she was going to faint. Pt took 2 tums. Right now patient has 0/10 pain, this admission is appropriate cardiac workup since patient is known to have CAD and has not had a cath or stress test in a long time. She denies fever, chills, abd pain, vomiting, diarrhea, constipation, hematemesis, dysuria, hematuria, sick contacts, or recent travel.   General: The patient denies anorexia, fever, weight loss Cardiac: As above Respiratory: Denies cough, shortness of breath, wheezing GI: Denies severe indigestion/heartburn, abdominal pain, nausea, vomiting, diarrhea and constipation GU: Denies hematuria, incontinence, dysuria  Musculoskeletal: Denies arthritis  Skin: Denies suspicious skin lesions Neurologic: Denies focal weakness or numbness, change in vision  Past Medical History  Diagnosis Date  . Hypertension   . Hyperlipidemia   . GERD (gastroesophageal reflux disease)   . Anemia   . CAD (coronary artery disease)     status Taxus drug-eluding stent to the  LAD December 2006  . Edema     lower extremity with normal BNP  . Encounter for long-term (current) use of medications   . Hyperkalemia   . Glossitis   . Leg pain, left   . Routine general medical examination at a health care facility   . Chronic renal disease     mild  . Pyuria   . Anemia, iron deficiency   . UTI (urinary tract infection)   . Pain in foot     bilateral  . Osteopenia   . Diverticulosis of colon   . Diabetes mellitus type II   . Polyneuropathy   . Dermatitis 11/23/2012    Past Surgical History  Procedure Laterality Date  . Cholecystectomy    . Total abdominal hysterectomy  1969    no BSO  . Ptca      stent to LAD in 03/2005, recatheterization inJanuarry 2007 for recurrent chest pain  that showed a patent LAD stent with normal LV function  . Esophagogastroduodenoscopy  04/15/2006  . Cardiovascular stress test  02/24/2006  . Electrocardiogram  06/18/2006    Social History: She does not smoke cigarette, she does not drink alcohol. Lives at home with husband ADLs intact  Allergies  Allergen Reactions  . Pioglitazone     REACTION: Edema Actos.  . Sulfonamide Derivatives     hives    Family History  Problem Relation Age of Onset  . Heart disease Mother   . Heart disease Sister       Prior to Admission medications   Medication Sig Start Date End Date Taking? Authorizing Provider  [facility] History and Physical  [name] TDV:[id number] DOB: 1933-09-26 DOA: 12/12/2013  PCP: Penni Homans, MD   Chief Complaint: Chest pain  HPI: [name] is a 80 y.o. pleasant female with PMH of CAD s/p stenting in December 2006 (Last cath was in 2007 w/moderate 3 vessel disease, patent LAD stent and severe stenosis in the small caliber diagonal branches), IDDM with severe peripheral neuropathy, HTN, and HLD, who presents to the ED with chest pain. Patient was talking on the phone with her niece, whose son had a domestic accident with fractured skull, when she felt a pressure type sensation substernally that radiated to the left side of her chest. Patient says it was an 8/10 in severity. Was accompanied by nausea with a salty taste in her mouth. Also accompanied by diaphoresis. She took her blood pressure decreased to 200+ over 80. Denies any shortness of breath. Pain went away on its own within 5 minutes. Felt like she was going to faint. Pt took 2 tums. Right now patient has 0/10 pain, this admission is appropriate cardiac workup since patient is known to have CAD and has not had a cath or stress test in a long time. She denies fever, chills, abd pain, vomiting, diarrhea, constipation, hematemesis, dysuria, hematuria, sick contacts, or recent travel.   General: The patient denies anorexia, fever, weight loss Cardiac: As above Respiratory: Denies cough, shortness of breath, wheezing GI: Denies severe indigestion/heartburn, abdominal pain, nausea, vomiting, diarrhea and constipation GU: Denies hematuria, incontinence, dysuria  Musculoskeletal: Denies arthritis  Skin: Denies suspicious skin lesions Neurologic: Denies focal weakness or numbness, change in vision  Past Medical History  Diagnosis Date  . Hypertension   . Hyperlipidemia   . GERD (gastroesophageal reflux disease)   . Anemia   . CAD (coronary artery disease)     status Taxus drug-eluding stent to the  LAD December 2006  . Edema     lower extremity with normal BNP  . Encounter for long-term (current) use of medications   . Hyperkalemia   . Glossitis   . Leg pain, left   . Routine general medical examination at a health care facility   . Chronic renal disease     mild  . Pyuria   . Anemia, iron deficiency   . UTI (urinary tract infection)   . Pain in foot     bilateral  . Osteopenia   . Diverticulosis of colon   . Diabetes mellitus type II   . Polyneuropathy   . Dermatitis 11/23/2012    Past Surgical History  Procedure Laterality Date  . Cholecystectomy    . Total abdominal hysterectomy  1969    no BSO  . Ptca      stent to LAD in 03/2005, recatheterization inJanuarry 2007 for recurrent chest pain  that showed a patent LAD stent with normal LV function  . Esophagogastroduodenoscopy  04/15/2006  . Cardiovascular stress test  02/24/2006  . Electrocardiogram  06/18/2006    Social History: She does not smoke cigarette, she does not drink alcohol. Lives at home with husband ADLs intact  Allergies  Allergen Reactions  . Pioglitazone     REACTION: Edema Actos.  . Sulfonamide Derivatives     hives    Family History  Problem Relation Age of Onset  . Heart disease Mother   . Heart disease Sister       Prior to Admission medications   Medication Sig Start Date End Date Taking? Authorizing Provider  [facility] History and Physical  [name] TDV:[id number] DOB: 1933-09-26 DOA: 12/12/2013  PCP: Penni Homans, MD   Chief Complaint: Chest pain  HPI: [name] is a 80 y.o. pleasant female with PMH of CAD s/p stenting in December 2006 (Last cath was in 2007 w/moderate 3 vessel disease, patent LAD stent and severe stenosis in the small caliber diagonal branches), IDDM with severe peripheral neuropathy, HTN, and HLD, who presents to the ED with chest pain. Patient was talking on the phone with her niece, whose son had a domestic accident with fractured skull, when she felt a pressure type sensation substernally that radiated to the left side of her chest. Patient says it was an 8/10 in severity. Was accompanied by nausea with a salty taste in her mouth. Also accompanied by diaphoresis. She took her blood pressure decreased to 200+ over 80. Denies any shortness of breath. Pain went away on its own within 5 minutes. Felt like she was going to faint. Pt took 2 tums. Right now patient has 0/10 pain, this admission is appropriate cardiac workup since patient is known to have CAD and has not had a cath or stress test in a long time. She denies fever, chills, abd pain, vomiting, diarrhea, constipation, hematemesis, dysuria, hematuria, sick contacts, or recent travel.   General: The patient denies anorexia, fever, weight loss Cardiac: As above Respiratory: Denies cough, shortness of breath, wheezing GI: Denies severe indigestion/heartburn, abdominal pain, nausea, vomiting, diarrhea and constipation GU: Denies hematuria, incontinence, dysuria  Musculoskeletal: Denies arthritis  Skin: Denies suspicious skin lesions Neurologic: Denies focal weakness or numbness, change in vision  Past Medical History  Diagnosis Date  . Hypertension   . Hyperlipidemia   . GERD (gastroesophageal reflux disease)   . Anemia   . CAD (coronary artery disease)     status Taxus drug-eluding stent to the  LAD December 2006  . Edema     lower extremity with normal BNP  . Encounter for long-term (current) use of medications   . Hyperkalemia   . Glossitis   . Leg pain, left   . Routine general medical examination at a health care facility   . Chronic renal disease     mild  . Pyuria   . Anemia, iron deficiency   . UTI (urinary tract infection)   . Pain in foot     bilateral  . Osteopenia   . Diverticulosis of colon   . Diabetes mellitus type II   . Polyneuropathy   . Dermatitis 11/23/2012    Past Surgical History  Procedure Laterality Date  . Cholecystectomy    . Total abdominal hysterectomy  1969    no BSO  . Ptca      stent to LAD in 03/2005, recatheterization inJanuarry 2007 for recurrent chest pain  that showed a patent LAD stent with normal LV function  . Esophagogastroduodenoscopy  04/15/2006  . Cardiovascular stress test  02/24/2006  . Electrocardiogram  06/18/2006    Social History: She does not smoke cigarette, she does not drink alcohol. Lives at home with husband ADLs intact  Allergies  Allergen Reactions  . Pioglitazone     REACTION: Edema Actos.  . Sulfonamide Derivatives     hives    Family History  Problem Relation Age of Onset  . Heart disease Mother   . Heart disease Sister       Prior to Admission medications   Medication Sig Start Date End Date Taking? Authorizing Provider

## 2013-12-13 NOTE — Progress Notes (Signed)
Lexiscan nuc completed without complication. Cobe Viney PA-C  

## 2013-12-13 NOTE — Consult Note (Signed)
CONSULTATION NOTE  Reason for Consult: Chest pain  Requesting Physician: Dr. Wynelle Cleveland  Cardiologist: Dr. Angelena Form  HPI: This is a 80 y.o. female with a past medical history significant for CAD status post previous Taxus LAD stent Dec 2006, HTN, HLD, neuropathy and diabetes who is here today for cardiac follow up. She has been followed in the past by Dr. Haroldine Laws. Last cath January 2007 with moderate three vessel CAD, patent LAD stent and severe stenosis in the small caliber diagonal branches. She has done well with medical therapy since then. Yesterday evening, she had acute onset chest pain while talking on the phone. It was burning and pressure in the center of her chest and went to her neck and left shoulder. There was a "salty" taste in her mouth and she was nauseated. She did take 2 tums and eventually her symptoms subsided, but she was brought to the ER given her cardiac history.  Cardiac cath January 2007:  Left main was normal.  LAD was a moderate-sized vessel. It gave off a moderate-sized first  diagonal, tiny second diagonal and a small third diagonal. There was  evidence of a previously placed stent in the midsection of the LAD, which  overlapped the second diagonal. This was widely patent. There was a 30%  stenosis in the LAD just after the stent. In the first diagonal, there was a  70-80% tubular stenosis which was unchanged from previous. In the tiny  second diagonal, there was a 99% ostial stenosis.  Left circumflex was a moderate-sized vessel made up primarily of a large  branching OM-1. There was a 40% lesion in the circumflex leading into the  ostium of the OM-1.  Right coronary artery was a moderate-sized dominant vessel gave off a  moderate-sized RV branch, a large PDA, small PL and a large second PL. There  was a 30% proximal stenosis in the RCA and a 30-40% midsection of the  takeoff of the RV branch. There was a 40% distally prior to the takeoff of  the PDA  and a 30% very distal between the two PL's. In the RV branch, there  was a 95% proximal lesion. In the PDA, there was a 60% very distal lesion.  LV gram done in the RAO projection showed apparently a normal EF; however,  this was hard to tell given the significant amount of ectopy. There was no  mitral regurgitation.   ASSESSMENT:  1. The left anterior descending artery stent was widely patent with  residual coronary artery disease and a moderate-sized first diagonal and  tiny second diagonal. There has been no change in this since December of  2006.  2. Nonobstructive coronary disease in the right coronary artery and left  circumflex with high-grade lesion in a small right ventricular branch.   PMHx:  Past Medical History  Diagnosis Date  . Hypertension   . Hyperlipidemia   . GERD (gastroesophageal reflux disease)   . Anemia   . CAD (coronary artery disease)     status Taxus drug-eluding stent to the LAD December 2006  . Edema     lower extremity with normal BNP  . Encounter for long-term (current) use of medications   . Hyperkalemia   . Glossitis   . Leg pain, left   . Routine general medical examination at a health care facility   . Chronic renal disease     mild  . Pyuria   . Anemia, iron deficiency   . UTI (urinary  tract infection)   . Pain in foot     bilateral  . Osteopenia   . Diverticulosis of colon   . Diabetes mellitus type II   . Polyneuropathy   . Dermatitis 11/23/2012   Past Surgical History  Procedure Laterality Date  . Cholecystectomy    . Total abdominal hysterectomy  1969    no BSO  . Ptca      stent to LAD in 03/2005, recatheterization inJanuarry 2007 for recurrent chest pain  that showed a patent LAD stent with normal LV function  . Esophagogastroduodenoscopy  04/15/2006  . Cardiovascular stress test  02/24/2006  . Electrocardiogram  06/18/2006    FAMHx: Family History  Problem Relation Age of Onset  . Heart disease Mother   . Heart  disease Sister     SOCHx:  reports that she has never smoked. She has never used smokeless tobacco. She reports that she does not drink alcohol or use illicit drugs.  ALLERGIES: Allergies  Allergen Reactions  . Pioglitazone     REACTION: Edema Actos.  . Sulfonamide Derivatives     hives    ROS: A comprehensive review of systems was negative except for: Cardiovascular: positive for chest pressure/discomfort  HOME MEDICATIONS: Prescriptions prior to admission  Medication Sig Dispense Refill  . aspirin EC 81 MG tablet Take 81 mg by mouth daily.      . calcium carbonate (TUMS - DOSED IN MG ELEMENTAL CALCIUM) 500 MG chewable tablet Chew 1 tablet by mouth 3 (three) times daily as needed. For acid reflux       . carvedilol (COREG) 6.25 MG tablet Take 1 tablet (6.25 mg total) by mouth 2 (two) times daily with a meal.  180 tablet  1  . fenofibrate 54 MG tablet Take 1 tablet (54 mg total) by mouth daily.  90 tablet  1  . insulin aspart (NOVOLOG) 100 UNIT/ML injection Inject 10-12 Units into the skin See admin instructions. Take 10 units before breakfast, 12 units before lunch and 10 units before dinner.      . insulin glargine (LANTUS) 100 UNIT/ML injection Inject 22-26 Units into the skin at bedtime. Bedtime range dosed per blood sugar reading.      Marland Kitchen lisinopril (PRINIVIL,ZESTRIL) 2.5 MG tablet Take 2.5 mg by mouth daily.      Marland Kitchen lovastatin (MEVACOR) 40 MG tablet Take 40 mg by mouth at bedtime.      . Blood Glucose Monitoring Suppl (ONE TOUCH ULTRA MINI) W/DEVICE KIT Use to check blood sugar twice a day.  DX 250.00  1 each  0  . glucose blood test strip ONE TOUCH ULTRA TEST STRIP. Use as instructed to check blood sugar twice a day.  Dx 250.00      . Insulin Pen Needle 31G X 8 MM MISC 1 each by Does not apply route 2 (two) times daily before a meal.  100 each  1  . nitroGLYCERIN (NITROSTAT) 0.4 MG SL tablet Place 1 tablet (0.4 mg total) under the tongue every 5 (five) minutes as needed. For  chest pain  25 tablet  6    HOSPITAL MEDICATIONS: Scheduled: . aspirin  324 mg Oral NOW   Or  . aspirin  300 mg Rectal NOW  . aspirin EC  81 mg Oral Daily  . carvedilol  6.25 mg Oral BID WC  . enoxaparin (LOVENOX) injection  40 mg Subcutaneous Q24H  . fenofibrate  54 mg Oral Daily  . insulin aspart  10 Units  Subcutaneous BID AC  . insulin aspart  12 Units Subcutaneous QAC lunch  . insulin glargine  22 Units Subcutaneous QHS  . lisinopril  2.5 mg Oral Daily  . simvastatin  40 mg Oral q1800  . sodium chloride  3 mL Intravenous Q12H    VITALS: Blood pressure 135/48, pulse 64, temperature 98.4 F (36.9 C), temperature source Oral, resp. rate 15, height 5' 7"  (1.702 m), weight 179 lb 1.6 oz (81.239 kg), SpO2 98.00%.  PHYSICAL EXAM: General appearance: alert and no distress Neck: no carotid bruit and no JVD Lungs: clear to auscultation bilaterally Heart: regular rate and rhythm, S1, S2 normal, no murmur, click, rub or gallop Abdomen: soft, non-tender; bowel sounds normal; no masses,  no organomegaly Extremities: extremities normal, atraumatic, no cyanosis or edema Pulses: 2+ and symmetric Skin: Skin color, texture, turgor normal. No rashes or lesions Neurologic: Grossly normal Psych: Normal  LABS: Results for orders placed during the hospital encounter of 12/12/13 (from the past 48 hour(s))  CBC     Status: None   Collection Time    12/12/13  9:59 PM      Result Value Ref Range   WBC 10.0  4.0 - 10.5 K/uL   RBC 4.14  3.87 - 5.11 MIL/uL   Hemoglobin 12.7  12.0 - 15.0 g/dL   HCT 38.5  36.0 - 46.0 %   MCV 93.0  78.0 - 100.0 fL   MCH 30.7  26.0 - 34.0 pg   MCHC 33.0  30.0 - 36.0 g/dL   RDW 12.6  11.5 - 15.5 %   Platelets 273  150 - 400 K/uL  BASIC METABOLIC PANEL     Status: Abnormal   Collection Time    12/12/13  9:59 PM      Result Value Ref Range   Sodium 137  137 - 147 mEq/L   Potassium 4.4  3.7 - 5.3 mEq/L   Chloride 102  96 - 112 mEq/L   CO2 21  19 - 32 mEq/L    Glucose, Bld 193 (*) 70 - 99 mg/dL   BUN 32 (*) 6 - 23 mg/dL   Creatinine, Ser 1.56 (*) 0.50 - 1.10 mg/dL   Calcium 9.5  8.4 - 10.5 mg/dL   GFR calc non Af Amer 31 (*) >90 mL/min   GFR calc Af Amer 36 (*) >90 mL/min   Comment: (NOTE)     The eGFR has been calculated using the CKD EPI equation.     This calculation has not been validated in all clinical situations.     eGFR's persistently <90 mL/min signify possible Chronic Kidney     Disease.   Anion gap 14  5 - 15  TROPONIN I     Status: None   Collection Time    12/12/13  9:59 PM      Result Value Ref Range   Troponin I <0.30  <0.30 ng/mL   Comment:            Due to the release kinetics of cTnI,     a negative result within the first hours     of the onset of symptoms does not rule out     myocardial infarction with certainty.     If myocardial infarction is still suspected,     repeat the test at appropriate intervals.  TROPONIN I     Status: None   Collection Time    12/13/13 12:01 AM      Result Value  Ref Range   Troponin I <0.30  <0.30 ng/mL   Comment:            Due to the release kinetics of cTnI,     a negative result within the first hours     of the onset of symptoms does not rule out     myocardial infarction with certainty.     If myocardial infarction is still suspected,     repeat the test at appropriate intervals.  GLUCOSE, CAPILLARY     Status: Abnormal   Collection Time    12/13/13  1:10 AM      Result Value Ref Range   Glucose-Capillary 128 (*) 70 - 99 mg/dL  TROPONIN I     Status: None   Collection Time    12/13/13  6:32 AM      Result Value Ref Range   Troponin I <0.30  <0.30 ng/mL   Comment:            Due to the release kinetics of cTnI,     a negative result within the first hours     of the onset of symptoms does not rule out     myocardial infarction with certainty.     If myocardial infarction is still suspected,     repeat the test at appropriate intervals.  BASIC METABOLIC PANEL      Status: Abnormal   Collection Time    12/13/13  6:32 AM      Result Value Ref Range   Sodium 141  137 - 147 mEq/L   Potassium 4.4  3.7 - 5.3 mEq/L   Chloride 105  96 - 112 mEq/L   CO2 22  19 - 32 mEq/L   Glucose, Bld 134 (*) 70 - 99 mg/dL   BUN 32 (*) 6 - 23 mg/dL   Creatinine, Ser 1.60 (*) 0.50 - 1.10 mg/dL   Calcium 9.6  8.4 - 10.5 mg/dL   GFR calc non Af Amer 30 (*) >90 mL/min   GFR calc Af Amer 34 (*) >90 mL/min   Comment: (NOTE)     The eGFR has been calculated using the CKD EPI equation.     This calculation has not been validated in all clinical situations.     eGFR's persistently <90 mL/min signify possible Chronic Kidney     Disease.   Anion gap 14  5 - 15  CBC     Status: Abnormal   Collection Time    12/13/13  6:32 AM      Result Value Ref Range   WBC 6.8  4.0 - 10.5 K/uL   RBC 3.78 (*) 3.87 - 5.11 MIL/uL   Hemoglobin 11.6 (*) 12.0 - 15.0 g/dL   HCT 35.1 (*) 36.0 - 46.0 %   MCV 92.9  78.0 - 100.0 fL   MCH 30.7  26.0 - 34.0 pg   MCHC 33.0  30.0 - 36.0 g/dL   RDW 12.5  11.5 - 15.5 %   Platelets 244  150 - 400 K/uL  PROTIME-INR     Status: None   Collection Time    12/13/13  6:32 AM      Result Value Ref Range   Prothrombin Time 13.1  11.6 - 15.2 seconds   INR 0.99  0.00 - 1.49  GLUCOSE, CAPILLARY     Status: Abnormal   Collection Time    12/13/13  7:39 AM      Result Value Ref Range  Glucose-Capillary 165 (*) 70 - 99 mg/dL   Comment 1 Documented in Chart     Comment 2 Notify RN      IMAGING: Dg Chest 2 View  12/12/2013   CLINICAL DATA:  Chest pain  EXAM: CHEST  2 VIEW  COMPARISON:  01/06/2011  FINDINGS: Normal heart size and mediastinal contours. Coronary stent noted, likely in the left circulation. There is no edema, consolidation, effusion, or pneumothorax. Minimal scarring present at the peripheral left base. Cholecystectomy changes.  IMPRESSION: No evidence of active disease.   Electronically Signed   By: Jorje Guild M.D.   On: 12/12/2013 22:25     HOSPITAL DIAGNOSES: Principal Problem:   Chest pain Active Problems:   DIABETES MELLITUS, TYPE II   HYPERTENSION   IMPRESSION: 1. Chest pain, possibly GERD versus unstable angina versus hypertensive emergency 2. CAD s/p Taxus stent to LAD in 2006 3. Hypertensive emergency (SBP >200 on admission)  RECOMMENDATION: 1. Mrs. [name] had acute onset chest pain which was different from her symptoms in 2006. There may have been relief with Tums, but it was not immediate. Her BP was elevated and she did take her medications and did not eat anything salty. The symptoms have resolved. There are definitely some features of GERD, however, difficult to explain why she was so hypertensive (she was up to 996 systolic this morning, but normotensive this am). This could be ischemically mediated. She has not had a functional study in years. Troponin is negative x 3. Would recommend a lexiscan myoview today. If negative, would treat for GERD and consider adjusting her anti-hypertensive regimen.  Time Spent Directly with Patient: 30 minutes  Pixie Casino, MD, [facility] Attending Cardiologist [facility]  HILTY,Kenneth C 12/13/2013, 8:29 AM

## 2013-12-13 NOTE — Progress Notes (Signed)
Discussed with Dr. Bradly ChrisStroud, the interpreting radiologist after I have reviewed her stress test images. I do not feel that there is significant reversible ischemia, however, the computer picks up a SDS of 5 with slightly worse lateral reversibility at stress, that does not appear visually. The rest images actually appear somewhat worse. Given the equivocal findings, in light of her chest pain and unexplained hypertension, a definitive cardiac catheterization is recommended.  Michaela NoseKenneth C. Michaela Reach, MD, [facility] Attending Cardiologist [facility]

## 2013-12-14 ENCOUNTER — Encounter (HOSPITAL_COMMUNITY)
Admission: EM | Disposition: A | Discharge status: Home / Self Care | Payer: Medicare Other | Source: Home / Self Care | Attending: Internal Medicine

## 2013-12-14 DIAGNOSIS — Z9089 Acquired absence of other organs: Secondary | ICD-10-CM | POA: Diagnosis not present

## 2013-12-14 DIAGNOSIS — Z7902 Long term (current) use of antithrombotics/antiplatelets: Secondary | ICD-10-CM | POA: Diagnosis not present

## 2013-12-14 DIAGNOSIS — Z9861 Coronary angioplasty status: Secondary | ICD-10-CM | POA: Diagnosis not present

## 2013-12-14 DIAGNOSIS — K5730 Diverticulosis of large intestine without perforation or abscess without bleeding: Secondary | ICD-10-CM | POA: Diagnosis present

## 2013-12-14 DIAGNOSIS — I200 Unstable angina: Secondary | ICD-10-CM | POA: Diagnosis present

## 2013-12-14 DIAGNOSIS — Z888 Allergy status to other drugs, medicaments and biological substances status: Secondary | ICD-10-CM | POA: Diagnosis not present

## 2013-12-14 DIAGNOSIS — N182 Chronic kidney disease, stage 2 (mild): Secondary | ICD-10-CM

## 2013-12-14 DIAGNOSIS — I129 Hypertensive chronic kidney disease with stage 1 through stage 4 chronic kidney disease, or unspecified chronic kidney disease: Secondary | ICD-10-CM | POA: Diagnosis present

## 2013-12-14 DIAGNOSIS — K219 Gastro-esophageal reflux disease without esophagitis: Secondary | ICD-10-CM | POA: Diagnosis present

## 2013-12-14 DIAGNOSIS — R079 Chest pain, unspecified: Secondary | ICD-10-CM

## 2013-12-14 DIAGNOSIS — I2510 Atherosclerotic heart disease of native coronary artery without angina pectoris: Secondary | ICD-10-CM | POA: Diagnosis present

## 2013-12-14 DIAGNOSIS — Z882 Allergy status to sulfonamides status: Secondary | ICD-10-CM | POA: Diagnosis not present

## 2013-12-14 DIAGNOSIS — E785 Hyperlipidemia, unspecified: Secondary | ICD-10-CM | POA: Diagnosis present

## 2013-12-14 DIAGNOSIS — E1149 Type 2 diabetes mellitus with other diabetic neurological complication: Secondary | ICD-10-CM | POA: Diagnosis present

## 2013-12-14 DIAGNOSIS — Z7982 Long term (current) use of aspirin: Secondary | ICD-10-CM | POA: Diagnosis not present

## 2013-12-14 DIAGNOSIS — Z794 Long term (current) use of insulin: Secondary | ICD-10-CM | POA: Diagnosis not present

## 2013-12-14 DIAGNOSIS — Z79899 Other long term (current) drug therapy: Secondary | ICD-10-CM | POA: Diagnosis not present

## 2013-12-14 DIAGNOSIS — Z8249 Family history of ischemic heart disease and other diseases of the circulatory system: Secondary | ICD-10-CM | POA: Diagnosis not present

## 2013-12-14 DIAGNOSIS — E1142 Type 2 diabetes mellitus with diabetic polyneuropathy: Secondary | ICD-10-CM | POA: Diagnosis present

## 2013-12-14 DIAGNOSIS — N189 Chronic kidney disease, unspecified: Secondary | ICD-10-CM | POA: Diagnosis present

## 2013-12-14 HISTORY — PX: LEFT HEART CATHETERIZATION WITH CORONARY ANGIOGRAM: SHX5451

## 2013-12-14 LAB — BASIC METABOLIC PANEL
Anion gap: 14 (ref 5–15)
BUN: 31 mg/dL — ABNORMAL HIGH (ref 6–23)
CO2: 20 mEq/L (ref 19–32)
Calcium: 9 mg/dL (ref 8.4–10.5)
Chloride: 107 mEq/L (ref 96–112)
Creatinine, Ser: 1.55 mg/dL — ABNORMAL HIGH (ref 0.50–1.10)
GFR calc Af Amer: 36 mL/min — ABNORMAL LOW (ref >90)
GFR calc non Af Amer: 31 mL/min — ABNORMAL LOW (ref >90)
Glucose, Bld: 148 mg/dL — ABNORMAL HIGH (ref 70–99)
Potassium: 4.3 mEq/L (ref 3.7–5.3)
Sodium: 141 mEq/L (ref 137–147)

## 2013-12-14 LAB — GLUCOSE, CAPILLARY
GLUCOSE-CAPILLARY: 157 mg/dL — AB (ref 70–99)
GLUCOSE-CAPILLARY: 129 mg/dL — AB (ref 70–99)
Glucose-Capillary: 127 mg/dL — ABNORMAL HIGH (ref 70–99)
Glucose-Capillary: 173 mg/dL — ABNORMAL HIGH (ref 70–99)

## 2013-12-14 LAB — CBC
HCT: 34.6 % — ABNORMAL LOW (ref 36.0–46.0)
Hemoglobin: 11.5 g/dL — ABNORMAL LOW (ref 12.0–15.0)
MCH: 30.4 pg (ref 26.0–34.0)
MCHC: 33.2 g/dL (ref 30.0–36.0)
MCV: 91.5 fL (ref 78.0–100.0)
Platelets: 246 10*3/uL (ref 150–400)
RBC: 3.78 MIL/uL — ABNORMAL LOW (ref 3.87–5.11)
RDW: 12.7 % (ref 11.5–15.5)
WBC: 6.9 10*3/uL (ref 4.0–10.5)

## 2013-12-14 LAB — PROTIME-INR
INR: 1.03 (ref 0.00–1.49)
Prothrombin Time: 13.5 seconds (ref 11.6–15.2)

## 2013-12-14 LAB — POCT ACTIVATED CLOTTING TIME: Activated Clotting Time: 473 seconds

## 2013-12-14 SURGERY — LEFT HEART CATHETERIZATION WITH CORONARY ANGIOGRAM
Anesthesia: LOCAL

## 2013-12-14 MED ORDER — NITROGLYCERIN 1 MG/10 ML FOR IR/CATH LAB
INTRA_ARTERIAL | Status: AC
  Filled 2013-12-14: qty 10

## 2013-12-14 MED ORDER — HEPARIN (PORCINE) IN NACL 2-0.9 UNIT/ML-% IJ SOLN
INTRAMUSCULAR | Status: AC
  Filled 2013-12-14: qty 1000

## 2013-12-14 MED ORDER — CLOPIDOGREL BISULFATE 300 MG PO TABS
ORAL_TABLET | ORAL | Status: AC
  Filled 2013-12-14: qty 1

## 2013-12-14 MED ORDER — HYDRALAZINE HCL 20 MG/ML IJ SOLN
INTRAMUSCULAR | Status: AC
  Filled 2013-12-14: qty 1

## 2013-12-14 MED ORDER — HEPARIN SODIUM (PORCINE) 1000 UNIT/ML IJ SOLN
INTRAMUSCULAR | Status: AC
  Filled 2013-12-14: qty 1

## 2013-12-14 MED ORDER — BIVALIRUDIN 250 MG IV SOLR
INTRAVENOUS | Status: AC
  Filled 2013-12-14: qty 250

## 2013-12-14 MED ORDER — LIDOCAINE HCL (PF) 1 % IJ SOLN
INTRAMUSCULAR | Status: AC
  Filled 2013-12-14: qty 30

## 2013-12-14 MED ORDER — VERAPAMIL HCL 2.5 MG/ML IV SOLN
INTRAVENOUS | Status: AC
  Filled 2013-12-14: qty 2

## 2013-12-14 MED ORDER — MIDAZOLAM HCL 2 MG/2ML IJ SOLN
INTRAMUSCULAR | Status: AC
  Filled 2013-12-14: qty 2

## 2013-12-14 MED ORDER — FENTANYL CITRATE 0.05 MG/ML IJ SOLN
INTRAMUSCULAR | Status: AC
  Filled 2013-12-14: qty 2

## 2013-12-14 MED ORDER — SODIUM CHLORIDE 0.9 % IV SOLN: 1.0000 mL/kg/h | INTRAVENOUS | Status: AC

## 2013-12-14 MED ORDER — HYDRALAZINE HCL 20 MG/ML IJ SOLN
10.0000 mg | Freq: Once | INTRAMUSCULAR | Status: AC
  Administered 2013-12-14: 10 mg via INTRAVENOUS

## 2013-12-14 MED ORDER — ZOLPIDEM TARTRATE 5 MG PO TABS
5.0000 mg | ORAL_TABLET | Freq: Once | ORAL | Status: AC
  Administered 2013-12-14: 22:00:00 5 mg via ORAL
  Filled 2013-12-14: qty 1

## 2013-12-14 MED ORDER — CLOPIDOGREL BISULFATE 75 MG PO TABS
75.0000 mg | ORAL_TABLET | Freq: Every day | ORAL | Status: DC
  Administered 2013-12-15: 75 mg via ORAL
  Filled 2013-12-14: qty 1

## 2013-12-14 NOTE — Progress Notes (Signed)
[facility] Progress Note   [name] ZOX:[id number] DOB: [birth year] DOA: 12/12/2013 PCP: Danise Edge, MD  Brief narrative: [name] is a 80 y.o. female with PMH of CAD s/p stenting in December 2006 (Last cath was in 2007 w/moderate 3 vessel disease, patent LAD stent and severe stenosis in the small caliber diagonal branches), IDDM with severe peripheral neuropathy, HTN, and HLD, who presents to the ED with chest pain.  Subjective: Awaiting cath. No further discomfort.   Assessment/Plan: Principal Problem:   Chest pain - myoview noted to be positive-  - cath revealed critical stenosis in mid LCx with stenting done - mod diffuse disease in RCA which will be medically managed - LAD stent is patent - Cardiology recommends monitoring overnight.  Active Problems:   DIABETES MELLITUS, TYPE II - cont Lantus and Novolog    HYPERTENSION -cont Coreg, Lisinopril    Code Status: Full code Family Communication: none Disposition Plan: home when stable  Consultants: cardiology  Procedures: Myoview stress test  Antibiotics: Anti-infectives   None         Objective: Filed Weights   12/12/13 2138 12/13/13 0114 12/14/13 0519  Weight: 79.833 kg (176 lb) 81.239 kg (179 lb 1.6 oz) 80.241 kg (176 lb 14.4 oz)    Intake/Output Summary (Last 24 hours) at 12/14/13 1606 Last data filed at 12/14/13 1445  Gross per 24 hour  Intake    120 ml  Output      0 ml  Net    120 ml     Vitals Filed Vitals:   12/14/13 0519 12/14/13 1018 12/14/13 1308 12/14/13 1424  BP: 122/50 151/62  167/64  Pulse: 65  73 58  Temp: 98 F (36.7 C)   97.6 F (36.4 C)  TempSrc: Oral   Oral  Resp: 18   18  Height:      Weight: 80.241 kg (176 lb 14.4 oz)     SpO2: 98%   100%    Exam: General: No acute respiratory distress Lungs: Clear to auscultation bilaterally without wheezes or crackles Cardiovascular: Regular rate and rhythm without murmur gallop or rub normal S1 and  S2 Abdomen: Nontender, nondistended, soft, bowel sounds positive, no rebound, no ascites, no appreciable mass Extremities: No significant cyanosis, clubbing, or edema bilateral lower extremities  Data Reviewed: Basic Metabolic Panel:  Recent Labs Lab 12/12/13 2159 12/13/13 0632 12/14/13 0437  NA 137 141 141  K 4.4 4.4 4.3  CL 102 105 107  CO2 21 22 20   GLUCOSE 193* 134* 148*  BUN 32* 32* 31*  CREATININE 1.56* 1.60* 1.55*  CALCIUM 9.5 9.6 9.0   Liver Function Tests: No results found for this basename: AST, ALT, ALKPHOS, BILITOT, PROT, ALBUMIN,  in the last 168 hours No results found for this basename: LIPASE, AMYLASE,  in the last 168 hours No results found for this basename: AMMONIA,  in the last 168 hours CBC:  Recent Labs Lab 12/12/13 2159 12/13/13 0632 12/14/13 0437  WBC 10.0 6.8 6.9  HGB 12.7 11.6* 11.5*  HCT 38.5 35.1* 34.6*  MCV 93.0 92.9 91.5  PLT 273 244 246   Cardiac Enzymes:  Recent Labs Lab 12/12/13 2159 12/13/13 0001 12/13/13 0632 12/13/13 1020  TROPONINI <0.30 <0.30 <0.30 <0.30   BNP (last 3 results) No results found for this basename: PROBNP,  in the last 8760 hours CBG:  Recent Labs Lab 12/13/13 0739 12/13/13 1643 12/13/13 2101 12/14/13 0722 12/14/13 1150  GLUCAP 165* 273* 163* 157* 127*  No results found for this or any previous visit (from the past 240 hour(s)).   Studies:  Recent x-ray studies have been reviewed in detail by the Attending Physician  Scheduled Meds:  Scheduled Meds: . aspirin EC  81 mg Oral Daily  . carvedilol  6.25 mg Oral BID WC  . [START ON 12/15/2013] clopidogrel  75 mg Oral Q breakfast  . fenofibrate  54 mg Oral Daily  . insulin aspart  10 Units Subcutaneous BID AC  . insulin aspart  12 Units Subcutaneous QAC lunch  . insulin glargine  22 Units Subcutaneous QHS  . lisinopril  2.5 mg Oral Daily  . pantoprazole  40 mg Oral Daily  . simvastatin  40 mg Oral q1800  . sodium chloride  3 mL Intravenous  Q12H   Continuous Infusions: . sodium chloride      Time spent on care of this patient: 25 min   Yasuko Lapage, MD 12/13/2013, 4:06 PM  LOS: 2 days   [facility] Office  [phone] Pager - Text Page per [email or web address]  If 7PM-7AM, please contact night-coverage [email or web address]

## 2013-12-14 NOTE — Progress Notes (Addendum)
[facility] Progress Note   [name] ONG:[id number] DOB: 04/01/1934 DOA: 12/12/2013 PCP: Michaela Edge, MD  Brief narrative: [name] is a 80 y.o. female with PMH of CAD s/p stenting in December 2006 (Last cath was in 2007 w/moderate 3 vessel disease, patent LAD stent and severe stenosis in the small caliber diagonal branches), IDDM with severe peripheral neuropathy, HTN, and HLD, who presents to the ED with chest pain.  Subjective: No further episodes while in the hospital  Assessment/Plan: Principal Problem:   Chest pain - myoview noted to be positive- will need a cath  Active Problems:   DIABETES MELLITUS, TYPE II - cont Lantus and Novolog    HYPERTENSION -cont Coreg, Lisinopril    Code Status: Full code Family Communication: none Disposition Plan: home when stable  Consultants: cardiology  Procedures: Myoview stress test  Antibiotics: Anti-infectives   None         Objective: Filed Weights   12/12/13 2138 12/13/13 0114 12/14/13 0519  Weight: 79.833 kg (176 lb) 81.239 kg (179 lb 1.6 oz) 80.241 kg (176 lb 14.4 oz)    Intake/Output Summary (Last 24 hours) at 12/14/13 1544 Last data filed at 12/14/13 1445  Gross per 24 hour  Intake    120 ml  Output      0 ml  Net    120 ml     Vitals Filed Vitals:   12/14/13 0519 12/14/13 1018 12/14/13 1308 12/14/13 1424  BP: 122/50 151/62  167/64  Pulse: 65  73 58  Temp: 98 F (36.7 C)   97.6 F (36.4 C)  TempSrc: Oral   Oral  Resp: 18   18  Height:      Weight: 80.241 kg (176 lb 14.4 oz)     SpO2: 98%   100%    Exam: General: No acute respiratory distress Lungs: Clear to auscultation bilaterally without wheezes or crackles Cardiovascular: Regular rate and rhythm without murmur gallop or rub normal S1 and S2 Abdomen: Nontender, nondistended, soft, bowel sounds positive, no rebound, no ascites, no appreciable mass Extremities: No significant cyanosis, clubbing, or edema bilateral  lower extremities  Data Reviewed: Basic Metabolic Panel:  Recent Labs Lab 12/12/13 2159 12/13/13 0632 12/14/13 0437  NA 137 141 141  K 4.4 4.4 4.3  CL 102 105 107  CO2 21 22 20   GLUCOSE 193* 134* 148*  BUN 32* 32* 31*  CREATININE 1.56* 1.60* 1.55*  CALCIUM 9.5 9.6 9.0   Liver Function Tests: No results found for this basename: AST, ALT, ALKPHOS, BILITOT, PROT, ALBUMIN,  in the last 168 hours No results found for this basename: LIPASE, AMYLASE,  in the last 168 hours No results found for this basename: AMMONIA,  in the last 168 hours CBC:  Recent Labs Lab 12/12/13 2159 12/13/13 0632 12/14/13 0437  WBC 10.0 6.8 6.9  HGB 12.7 11.6* 11.5*  HCT 38.5 35.1* 34.6*  MCV 93.0 92.9 91.5  PLT 273 244 246   Cardiac Enzymes:  Recent Labs Lab 12/12/13 2159 12/13/13 0001 12/13/13 0632 12/13/13 1020  TROPONINI <0.30 <0.30 <0.30 <0.30   BNP (last 3 results) No results found for this basename: PROBNP,  in the last 8760 hours CBG:  Recent Labs Lab 12/13/13 0739 12/13/13 1643 12/13/13 2101 12/14/13 0722 12/14/13 1150  GLUCAP 165* 273* 163* 157* 127*    No results found for this or any previous visit (from the past 240 hour(s)).   Studies:  Recent x-ray studies have been reviewed in  detail by the Attending Physician  Scheduled Meds:  Scheduled Meds: . aspirin EC  81 mg Oral Daily  . carvedilol  6.25 mg Oral BID WC  . [START ON 12/15/2013] clopidogrel  75 mg Oral Q breakfast  . fenofibrate  54 mg Oral Daily  . insulin aspart  10 Units Subcutaneous BID AC  . insulin aspart  12 Units Subcutaneous QAC lunch  . insulin glargine  22 Units Subcutaneous QHS  . lisinopril  2.5 mg Oral Daily  . pantoprazole  40 mg Oral Daily  . simvastatin  40 mg Oral q1800  . sodium chloride  3 mL Intravenous Q12H   Continuous Infusions: . sodium chloride      Time spent on care of this patient: 25 min   Michaela Tippin, MD 12/13/2013, 3:44 PM  LOS: 2 days   [facility] Office  [phone] Pager - Text Page per [email or web address]  If 7PM-7AM, please contact night-coverage [email or web address]

## 2013-12-14 NOTE — CV Procedure (Signed)
    Cardiac Catheterization Procedure Note  Name: [name] MRN: [id number] DOB: July 09, 1933  Procedure: Left Heart Cath, Selective Coronary Angiography,  PTCA and stenting of the LCx  Indication:80 yo WF with remote stenting of the LAD in 2006 presents with unstable angina. Myoview study shows basal anterior and posterolateral ischemia.   Procedural Details:  The right wrist was prepped, draped, and anesthetized with 1% lidocaine. Using the modified Seldinger technique, a 6 French slender sheath was introduced into the right radial artery. 3 mg of verapamil was administered through the sheath, weight-based unfractionated heparin was administered intravenously. Standard Judkins catheters were used for selective coronary angiography and left ventriculography. Catheter exchanges were performed over an exchange length guidewire.  PROCEDURAL FINDINGS Hemodynamics: AO 155/60 mean 99 mm Hg LV 155/8 mm Hg   Coronary angiography: Coronary dominance: right  Left mainstem: Normal.  Left anterior descending (LAD): The stent in the mid LAD has diffuse 20-30% in stent disease. The first diagonal is small to moderate in size with 80% proximal stenosis. The second diagonal is tiny with 95% ostial disease.   Left circumflex (LCx): The LCx gives rise to a single OM branch. There is a 99% stenosis in the mid LCx.  Right coronary artery (RCA): The RCA is a dominant vessel. There is diffuse disease in the mid vessel to 50%. The distal vessel is diffusely diseased to 40%. The PDA has a 50-70% stenosis in the proximal vessel. There is 50-60% [facility]   Left ventriculography: not done  PCI Note:  Following the diagnostic procedure, the decision was made to proceed with PCI of the LCx.  Plavix 600 mg was given orally. Weight-based bivalirudin was given for anticoagulation. Once a therapeutic ACT was achieved, a 6 [place] XBLAD 3.5 guide catheter was inserted.  A prowater coronary  guidewire was used to cross the lesion.  The lesion was predilated with a 2.0 mm balloon.  The lesion was then stented with a 2.25 x 28 mm Promus stent.  The stent was postdilated with a 2.5 mm noncompliant balloon.  Following PCI, there was 0% residual stenosis and TIMI-3 flow. Final angiography confirmed an excellent result. The patient tolerated the procedure well. There were no immediate procedural complications. A TR band was used for radial hemostasis. The patient was transferred to the post catheterization recovery area for further monitoring.  PCI Data: Vessel - LCx/Segment - mid Percent Stenosis (pre)  99% TIMI-flow 3 Stent 2.25 x 28 mm Promus Percent Stenosis (post) 0% TIMI-flow (post) 3   Final Conclusions:   1. ASCAD with critical stenosis in the mid LCx. Moderate diffuse disease in the RCA. The LAD stent is still patent. Moderate to severe chronic disease in the diagonals.  2. Successful stenting of the mid LCx with a DES.   Recommendations:  DAPT for one year. I would treat her residual disease medically.  Peter SwazilandJordan, MDFACC 12/14/2013, 1:51 PM

## 2013-12-14 NOTE — Interval H&P Note (Signed)
History and Physical Interval Note:  12/14/2013 12:53 PM  Mallory ShirkMildred S Hammond  has presented today for surgery, with the diagnosis of cp  The various methods of treatment have been discussed with the patient and family. After consideration of risks, benefits and other options for treatment, the patient has consented to  Procedure(s): LEFT HEART CATHETERIZATION WITH CORONARY ANGIOGRAM (N/A) as a surgical intervention .  The patient's history has been reviewed, patient examined, no change in status, stable for surgery.  I have reviewed the patient's chart and labs.  Questions were answered to the patient's satisfaction.   Cath Lab Visit (complete for each Cath Lab visit)  Clinical Evaluation Leading to the Procedure:   ACS: Yes.    Non-ACS:    Anginal Classification: CCS IV  Anti-ischemic medical therapy: Minimal Therapy (1 class of medications)  Non-Invasive Test Results: Low-risk stress test findings: cardiac mortality <1%/year  Prior CABG: No previous CABG        Theron Aristaeter [facility] PhiladeLPhiaJordanMD,FACC 12/14/2013 12:54 PM

## 2013-12-14 NOTE — Progress Notes (Addendum)
Patient Name: Michaela ShirkMildred S Hammond Date of Encounter: 12/14/2013     Principal Problem:   Chest pain Active Problems:   DIABETES MELLITUS, TYPE II   HYPERTENSION    SUBJECTIVE  Awaiting cardiac cath. No complaints.  CURRENT MEDS . aspirin EC  81 mg Oral Daily  . carvedilol  6.25 mg Oral BID WC  . fenofibrate  54 mg Oral Daily  . insulin aspart  10 Units Subcutaneous BID AC  . insulin aspart  12 Units Subcutaneous QAC lunch  . insulin glargine  22 Units Subcutaneous QHS  . lisinopril  2.5 mg Oral Daily  . pantoprazole  40 mg Oral Daily  . simvastatin  40 mg Oral q1800  . sodium chloride  3 mL Intravenous Q12H    OBJECTIVE  Filed Vitals:   12/13/13 1657 12/13/13 2057 12/14/13 0519 12/14/13 1018  BP: 144/59 176/55 122/50 151/62  Pulse: 69 64 65   Temp: 98.4 F (36.9 C) 98.6 F (37 C) 98 F (36.7 C)   TempSrc: Oral Oral Oral   Resp: 17 18 18    Height:      Weight:   176 lb 14.4 oz (80.241 kg)   SpO2: 99% 98% 98%    No intake or output data in the 24 hours ending 12/14/13 1054 Filed Weights   12/12/13 2138 12/13/13 0114 12/14/13 0519  Weight: 176 lb (79.833 kg) 179 lb 1.6 oz (81.239 kg) 176 lb 14.4 oz (80.241 kg)    PHYSICAL EXAM  General appearance: alert and no distress  Neck: no carotid bruit and no JVD  Lungs: clear to auscultation bilaterally  Heart: regular rate and rhythm, S1, S2 normal, no murmur, click, rub or gallop  Abdomen: soft, non-tender; bowel sounds normal; no masses, no organomegaly  Extremities: extremities normal, atraumatic, no cyanosis or edema  Pulses: 2+ and symmetric  Skin: Skin color, texture, turgor normal. No rashes or lesions  Neurologic: Grossly normal  Psych: Normal   Accessory Clinical Findings  CBC  Recent Labs  12/13/13 0632 12/14/13 0437  WBC 6.8 6.9  HGB 11.6* 11.5*  HCT 35.1* 34.6*  MCV 92.9 91.5  PLT 244 246   Basic Metabolic Panel  Recent Labs  12/13/13 0632 12/14/13 0437  NA 141 141  K 4.4 4.3    CL 105 107  CO2 22 20  GLUCOSE 134* 148*  BUN 32* 31*  CREATININE 1.60* 1.55*  CALCIUM 9.6 9.0    Cardiac Enzymes  Recent Labs  12/13/13 0001 12/13/13 0632 12/13/13 1020  TROPONINI <0.30 <0.30 <0.30   Hemoglobin A1C  Recent Labs  12/13/13 0013  HGBA1C 7.1*    TELE  NSR , few PVCs  Radiology/Studies  Dg Chest 2 View  12/12/2013   CLINICAL DATA:  Chest pain  EXAM: CHEST  2 VIEW  COMPARISON:  01/06/2011  FINDINGS: Normal heart size and mediastinal contours. Coronary stent noted, likely in the left circulation. There is no edema, consolidation, effusion, or pneumothorax. Minimal scarring present at the peripheral left base. Cholecystectomy changes.  IMPRESSION: No evidence of active disease.   Electronically Signed   By: Tiburcio PeaJonathan  Watts M.D.   On: 12/12/2013 22:25   Nm Myocar Multi W/spect W/wall Motion / Ef  12/13/2013   ADDENDUM REPORT: 12/13/2013 17:14  ADDENDUM: Findings were discussed with Dr. Rennis GoldenHilty. As mentioned in the above report the reversibility is seen on the polar map images only. The reversible changes are not appreciated on the multi planer SPECT images. If there is  a low to intermediate clinical probability for myocardial ischemiathe polar map findings may reflect non-uniform soft tissue attenuation artifact.   Electronically Signed   By: Signa Kell M.D.   On: 12/13/2013 17:14   12/13/2013   CLINICAL DATA:  Chest discomfort  EXAM: MYOCARDIAL IMAGING WITH SPECT (REST AND PHARMACOLOGIC-STRESS)  GATED LEFT VENTRICULAR WALL MOTION STUDY  LEFT VENTRICULAR EJECTION FRACTION  TECHNIQUE: Standard myocardial SPECT imaging was performed after resting intravenous injection of 10 mCi Tc-65m sestamibi. Subsequently, intravenous infusion of Lexiscan was performed under the supervision of the Cardiology staff. At peak effect of the drug, 30 mCi Tc-98m sestamibi was injected intravenously and standard myocardial SPECT imaging was performed. Quantitative gated imaging was also  performed to evaluate left ventricular wall motion, and estimate left ventricular ejection fraction.  COMPARISON:  None.  FINDINGS: Multi planer SPECT: On the polar map images there is a medium size focus of moderate reversibility involving the middle segments of the anterior and posterior lateral wall. This has a summed difference score of 5.  Gated SPECT:  Normal left ventricular wall motion and thickening.  The left ventricular ejection fraction: The left ventricular ejection fraction is equal to 83%.  IMPRESSION: 1. There is moderate reversibility involving the proximal segments of the anterior posterior or lateral wall with a summed difference score equal to 5 Findings may reflect ischemia in the distribution of the left circumflex artery. 2. Normal left ventricular systolic function. 3. Left ventricular ejection fraction equals 83 %  Electronically Signed: By: Signa Kell M.D. On: 12/13/2013 16:30    ASSESSMENT AND PLAN [name] is a 80 y.o. pleasant female with PMH of CAD s/p stenting in December 2006 (Last cath was in 2007 w/moderate 3 vessel disease, patent LAD stent and severe stenosis in the small caliber diagonal branches), IDDM with severe peripheral neuropathy, HTN, and HLD, who presents to the ED on 12/13/13 with chest pain.  Chest pain- She underwent exercise myoview yesterday which returned equivocal. Dr. Rennis Golden reviwed the images and did not feel that there was significant reversible ischemia, however, the computer picks up a SDS of 5 with slightly worse lateral reversibility at stress, that does not appear visually. The rest images actually appeared somewhat worse. Given the equivocal findings, in light of her chest pain and unexplained hypertension, a definitive cardiac catheterization was recommended. -- Plan is cardiac cath today @ 1:30pm.  HTN- blood pressure better controlled   Signed, Thereasa Parkin PA-C  Pager [phone]   I have examined the patient and reviewed  assessment and plan and discussed with patient.  Agree with above as stated.  Questions about cath answered.  She would prefer radial approach.  Mild renal insufficiency.  Would avoid ventriculogram.  Equivocal stress test.  Mild tightness this AM but no severe pain.  Michaela Espinoza S.

## 2013-12-14 NOTE — Progress Notes (Signed)
TR BAND REMOVAL  LOCATION:  right radial  DEFLATED PER PROTOCOL:  Yes.    TIME BAND OFF / DRESSING APPLIED:   1830   SITE UPON ARRIVAL:   Level 0  SITE AFTER BAND REMOVAL:  Level 0  REVERSE ALLEN'S TEST:    positive  CIRCULATION SENSATION AND MOVEMENT:  Within Normal Limits  Yes.    COMMENTS:    

## 2013-12-14 NOTE — Progress Notes (Signed)
Inpatient Diabetes Program Recommendations  AACE/ADA: New Consensus Statement on Inpatient Glycemic Control (2013)  Target Ranges:  Prepandial:   less than 140 mg/dL      Peak postprandial:   less than 180 mg/dL (1-2 hours)      Critically ill patients:  140 - 180 mg/dL   Reason for Visit: Hyperglycemia  Diabetes history: DM2 Outpatient Diabetes medications: Lantus 22 units QHS, Novolog Hammond/Hammond Current orders for Inpatient glycemic control: Novolog 02-07-09 tid and Lantus 22 units QHS Results for [name], [name] (MRN [id number]) as of 12/14/2013 12:22  Ref. Range 12/13/2013 07:39 12/13/2013 16:43 12/13/2013 21:01 12/14/2013 07:22 12/14/2013 11:50  Glucose-Capillary Latest Range: 70-99 mg/dL 409165 (H) 811273 (H) 914163 (H) 157 (H) 127 (H)  Results for [name], [name] (MRN [id number]) as of 12/14/2013 12:22  Ref. Range 12/13/2013 00:13  Hemoglobin A1C Latest Range: <5.7 % 7.1 (H)     Inpatient Diabetes Program Recommendations Correction (SSI): Add Novolog sensitive tidwc HgbA1C: 7.1% - good glycemic control Diet: When advanced CHO mod med heart healthy  Note: Will continue to follow. Thank you. Ailene Ardshonda Nicholai Willette, RD, LDN, CDE Inpatient Diabetes Coordinator [phone]

## 2013-12-14 NOTE — H&P (View-Only) (Signed)
Patient Name: [name] Date of Encounter: 12/14/2013     Principal Problem:   Chest pain Active Problems:   DIABETES MELLITUS, TYPE II   HYPERTENSION    SUBJECTIVE  Awaiting cardiac cath. No complaints.  CURRENT MEDS . aspirin EC  81 mg Oral Daily  . carvedilol  6.25 mg Oral BID WC  . fenofibrate  54 mg Oral Daily  . insulin aspart  10 Units Subcutaneous BID AC  . insulin aspart  12 Units Subcutaneous QAC lunch  . insulin glargine  22 Units Subcutaneous QHS  . lisinopril  2.5 mg Oral Daily  . pantoprazole  40 mg Oral Daily  . simvastatin  40 mg Oral q1800  . sodium chloride  3 mL Intravenous Q12H    OBJECTIVE  Filed Vitals:   12/13/13 1657 12/13/13 2057 12/14/13 0519 12/14/13 1018  BP: 144/59 176/55 122/50 151/62  Pulse: 69 64 65   Temp: 98.4 F (36.9 C) 98.6 F (37 C) 98 F (36.7 C)   TempSrc: Oral Oral Oral   Resp: 17 18 18    Height:      Weight:   176 lb 14.4 oz (80.241 kg)   SpO2: 99% 98% 98%    No intake or output data in the 24 hours ending 12/14/13 1054 Filed Weights   12/12/13 2138 12/13/13 0114 12/14/13 0519  Weight: 176 lb (79.833 kg) 179 lb 1.6 oz (81.239 kg) 176 lb 14.4 oz (80.241 kg)    PHYSICAL EXAM  General appearance: alert and no distress  Neck: no carotid bruit and no JVD  Lungs: clear to auscultation bilaterally  Heart: regular rate and rhythm, S1, S2 normal, no murmur, click, rub or gallop  Abdomen: soft, non-tender; bowel sounds normal; no masses, no organomegaly  Extremities: extremities normal, atraumatic, no cyanosis or edema  Pulses: 2+ and symmetric  Skin: Skin color, texture, turgor normal. No rashes or lesions  Neurologic: Grossly normal  Psych: Normal   Accessory Clinical Findings  CBC  Recent Labs  12/13/13 0632 12/14/13 0437  WBC 6.8 6.9  HGB 11.6* 11.5*  HCT 35.1* 34.6*  MCV 92.9 91.5  PLT 244 246   Basic Metabolic Panel  Recent Labs  12/13/13 0632 12/14/13 0437  NA 141 141  K 4.4 4.3    CL 105 107  CO2 22 20  GLUCOSE 134* 148*  BUN 32* 31*  CREATININE 1.60* 1.55*  CALCIUM 9.6 9.0    Cardiac Enzymes  Recent Labs  12/13/13 0001 12/13/13 0632 12/13/13 1020  TROPONINI <0.30 <0.30 <0.30   Hemoglobin A1C  Recent Labs  12/13/13 0013  HGBA1C 7.1*    TELE  NSR , few PVCs  Radiology/Studies  Dg Chest 2 View  12/12/2013   CLINICAL DATA:  Chest pain  EXAM: CHEST  2 VIEW  COMPARISON:  01/06/2011  FINDINGS: Normal heart size and mediastinal contours. Coronary stent noted, likely in the left circulation. There is no edema, consolidation, effusion, or pneumothorax. Minimal scarring present at the peripheral left base. Cholecystectomy changes.  IMPRESSION: No evidence of active disease.   Electronically Signed   By: Tiburcio PeaJonathan  Watts M.D.   On: 12/12/2013 22:25   Nm Myocar Multi W/spect W/wall Motion / Ef  12/13/2013   ADDENDUM REPORT: 12/13/2013 17:14  ADDENDUM: Findings were discussed with Dr. Rennis GoldenHilty. As mentioned in the above report the reversibility is seen on the polar map images only. The reversible changes are not appreciated on the multi planer SPECT images. If there is  a low to intermediate clinical probability for myocardial ischemiathe polar map findings may reflect non-uniform soft tissue attenuation artifact.   Electronically Signed   By: Signa Kell M.D.   On: 12/13/2013 17:14   12/13/2013   CLINICAL DATA:  Chest discomfort  EXAM: MYOCARDIAL IMAGING WITH SPECT (REST AND PHARMACOLOGIC-STRESS)  GATED LEFT VENTRICULAR WALL MOTION STUDY  LEFT VENTRICULAR EJECTION FRACTION  TECHNIQUE: Standard myocardial SPECT imaging was performed after resting intravenous injection of 10 mCi Tc-65m sestamibi. Subsequently, intravenous infusion of Lexiscan was performed under the supervision of the Cardiology staff. At peak effect of the drug, 30 mCi Tc-98m sestamibi was injected intravenously and standard myocardial SPECT imaging was performed. Quantitative gated imaging was also  performed to evaluate left ventricular wall motion, and estimate left ventricular ejection fraction.  COMPARISON:  None.  FINDINGS: Multi planer SPECT: On the polar map images there is a medium size focus of moderate reversibility involving the middle segments of the anterior and posterior lateral wall. This has a summed difference score of 5.  Gated SPECT:  Normal left ventricular wall motion and thickening.  The left ventricular ejection fraction: The left ventricular ejection fraction is equal to 83%.  IMPRESSION: 1. There is moderate reversibility involving the proximal segments of the anterior posterior or lateral wall with a summed difference score equal to 5 Findings may reflect ischemia in the distribution of the left circumflex artery. 2. Normal left ventricular systolic function. 3. Left ventricular ejection fraction equals 83 %  Electronically Signed: By: Signa Kell M.D. On: 12/13/2013 16:30    ASSESSMENT AND PLAN [name] is a 80 y.o. pleasant female with PMH of CAD s/p stenting in December 2006 (Last cath was in 2007 w/moderate 3 vessel disease, patent LAD stent and severe stenosis in the small caliber diagonal branches), IDDM with severe peripheral neuropathy, HTN, and HLD, who presents to the ED on 12/13/13 with chest pain.  Chest pain- She underwent exercise myoview yesterday which returned equivocal. Dr. Rennis Golden reviwed the images and did not feel that there was significant reversible ischemia, however, the computer picks up a SDS of 5 with slightly worse lateral reversibility at stress, that does not appear visually. The rest images actually appeared somewhat worse. Given the equivocal findings, in light of her chest pain and unexplained hypertension, a definitive cardiac catheterization was recommended. -- Plan is cardiac cath today @ 1:30pm.  HTN- blood pressure better controlled   Signed, Thereasa Parkin PA-C  Pager [phone]   I have examined the patient and reviewed  assessment and plan and discussed with patient.  Agree with above as stated.  Questions about cath answered.  She would prefer radial approach.  Mild renal insufficiency.  Would avoid ventriculogram.  Equivocal stress test.  Mild tightness this AM but no severe pain.  VARANASI,JAYADEEP S.

## 2013-12-15 DIAGNOSIS — I2510 Atherosclerotic heart disease of native coronary artery without angina pectoris: Secondary | ICD-10-CM

## 2013-12-15 DIAGNOSIS — I200 Unstable angina: Secondary | ICD-10-CM

## 2013-12-15 LAB — BASIC METABOLIC PANEL
Anion gap: 14 (ref 5–15)
BUN: 27 mg/dL — AB (ref 6–23)
CO2: 19 mEq/L (ref 19–32)
CREATININE: 1.46 mg/dL — AB (ref 0.50–1.10)
Calcium: 9 mg/dL (ref 8.4–10.5)
Chloride: 106 mEq/L (ref 96–112)
GFR calc Af Amer: 39 mL/min — ABNORMAL LOW (ref >90)
GFR calc non Af Amer: 33 mL/min — ABNORMAL LOW (ref >90)
Glucose, Bld: 130 mg/dL — ABNORMAL HIGH (ref 70–99)
Potassium: 3.9 mEq/L (ref 3.7–5.3)
Sodium: 139 mEq/L (ref 137–147)

## 2013-12-15 LAB — CBC
HCT: 33.3 % — ABNORMAL LOW (ref 36.0–46.0)
Hemoglobin: 11.4 g/dL — ABNORMAL LOW (ref 12.0–15.0)
MCH: 31.1 pg (ref 26.0–34.0)
MCHC: 34.2 g/dL (ref 30.0–36.0)
MCV: 91 fL (ref 78.0–100.0)
Platelets: 234 10*3/uL (ref 150–400)
RBC: 3.66 MIL/uL — ABNORMAL LOW (ref 3.87–5.11)
RDW: 12.6 % (ref 11.5–15.5)
WBC: 8.4 10*3/uL (ref 4.0–10.5)

## 2013-12-15 LAB — GLUCOSE, CAPILLARY: Glucose-Capillary: 123 mg/dL — ABNORMAL HIGH (ref 70–99)

## 2013-12-15 MED ORDER — CARVEDILOL 6.25 MG PO TABS: 6.2500 mg | ORAL_TABLET | Freq: Two times a day (BID) | ORAL | Status: DC

## 2013-12-15 MED ORDER — CLOPIDOGREL BISULFATE 75 MG PO TABS: 75.0000 mg | ORAL_TABLET | Freq: Every day | ORAL | Status: DC

## 2013-12-15 MED FILL — Sodium Chloride IV Soln 0.9%: INTRAVENOUS | Qty: 50 | Status: AC

## 2013-12-15 NOTE — ED Provider Notes (Signed)
I saw and evaluated the patient, reviewed the resident's note and I agree with the findings and plan.   EKG Interpretation   Date/Time:  Sunday December 12 2013 21:37:30 EDT Ventricular Rate:  82 PR Interval:  132 QRS Duration: 72 QT Interval:  368 QTC Calculation: 429 R Axis:   17 Text Interpretation:  Normal sinus rhythm Low voltage QRS Cannot rule out  Anterior infarct , age undetermined Abnormal ECG No significant change  since last tracing Confirmed by Rubin PayorPICKERING  MD, Harrold DonathNATHAN [phone] on 12/12/2013  10:19:38 PM     Patient with chest pain. Pain-free now. EKG and lab work reassuring overall. High risk and will admit to internal medicine  Juliet RudeNathan R. Rubin PayorPickering, MD 12/15/13 [id number]

## 2013-12-15 NOTE — Progress Notes (Signed)
CARDIAC REHAB PHASE I   PRE:  Rate/Rhythm: 78 (off monitor)    BP: sitting 133/64    SaO2:   MODE:  Ambulation: 250 ft   POST:  Rate/Rhythm: 96     BP: sitting 89/64, recheck 126/57     SaO2:   Tolerated fairly well. Limited by her neuropathy. x1 LOB when she became distracted. Encouraged pt to use her cane to steady herself. Ed completed. Will call [facility] if she is interested. [phone]   [name], [name] CES, ACSM 12/15/2013 9:37 AM

## 2013-12-15 NOTE — Discharge Instructions (Addendum)
Please follow up with cardiologist in 3-4 week s Please follow up with primary care doctor in 1-2 weeks

## 2013-12-15 NOTE — Progress Notes (Addendum)
     SUBJECTIVE: NO chest pain or SOB.   BP 169/62  Pulse 62  Temp(Src) 97.9 F (36.6 C) (Oral)  Resp 20  Ht 5\' 7"  (1.702 m)  Wt 178 lb 2.1 oz (80.8 kg)  BMI 27.89 kg/m2  SpO2 99%  Intake/Output Summary (Last 24 hours) at 12/15/13 0657 Last data filed at 12/15/13 0200  Gross per 24 hour  Intake 1182.2 ml  Output    500 ml  Net  682.2 ml    PHYSICAL EXAM General: Well developed, well nourished, in no acute distress. Alert and oriented x 3.  Psych:  Good affect, responds appropriately Neck: No JVD. No masses noted.  Lungs: Clear bilaterally with no wheezes or rhonci noted.  Heart: RRR with no murmurs noted. Abdomen: Bowel sounds are present. Soft, non-tender.  Extremities: No lower extremity edema.   LABS: Basic Metabolic Panel:  Recent Labs  16/01/9607/18/15 0437 12/15/13 0303  NA 141 139  K 4.3 3.9  CL 107 106  CO2 20 19  GLUCOSE 148* 130*  BUN 31* 27*  CREATININE 1.55* 1.46*  CALCIUM 9.0 9.0   CBC:  Recent Labs  12/14/13 0437 12/15/13 0303  WBC 6.9 8.4  HGB 11.5* 11.4*  HCT 34.6* 33.3*  MCV 91.5 91.0  PLT 246 234   Cardiac Enzymes:  Recent Labs  12/13/13 0001 12/13/13 0632 12/13/13 1020  TROPONINI <0.30 <0.30 <0.30   Fasting Lipid Panel: No results found for this basename: CHOL, HDL, LDLCALC, TRIG, CHOLHDL, LDLDIRECT,  in the last 72 hours  Current Meds: . aspirin EC  81 mg Oral Daily  . carvedilol  6.25 mg Oral BID WC  . clopidogrel  75 mg Oral Q breakfast  . fenofibrate  54 mg Oral Daily  . insulin aspart  10 Units Subcutaneous BID AC  . insulin aspart  12 Units Subcutaneous QAC lunch  . insulin glargine  22 Units Subcutaneous QHS  . lisinopril  2.5 mg Oral Daily  . pantoprazole  40 mg Oral Daily  . simvastatin  40 mg Oral q1800  . sodium chloride  3 mL Intravenous Q12H     ASSESSMENT AND PLAN:  1. Unstable angina/CAD: Pt admitted with unstable angina. Cardiac cath 12/14/13 per Dr. Swazil[place]Jordan and found to have severe stenosis mid  Circumflex treated with DES x 1. She is stable post PCI. Will continue dual antiplatelet therapy with ASA and Plavix for one year. Continue beta blocker and statin. Discharge home today.  Follow up in our [address] office with me or office PA/NP in 3-4 weeks. I will make arrangements for f/u.   [name]  8/19/20156:57 AM

## 2013-12-15 NOTE — Discharge Summary (Signed)
Physician Discharge Summary  [name] MLY:[id number] DOB: December 27, 1933 DOA: 12/12/2013  PCP: Penni Homans, MD  Admit date: 12/12/2013 Discharge date: 12/15/2013  Time spent: >35 minutes  Recommendations for Outpatient Follow-up:  follow up with cardiologist in 3-4 week s follow up with primary care doctor in 1-2 weeks  Discharge Diagnoses:  Principal Problem:   Chest pain Active Problems:   DIABETES MELLITUS, TYPE II   HYPERTENSION   Unstable angina   Discharge Condition: stable   Diet recommendation: DM, heart healthy   Filed Weights   12/13/13 0114 12/14/13 0519 12/15/13 0610  Weight: 81.239 kg (179 lb 1.6 oz) 80.241 kg (176 lb 14.4 oz) 80.8 kg (178 lb 2.1 oz)    History of present illness:  [name] is a 80 y.o. female with PMH of CAD s/p stenting in December 2006 (Last cath was in 2007 w/moderate 3 vessel disease, patent LAD stent and severe stenosis in the small caliber diagonal branches), IDDM with severe peripheral neuropathy, HTN, and HLD, who presents to the ED with chest pain.   Hospital Course:  1. Unstable angina/CAD: Cardiac cath 12/14/13 per Dr. Martinique and found to have severe stenosis mid Circumflex treated with DES x 1.  -She is stable post PCI. continue dual antiplatelet therapy with ASA and Plavix, beta blocker and statin -f/u with cardiologist in 3-4 weeks  2. DIABETES MELLITUS, TYPE II  - cont Lantus and Novolog  3. HYPERTENSION  -cont Coreg, Lisinopril    Procedures:  LHC  (i.e. Studies not automatically included, echos, thoracentesis, etc; not x-rays)  Consultations:  Cardiology   Discharge Exam: Filed Vitals:   12/15/13 0610  BP: 169/62  Pulse: 62  Temp: 97.9 F (36.6 C)  Resp: 20    General: alert Cardiovascular: s1,s2 rrr Respiratory: CTA BL  Discharge Instructions  Discharge Instructions   Diet - low sodium heart healthy    Complete by:  As directed      Discharge instructions    Complete by:  As directed    Please follow up with cardiologist in 3-4 week s Please follow up with primary care doctor in 1-2 weeks     Increase activity slowly    Complete by:  As directed             Medication List    STOP taking these medications       nitroGLYCERIN 0.4 MG SL tablet  Commonly known as:  NITROSTAT  Replaced by:  nitroGLYCERIN 0.4 MG/SPRAY spray      TAKE these medications       aspirin EC 81 MG tablet  Take 81 mg by mouth daily.     calcium carbonate 500 MG chewable tablet  Commonly known as:  TUMS - dosed in mg elemental calcium  Chew 1 tablet by mouth 3 (three) times daily as needed. For acid reflux     carvedilol 6.25 MG tablet  Commonly known as:  COREG  Take 1 tablet (6.25 mg total) by mouth 2 (two) times daily with a meal.     clopidogrel 75 MG tablet  Commonly known as:  PLAVIX  Take 1 tablet (75 mg total) by mouth daily with breakfast.     fenofibrate 54 MG tablet  Take 1 tablet (54 mg total) by mouth daily.     glucose blood test strip  ONE TOUCH ULTRA TEST STRIP. Use as instructed to check blood sugar twice a day.  Dx 250.00     insulin aspart  100 UNIT/ML injection  Commonly known as:  novoLOG  Inject 10-12 Units into the skin See admin instructions. Take 10 units before breakfast, 12 units before lunch and 10 units before dinner.     insulin glargine 100 UNIT/ML injection  Commonly known as:  LANTUS  Inject 22-26 Units into the skin at bedtime. Bedtime range dosed per blood sugar reading.     Insulin Pen Needle 31G X 8 MM Misc  1 each by Does not apply route 2 (two) times daily before a meal.     lisinopril 2.5 MG tablet  Commonly known as:  PRINIVIL,ZESTRIL  Take 2.5 mg by mouth daily.     lovastatin 40 MG tablet  Commonly known as:  MEVACOR  Take 40 mg by mouth at bedtime.     nitroGLYCERIN 0.4 MG/SPRAY spray  Commonly known as:  NITROLINGUAL  Place 1 spray under the tongue every 5 (five) minutes x 3 doses as needed for chest pain.     ONE TOUCH  ULTRA MINI W/DEVICE Kit  Use to check blood sugar twice a day.  DX 250.00     pantoprazole 40 MG tablet  Commonly known as:  PROTONIX  Take 1 tablet (40 mg total) by mouth daily.       Allergies  Allergen Reactions  . Pioglitazone     REACTION: Edema Actos.  . Sulfonamide Derivatives     hives       Follow-up Information   Follow up with Lauree Chandler, MD. (office wil call you)    Specialty:  Cardiology   Contact information:   [place] [place] [address] [phone]       Follow up with Penni Homans, MD In 1 week.   Specialty:  Family Medicine   Contact information:   [place] [address] [phone]        The results of significant diagnostics from this hospitalization (including imaging, microbiology, ancillary and laboratory) are listed below for reference.    Significant Diagnostic Studies: Dg Chest 2 View  12/12/2013   CLINICAL DATA:  Chest pain  EXAM: CHEST  2 VIEW  COMPARISON:  01/06/2011  FINDINGS: Normal heart size and mediastinal contours. Coronary stent noted, likely in the left circulation. There is no edema, consolidation, effusion, or pneumothorax. Minimal scarring present at the peripheral left base. Cholecystectomy changes.  IMPRESSION: No evidence of active disease.   Electronically Signed   By: Jorje Guild M.D.   On: 12/12/2013 22:25   Nm Myocar Multi W/spect W/wall Motion / Ef  12/13/2013   ADDENDUM REPORT: 12/13/2013 17:14  ADDENDUM: Findings were discussed with Dr. Debara Pickett. As mentioned in the above report the reversibility is seen on the polar map images only. The reversible changes are not appreciated on the multi planer SPECT images. If there is a low to intermediate clinical probability for myocardial ischemiathe polar map findings may reflect non-uniform soft tissue attenuation artifact.   Electronically Signed   By: Kerby Moors M.D.   On: 12/13/2013 17:14   12/13/2013   CLINICAL DATA:   Chest discomfort  EXAM: MYOCARDIAL IMAGING WITH SPECT (REST AND PHARMACOLOGIC-STRESS)  GATED LEFT VENTRICULAR WALL MOTION STUDY  LEFT VENTRICULAR EJECTION FRACTION  TECHNIQUE: Standard myocardial SPECT imaging was performed after resting intravenous injection of 10 mCi Tc-68msestamibi. Subsequently, intravenous infusion of Lexiscan was performed under the supervision of the Cardiology staff. At peak effect of the drug, 30 mCi Tc-960mestamibi was injected  intravenously and standard myocardial SPECT imaging was performed. Quantitative gated imaging was also performed to evaluate left ventricular wall motion, and estimate left ventricular ejection fraction.  COMPARISON:  None.  FINDINGS: Multi planer SPECT: On the polar map images there is a medium size focus of moderate reversibility involving the middle segments of the anterior and posterior lateral wall. This has a summed difference score of 5.  Gated SPECT:  Normal left ventricular wall motion and thickening.  The left ventricular ejection fraction: The left ventricular ejection fraction is equal to 83%.  IMPRESSION: 1. There is moderate reversibility involving the proximal segments of the anterior posterior or lateral wall with a summed difference score equal to 5 Findings may reflect ischemia in the distribution of the left circumflex artery. 2. Normal left ventricular systolic function. 3. Left ventricular ejection fraction equals 83 %  Electronically Signed: By: Kerby Moors M.D. On: 12/13/2013 16:30    Microbiology: No results found for this or any previous visit (from the past 240 hour(s)).   Labs: Basic Metabolic Panel:  Recent Labs Lab 12/12/13 2159 12/13/13 0632 12/14/13 0437 12/15/13 0303  NA 137 141 141 139  K 4.4 4.4 4.3 3.9  CL 102 105 107 106  CO2 21 22 20 19   GLUCOSE 193* 134* 148* 130*  BUN 32* 32* 31* 27*  CREATININE 1.56* 1.60* 1.55* 1.46*  CALCIUM 9.5 9.6 9.0 9.0   Liver Function Tests: No results found for this  basename: AST, ALT, ALKPHOS, BILITOT, PROT, ALBUMIN,  in the last 168 hours No results found for this basename: LIPASE, AMYLASE,  in the last 168 hours No results found for this basename: AMMONIA,  in the last 168 hours CBC:  Recent Labs Lab 12/12/13 2159 12/13/13 0632 12/14/13 0437 12/15/13 0303  WBC 10.0 6.8 6.9 8.4  HGB 12.7 11.6* 11.5* 11.4*  HCT 38.5 35.1* 34.6* 33.3*  MCV 93.0 92.9 91.5 91.0  PLT 273 244 246 234   Cardiac Enzymes:  Recent Labs Lab 12/12/13 2159 12/13/13 0001 12/13/13 0632 12/13/13 1020  TROPONINI <0.30 <0.30 <0.30 <0.30   BNP: BNP (last 3 results) No results found for this basename: PROBNP,  in the last 8760 hours CBG:  Recent Labs Lab 12/13/13 2101 12/14/13 0722 12/14/13 1150 12/14/13 1624 12/14/13 2140  GLUCAP 163* 157* 127* 173* 129*       Signed:  Rowe Clack N  [facility] 12/15/2013, 7:20 AM

## 2014-01-10 ENCOUNTER — Ambulatory Visit (INDEPENDENT_AMBULATORY_CARE_PROVIDER_SITE_OTHER): Payer: Medicare Other | Admitting: Cardiovascular Disease

## 2014-01-10 ENCOUNTER — Telehealth: Payer: Self-pay | Admitting: Cardiovascular Disease

## 2014-01-10 ENCOUNTER — Encounter: Payer: Self-pay | Admitting: Cardiovascular Disease

## 2014-01-10 VITALS — BP 140/70 | HR 63 | Ht 67.0 in | Wt 178.0 lb

## 2014-01-10 DIAGNOSIS — I2510 Atherosclerotic heart disease of native coronary artery without angina pectoris: Secondary | ICD-10-CM

## 2014-01-10 DIAGNOSIS — I10 Essential (primary) hypertension: Secondary | ICD-10-CM

## 2014-01-10 DIAGNOSIS — E785 Hyperlipidemia, unspecified: Secondary | ICD-10-CM

## 2014-01-10 NOTE — Telephone Encounter (Signed)
Spoke with pt. She reports off and on chest and left shoulder pain since stent placement. She will take tylenol and TUMS and this usually helps. Pain in shoulder is in middle of upper arm area. No radiation. Seems worse when she moves it. Noticed it today after playing with grandchild yesterday. Last episode of chest pain was yesterday while sitting at church. Episodes last from minutes to seconds.  Went away on it's own.  Symptoms different than what she had when she recently presented to ED. She is not having shoulder or chest pain at this time. She has scheduled appt this Friday with Tereso Newcomer, PA but she is concerned about recent pain. Appt made for pt to see Dr. Clifton James today at 9:45.

## 2014-01-10 NOTE — Patient Instructions (Signed)
Your physician recommends that you schedule a follow-up appointment in: 3-4 months. 

## 2014-01-10 NOTE — Progress Notes (Signed)
History of Present Illness: 80 yo WF with a history of CAD status post previous Taxus LAD stent Dec 2006, HTN, HLD, neuropathy and diabetes who is here today for cardiac follow up. She has been followed in the past by Dr. Haroldine Laws. She had done well with medical therapy alone but admitted to [facility] August 2015 with chest pain. Cardiac cath 12/14/13 per Dr. Martinique and found to have severe mid Circumflex stenosis. A 2.25 x 28 mm Promus DES was placed in the mid Circumflex. She was noted to have moderate RCA disease, mild LAD disease and moderate to severe small caliber diagonal disease.   She is here today for follow up. She has been doing well but added onto my schedule today because of mild left sided chest pressure occurring once yesterday and once this am. Lasts for several seconds. No associated SOB, palpitations, near syncope or syncope. This does not feel like her pain before her stent was placed.   Primary Care Physician: Dr. Elizebeth Koller  Last Lipid Profile:Lipid Panel     Component Value Date/Time   CHOL 159 02/01/2013 1539   TRIG 132 02/01/2013 1539   HDL 47 02/01/2013 1539   CHOLHDL 3.4 02/01/2013 1539   VLDL 26 02/01/2013 1539   [place] 86 02/01/2013 1539     Past Medical History  Diagnosis Date  . Hypertension   . Hyperlipidemia   . GERD (gastroesophageal reflux disease)   . Anemia   . CAD (coronary artery disease)     status Taxus drug-eluding stent to the LAD December 2006  . Edema     lower extremity with normal BNP  . Encounter for long-term (current) use of medications   . Hyperkalemia   . Glossitis   . Leg pain, left   . Routine general medical examination at a health care facility   . Chronic renal disease     mild  . Pyuria   . Anemia, iron deficiency   . UTI (urinary tract infection)   . Pain in foot     bilateral  . Osteopenia   . Diverticulosis of colon   . Diabetes mellitus type II   . Polyneuropathy   . Dermatitis 11/23/2012    Past Surgical History    Procedure Laterality Date  . Cholecystectomy    . Total abdominal hysterectomy  1969    no BSO  . Ptca      stent to LAD in 03/2005, recatheterization inJanuarry 2007 for recurrent chest pain  that showed a patent LAD stent with normal LV function  . Esophagogastroduodenoscopy  04/15/2006  . Cardiovascular stress test  02/24/2006  . Electrocardiogram  06/18/2006    Current Outpatient Prescriptions  Medication Sig Dispense Refill  . aspirin EC 81 MG tablet Take 81 mg by mouth daily.      . Blood Glucose Monitoring Suppl (ONE TOUCH ULTRA MINI) W/DEVICE KIT Use to check blood sugar twice a day.  DX 250.00  1 each  0  . calcium carbonate (TUMS - DOSED IN MG ELEMENTAL CALCIUM) 500 MG chewable tablet Chew 1 tablet by mouth 3 (three) times daily as needed. For acid reflux       . carvedilol (COREG) 6.25 MG tablet Take 1 tablet (6.25 mg total) by mouth 2 (two) times daily with a meal.  180 tablet  1  . clopidogrel (PLAVIX) 75 MG tablet Take 1 tablet (75 mg total) by mouth daily with breakfast.  30 tablet  1  . fenofibrate 54  MG tablet Take 1 tablet (54 mg total) by mouth daily.  90 tablet  1  . glucose blood test strip ONE TOUCH ULTRA TEST STRIP. Use as instructed to check blood sugar twice a day.  Dx 250.00      . insulin aspart (NOVOLOG) 100 UNIT/ML injection Inject 10-12 Units into the skin See admin instructions. Take 10 units before breakfast, 12 units before lunch and 10 units before dinner.      . insulin glargine (LANTUS) 100 UNIT/ML injection Inject 22-26 Units into the skin at bedtime. Bedtime range dosed per blood sugar reading.      . Insulin Pen Needle 31G X 8 MM MISC 1 each by Does not apply route 2 (two) times daily before a meal.  100 each  1  . lovastatin (MEVACOR) 40 MG tablet Take 40 mg by mouth at bedtime.      . nitroGLYCERIN (NITROSTAT) 0.4 MG SL tablet Place 0.4 mg under the tongue every 5 (five) minutes as needed for chest pain.      . pantoprazole (PROTONIX) 40 MG tablet  Take 1 tablet (40 mg total) by mouth daily.  30 tablet  0  . lisinopril (PRINIVIL,ZESTRIL) 2.5 MG tablet Take 2.5 mg by mouth daily.       No current facility-administered medications for this visit.    Allergies  Allergen Reactions  . Pioglitazone     REACTION: Edema Actos.  . Sulfonamide Derivatives     hives    History   Social History  . Marital Status: Married    Spouse Name: N/A    Number of Children: N/A  . Years of Education: N/A   Occupational History  . Not on file.   Social History Main Topics  . Smoking status: Never Smoker   . Smokeless tobacco: Never Used  . Alcohol Use: No  . Drug Use: No  . Sexual Activity: Not on file   Other Topics Concern  . Not on file   Social History Narrative   retired - from part- time gov work   married- 43 years   Never Smoked   Alcohol use-no     3 children              Family History  Problem Relation Age of Onset  . Heart disease Mother   . Heart disease Sister     Review of Systems:  As stated in the HPI and otherwise negative.   BP 140/70  Pulse 63  Ht 5' 7"  (1.702 m)  Wt 178 lb (80.74 kg)  BMI 27.87 kg/m2  Physical Examination: General: Well developed, well nourished, NAD HEENT: OP clear, mucus membranes moist SKIN: warm, dry. No rashes. Neuro: No focal deficits Musculoskeletal: Muscle strength 5/5 all ext Psychiatric: Mood and affect normal Neck: No JVD, no carotid bruits, no thyromegaly, no lymphadenopathy. Lungs:Clear bilaterally, no wheezes, rhonci, crackles Cardiovascular: Regular rate and rhythm. No murmurs, gallops or rubs. Abdomen:Soft. Bowel sounds present. Non-tender.  Extremities: No lower extremity edema. Pulses are 2 + in the bilateral DP/PT.  Cardiac cath 12/14/13: Left mainstem: Normal.  Left anterior descending (LAD): The stent in the mid LAD has diffuse 20-30% in stent disease. The first diagonal is small to moderate in size with 80% proximal stenosis. The second diagonal is  tiny with 95% ostial disease.  Left circumflex (LCx): The LCx gives rise to a single OM branch. There is a 99% stenosis in the mid LCx.  Right coronary artery (RCA): The  RCA is a dominant vessel. There is diffuse disease in the mid vessel to 50%. The distal vessel is diffusely diseased to 40%. The PDA has a 50-70% stenosis in the proximal vessel. There is 50-60% disease prior to [facility].  Left ventriculography: not done  PCI Note: Following the diagnostic procedure, the decision was made to proceed with PCI of the LCx. Plavix 600 mg was given orally. Weight-based bivalirudin was given for anticoagulation. Once a therapeutic ACT was achieved, a 6 [place] XBLAD 3.5 guide catheter was inserted. A prowater coronary guidewire was used to cross the lesion. The lesion was predilated with a 2.0 mm balloon. The lesion was then stented with a 2.25 x 28 mm Promus stent. The stent was postdilated with a 2.5 mm noncompliant balloon. Following PCI, there was 0% residual stenosis and TIMI-3 flow. Final angiography confirmed an excellent result. The patient tolerated the procedure well. There were no immediate procedural complications. A TR band was used for radial hemostasis. The patient was transferred to the post catheterization recovery area for further monitoring.  Assessment and Plan:   1. CAD: Stable. Her chest pain seems non-cardiac. She is on good medical therapy.  2. HTN: BP well controlled. No changes.   3. Hyperlipidemia: Lipids are well controlled. Continue statin.

## 2014-01-10 NOTE — Telephone Encounter (Signed)
New Message  Pt called states that she has pain in her left shoulder. Not severe and she requests a call back to determine if it is normal please call.

## 2014-01-14 ENCOUNTER — Encounter: Payer: Medicare Other | Admitting: Physician Assistant

## 2014-02-08 ENCOUNTER — Other Ambulatory Visit: Payer: Self-pay

## 2014-02-08 MED ORDER — CLOPIDOGREL BISULFATE 75 MG PO TABS: 75.0000 mg | ORAL_TABLET | Freq: Every day | ORAL | Status: DC

## 2014-04-07 ENCOUNTER — Encounter (HOSPITAL_COMMUNITY): Payer: Self-pay | Admitting: Cardiology

## 2014-05-23 ENCOUNTER — Ambulatory Visit: Payer: Medicare Other | Admitting: Cardiovascular Disease

## 2014-07-05 ENCOUNTER — Encounter: Payer: Self-pay | Admitting: Cardiovascular Disease

## 2014-07-05 ENCOUNTER — Ambulatory Visit (INDEPENDENT_AMBULATORY_CARE_PROVIDER_SITE_OTHER): Payer: Medicare Other | Admitting: Cardiovascular Disease

## 2014-07-05 VITALS — BP 137/78 | HR 68 | Ht 67.0 in | Wt 177.0 lb

## 2014-07-05 DIAGNOSIS — I2510 Atherosclerotic heart disease of native coronary artery without angina pectoris: Secondary | ICD-10-CM

## 2014-07-05 DIAGNOSIS — I10 Essential (primary) hypertension: Secondary | ICD-10-CM

## 2014-07-05 DIAGNOSIS — E785 Hyperlipidemia, unspecified: Secondary | ICD-10-CM

## 2014-07-05 NOTE — Progress Notes (Signed)
History of Present Illness: [name] with a history of CAD status post previous Taxus LAD stent Dec 2006, HTN, HLD, neuropathy and diabetes who is here today for cardiac follow up. She has been followed in the past by Dr. Haroldine Laws. She had done well with medical therapy alone but admitted to [facility] August 2015 with chest pain. Cardiac cath 12/14/13 per Dr. Martinique and found to have severe mid Circumflex stenosis. A 2.25 x 28 mm Promus DES was placed in the mid Circumflex. She was noted to have moderate RCA disease, mild LAD disease and moderate to severe small caliber diagonal disease. I saw her as the DOD on 01/10/14 and she had c/o mild left sided chest pressure that lasted for several seconds. This was felt to be non-cardiac.   She is here today for follow up. No chest pain, SOB, palpitations, near syncope or syncope.   Primary Care Physician: Dr. Earley Brooke  Last Lipid Profile: Followed in primary care  Past Medical History  Diagnosis Date  . Hypertension   . Hyperlipidemia   . GERD (gastroesophageal reflux disease)   . Anemia   . CAD (coronary artery disease)     status Taxus drug-eluding stent to the LAD December 2006  . Edema     lower extremity with normal BNP  . Encounter for long-term (current) use of medications   . Hyperkalemia   . Glossitis   . Leg pain, left   . Routine general medical examination at a health care facility   . Chronic renal disease     mild  . Pyuria   . Anemia, iron deficiency   . UTI (urinary tract infection)   . Pain in foot     bilateral  . Osteopenia   . Diverticulosis of colon   . Diabetes mellitus type II   . Polyneuropathy   . Dermatitis 11/23/2012    Past Surgical History  Procedure Laterality Date  . Cholecystectomy    . Total abdominal hysterectomy  1969    no BSO  . Ptca      stent to LAD in 03/2005, recatheterization inJanuarry 2007 for recurrent chest pain  that showed a patent LAD stent with normal LV function  .  Esophagogastroduodenoscopy  04/15/2006  . Cardiovascular stress test  02/24/2006  . Electrocardiogram  06/18/2006  . Left heart catheterization with coronary angiogram N/A 12/14/2013    Procedure: LEFT HEART CATHETERIZATION WITH CORONARY ANGIOGRAM;  Surgeon: Peter M Martinique, MD;  Location: [facility] CATH LAB;  Service: Cardiovascular;  Laterality: N/A;    Current Outpatient Prescriptions  Medication Sig Dispense Refill  . aspirin EC 81 MG tablet Take 81 mg by mouth daily.    . Blood Glucose Monitoring Suppl (ONE TOUCH ULTRA MINI) W/DEVICE KIT Use to check blood sugar twice a day.  DX 250.00 1 each 0  . calcium carbonate (TUMS - DOSED IN MG ELEMENTAL CALCIUM) 500 MG chewable tablet Chew 1 tablet by mouth 3 (three) times daily as needed. For acid reflux     . carvedilol (COREG) 6.25 MG tablet Take 1 tablet (6.25 mg total) by mouth 2 (two) times daily with a meal. 180 tablet 1  . clopidogrel (PLAVIX) 75 MG tablet Take 1 tablet (75 mg total) by mouth daily with breakfast. 90 tablet 3  . fenofibrate 54 MG tablet Take 1 tablet (54 mg total) by mouth daily. 90 tablet 1  . glucose blood test strip ONE TOUCH ULTRA TEST STRIP. Use as instructed  to check blood sugar twice a day.  Dx 250.00    . insulin aspart (NOVOLOG) 100 UNIT/ML injection Inject 10-12 Units into the skin See admin instructions. Take 10 units before breakfast, 12 units before lunch and 10 units before dinner.    . insulin glargine (LANTUS) 100 UNIT/ML injection Inject 22-26 Units into the skin at bedtime. Bedtime range dosed per blood sugar reading.    . Insulin Pen Needle 31G X 8 MM MISC 1 each by Does not apply route 2 (two) times daily before a meal. 100 each 1  . lisinopril (PRINIVIL,ZESTRIL) 2.5 MG tablet Take 2.5 mg by mouth daily.    Marland Kitchen lovastatin (MEVACOR) 40 MG tablet Take 40 mg by mouth at bedtime.    . nitroGLYCERIN (NITROSTAT) 0.4 MG SL tablet Place 0.4 mg under the tongue every 5 (five) minutes as needed for chest pain.     No current  facility-administered medications for this visit.    Allergies  Allergen Reactions  . Pioglitazone     REACTION: Edema Actos.  . Sulfonamide Derivatives     hives    History   Social History  . Marital Status: Married    Spouse Name: N/A  . Number of Children: N/A  . Years of Education: N/A   Occupational History  . Not on file.   Social History Main Topics  . Smoking status: Never Smoker   . Smokeless tobacco: Never Used  . Alcohol Use: No  . Drug Use: No  . Sexual Activity: Not on file   Other Topics Concern  . Not on file   Social History Narrative   retired - from part- time gov work   married- 26 years   Never Smoked   Alcohol use-no     3 children              Family History  Problem Relation Age of Onset  . Heart disease Mother   . Heart disease Sister     Review of Systems:  As stated in the HPI and otherwise negative.   BP 137/78 mmHg  Pulse 68  Ht 5' 7"  (1.702 m)  Wt 177 lb (80.287 kg)  BMI 27.72 kg/m2  Physical Examination: General: Well developed, well nourished, NAD HEENT: OP clear, mucus membranes moist SKIN: warm, dry. No rashes. Neuro: No focal deficits Musculoskeletal: Muscle strength 5/5 all ext Psychiatric: Mood and affect normal Neck: No JVD, no carotid bruits, no thyromegaly, no lymphadenopathy. Lungs:Clear bilaterally, no wheezes, rhonci, crackles Cardiovascular: Regular rate and rhythm. No murmurs, gallops or rubs. Abdomen:Soft. Bowel sounds present. Non-tender.  Extremities: No lower extremity edema. Pulses are 2 + in the bilateral DP/PT.  Cardiac cath 12/14/13: Left mainstem: Normal.  Left anterior descending (LAD): The stent in the mid LAD has diffuse 20-30% in stent disease. The first diagonal is small to moderate in size with 80% proximal stenosis. The second diagonal is tiny with 95% ostial disease.  Left circumflex (LCx): The LCx gives rise to a single OM branch. There is a 99% stenosis in the mid LCx.  Right  coronary artery (RCA): The RCA is a dominant vessel. There is diffuse disease in the mid vessel to 50%. The distal vessel is diffusely diseased to 40%. The PDA has a 50-70% stenosis in the proximal vessel. There is 50-60% disease prior to [facility].  Left ventriculography: not done  PCI Note: Following the diagnostic procedure, the decision was made to proceed with PCI of the LCx. Plavix 600  mg was given orally. Weight-based bivalirudin was given for anticoagulation. Once a therapeutic ACT was achieved, a 6 [place] XBLAD 3.5 guide catheter was inserted. A prowater coronary guidewire was used to cross the lesion. The lesion was predilated with a 2.0 mm balloon. The lesion was then stented with a 2.25 x 28 mm Promus stent. The stent was postdilated with a 2.5 mm noncompliant balloon. Following PCI, there was 0% residual stenosis and TIMI-3 flow. Final angiography confirmed an excellent result. The patient tolerated the procedure well. There were no immediate procedural complications. A TR band was used for radial hemostasis. The patient was transferred to the post catheterization recovery area for further monitoring.  Assessment and Plan:   1. CAD: Stable. No recent chest pains. She is on good medical therapy. Will continue ASA, Plavix, statin, Ace-inh.   2. HTN: BP well controlled. No changes.   3. Hyperlipidemia: Lipids are well controlled per pt and followed in primary care. Continue statin.

## 2014-07-05 NOTE — Patient Instructions (Signed)
Your physician wants you to follow-up in:  6 months. You will receive a reminder letter in the mail two months in advance. If you don't receive a letter, please call our office to schedule the follow-up appointment.   

## 2014-07-17 ENCOUNTER — Encounter (HOSPITAL_COMMUNITY): Payer: Self-pay | Admitting: *Deleted

## 2014-07-17 ENCOUNTER — Emergency Department (HOSPITAL_COMMUNITY)
Admission: EM | Admit: 2014-07-17 | Discharge: 2014-07-17 | Disposition: A | Discharge status: Home / Self Care | Payer: Medicare Other | Attending: Emergency Medicine | Admitting: Emergency Medicine

## 2014-07-17 DIAGNOSIS — Z9049 Acquired absence of other specified parts of digestive tract: Secondary | ICD-10-CM | POA: Diagnosis not present

## 2014-07-17 DIAGNOSIS — I129 Hypertensive chronic kidney disease with stage 1 through stage 4 chronic kidney disease, or unspecified chronic kidney disease: Secondary | ICD-10-CM | POA: Insufficient documentation

## 2014-07-17 DIAGNOSIS — Z90710 Acquired absence of both cervix and uterus: Secondary | ICD-10-CM | POA: Diagnosis not present

## 2014-07-17 DIAGNOSIS — Z872 Personal history of diseases of the skin and subcutaneous tissue: Secondary | ICD-10-CM | POA: Insufficient documentation

## 2014-07-17 DIAGNOSIS — N390 Urinary tract infection, site not specified: Secondary | ICD-10-CM | POA: Insufficient documentation

## 2014-07-17 DIAGNOSIS — Z8719 Personal history of other diseases of the digestive system: Secondary | ICD-10-CM | POA: Diagnosis not present

## 2014-07-17 DIAGNOSIS — I2510 Atherosclerotic heart disease of native coronary artery without angina pectoris: Secondary | ICD-10-CM | POA: Insufficient documentation

## 2014-07-17 DIAGNOSIS — Z862 Personal history of diseases of the blood and blood-forming organs and certain disorders involving the immune mechanism: Secondary | ICD-10-CM | POA: Insufficient documentation

## 2014-07-17 DIAGNOSIS — Z7982 Long term (current) use of aspirin: Secondary | ICD-10-CM | POA: Diagnosis not present

## 2014-07-17 DIAGNOSIS — Z794 Long term (current) use of insulin: Secondary | ICD-10-CM | POA: Diagnosis not present

## 2014-07-17 DIAGNOSIS — E785 Hyperlipidemia, unspecified: Secondary | ICD-10-CM | POA: Insufficient documentation

## 2014-07-17 DIAGNOSIS — E119 Type 2 diabetes mellitus without complications: Secondary | ICD-10-CM | POA: Diagnosis not present

## 2014-07-17 DIAGNOSIS — Z7902 Long term (current) use of antithrombotics/antiplatelets: Secondary | ICD-10-CM | POA: Insufficient documentation

## 2014-07-17 DIAGNOSIS — K219 Gastro-esophageal reflux disease without esophagitis: Secondary | ICD-10-CM | POA: Diagnosis not present

## 2014-07-17 DIAGNOSIS — N189 Chronic kidney disease, unspecified: Secondary | ICD-10-CM | POA: Insufficient documentation

## 2014-07-17 DIAGNOSIS — R109 Unspecified abdominal pain: Secondary | ICD-10-CM | POA: Diagnosis present

## 2014-07-17 LAB — URINALYSIS, ROUTINE W REFLEX MICROSCOPIC
Bilirubin Urine: NEGATIVE
Glucose, UA: NEGATIVE mg/dL
HGB URINE DIPSTICK: NEGATIVE
Ketones, ur: NEGATIVE mg/dL
NITRITE: NEGATIVE
Protein, ur: NEGATIVE mg/dL
Specific Gravity, Urine: 1.016 (ref 1.005–1.030)
UROBILINOGEN UA: 0.2 mg/dL (ref 0.0–1.0)
pH: 5 (ref 5.0–8.0)

## 2014-07-17 LAB — CBC WITH DIFFERENTIAL/PLATELET
Basophils Absolute: 0 10*3/uL (ref 0.0–0.1)
Basophils Relative: 0 % (ref 0–1)
EOS ABS: 0.1 10*3/uL (ref 0.0–0.7)
Eosinophils Relative: 2 % (ref 0–5)
HEMATOCRIT: 35.6 % — AB (ref 36.0–46.0)
Hemoglobin: 11.4 g/dL — ABNORMAL LOW (ref 12.0–15.0)
Lymphocytes Relative: 31 % (ref 12–46)
Lymphs Abs: 1.2 10*3/uL (ref 0.7–4.0)
MCH: 29.9 pg (ref 26.0–34.0)
MCHC: 32 g/dL (ref 30.0–36.0)
MCV: 93.4 fL (ref 78.0–100.0)
Monocytes Absolute: 0.5 10*3/uL (ref 0.1–1.0)
Monocytes Relative: 14 % — ABNORMAL HIGH (ref 3–12)
NEUTROS ABS: 2 10*3/uL (ref 1.7–7.7)
NEUTROS PCT: 53 % (ref 43–77)
Platelets: 250 10*3/uL (ref 150–400)
RBC: 3.81 MIL/uL — ABNORMAL LOW (ref 3.87–5.11)
RDW: 13 % (ref 11.5–15.5)
WBC: 3.8 10*3/uL — ABNORMAL LOW (ref 4.0–10.5)

## 2014-07-17 LAB — COMPREHENSIVE METABOLIC PANEL
ALT: 19 U/L (ref 0–35)
AST: 32 U/L (ref 0–37)
Albumin: 3.3 g/dL — ABNORMAL LOW (ref 3.5–5.2)
Alkaline Phosphatase: 33 U/L — ABNORMAL LOW (ref 39–117)
Anion gap: 9 (ref 5–15)
BUN: 34 mg/dL — ABNORMAL HIGH (ref 6–23)
CO2: 22 mmol/L (ref 19–32)
CREATININE: 1.7 mg/dL — AB (ref 0.50–1.10)
Calcium: 8.7 mg/dL (ref 8.4–10.5)
Chloride: 106 mmol/L (ref 96–112)
GFR calc non Af Amer: 28 mL/min — ABNORMAL LOW (ref >90)
GFR, EST AFRICAN AMERICAN: 32 mL/min — AB (ref >90)
Glucose, Bld: 186 mg/dL — ABNORMAL HIGH (ref 70–99)
POTASSIUM: 4.2 mmol/L (ref 3.5–5.1)
SODIUM: 137 mmol/L (ref 135–145)
Total Bilirubin: 0.5 mg/dL (ref 0.3–1.2)
Total Protein: 6 g/dL (ref 6.0–8.3)

## 2014-07-17 LAB — URINE MICROSCOPIC-ADD ON

## 2014-07-17 MED ORDER — CIPROFLOXACIN HCL 500 MG PO TABS: 500.0000 mg | ORAL_TABLET | Freq: Two times a day (BID) | ORAL | Status: DC

## 2014-07-17 MED ORDER — CIPROFLOXACIN HCL 500 MG PO TABS
500.0000 mg | ORAL_TABLET | Freq: Once | ORAL | Status: AC
  Administered 2014-07-17: 500 mg via ORAL
  Filled 2014-07-17: qty 1

## 2014-07-17 MED ORDER — ONDANSETRON HCL 4 MG/2ML IJ SOLN
4.0000 mg | Freq: Once | INTRAMUSCULAR | Status: AC
  Administered 2014-07-17: 4 mg via INTRAVENOUS
  Filled 2014-07-17: qty 2

## 2014-07-17 NOTE — ED Notes (Signed)
Pt reports chills, low energy and frequent urination. Sts those are usually signs that she is developing UTI

## 2014-07-17 NOTE — ED Provider Notes (Signed)
CSN: [id number]     Arrival date & time 07/17/14  0623 History   First MD Initiated Contact with Patient 07/17/14 [phone]     Chief Complaint  Patient presents with  . Urinary Tract Infection  . Abdominal Pain     (Consider location/radiation/quality/duration/timing/severity/associated sxs/prior Treatment) Patient is a 81 y.o. female presenting with urinary tract infection and abdominal pain. The history is provided by the patient and the spouse.  Urinary Tract Infection This is a new problem. Episode onset: 3 days ago. The problem occurs constantly. The problem has been gradually worsening. Associated symptoms include abdominal pain. Associated symptoms comments: Chills, fatigue, nausea.  Urinary frequency, urgency and dysuria.  No fever or vomiting.  No flank pain. Exacerbated by: Urinating. Nothing relieves the symptoms. She has tried nothing for the symptoms. The treatment provided no relief.  Abdominal Pain   Past Medical History  Diagnosis Date  . Hypertension   . Hyperlipidemia   . GERD (gastroesophageal reflux disease)   . Anemia   . CAD (coronary artery disease)     status Taxus drug-eluding stent to the LAD December 2006  . Edema     lower extremity with normal BNP  . Encounter for long-term (current) use of medications   . Hyperkalemia   . Glossitis   . Leg pain, left   . Routine general medical examination at a health care facility   . Chronic renal disease     mild  . Pyuria   . Anemia, iron deficiency   . UTI (urinary tract infection)   . Pain in foot     bilateral  . Osteopenia   . Diverticulosis of colon   . Diabetes mellitus type II   . Polyneuropathy   . Dermatitis 11/23/2012   Past Surgical History  Procedure Laterality Date  . Cholecystectomy    . Total abdominal hysterectomy  1969    no BSO  . Ptca      stent to LAD in 03/2005, recatheterization inJanuarry 2007 for recurrent chest pain  that showed a patent LAD stent with normal LV function  .  Esophagogastroduodenoscopy  04/15/2006  . Cardiovascular stress test  02/24/2006  . Electrocardiogram  06/18/2006  . Left heart catheterization with coronary angiogram N/A 12/14/2013    Procedure: LEFT HEART CATHETERIZATION WITH CORONARY ANGIOGRAM;  Surgeon: Peter M Martinique, MD;  Location: [facility] CATH LAB;  Service: Cardiovascular;  Laterality: N/A;   Family History  Problem Relation Age of Onset  . Heart disease Mother   . Heart disease Sister    History  Substance Use Topics  . Smoking status: Never Smoker   . Smokeless tobacco: Never Used  . Alcohol Use: No   OB History    No data available     Review of Systems  Gastrointestinal: Positive for abdominal pain.  All other systems reviewed and are negative.     Allergies  Pioglitazone and Sulfonamide derivatives  Home Medications   Prior to Admission medications   Medication Sig Start Date End Date Taking? Authorizing Provider  aspirin EC 81 MG tablet Take 81 mg by mouth daily.    Historical Provider, MD  Blood Glucose Monitoring Suppl (ONE TOUCH ULTRA MINI) W/DEVICE KIT Use to check blood sugar twice a day.  DX 250.00 03/24/13   Mosie Lukes, MD  calcium carbonate (TUMS - DOSED IN MG ELEMENTAL CALCIUM) 500 MG chewable tablet Chew 1 tablet by mouth 3 (three) times daily as needed. For acid reflux  Historical Provider, MD  carvedilol (COREG) 6.25 MG tablet Take 1 tablet (6.25 mg total) by mouth 2 (two) times daily with a meal. 12/15/13   Kinnie Feil, MD  clopidogrel (PLAVIX) 75 MG tablet Take 1 tablet (75 mg total) by mouth daily with breakfast. 02/08/14   Burnell Blanks, MD  fenofibrate 54 MG tablet Take 1 tablet (54 mg total) by mouth daily. 11/13/12   Mosie Lukes, MD  glucose blood test strip ONE TOUCH ULTRA TEST STRIP. Use as instructed to check blood sugar twice a day.  Dx 250.00 03/24/13   Mosie Lukes, MD  insulin aspart (NOVOLOG) 100 UNIT/ML injection Inject 10-12 Units into the skin See admin  instructions. Take 10 units before breakfast, 12 units before lunch and 10 units before dinner.    Historical Provider, MD  insulin glargine (LANTUS) 100 UNIT/ML injection Inject 22-26 Units into the skin at bedtime. Bedtime range dosed per blood sugar reading.    Historical Provider, MD  Insulin Pen Needle 31G X 8 MM MISC 1 each by Does not apply route 2 (two) times daily before a meal. 06/27/11   Burnice Logan, MD  lisinopril (PRINIVIL,ZESTRIL) 2.5 MG tablet Take 2.5 mg by mouth daily.    Historical Provider, MD  lovastatin (MEVACOR) 40 MG tablet Take 40 mg by mouth at bedtime.    Historical Provider, MD  nitroGLYCERIN (NITROSTAT) 0.4 MG SL tablet Place 0.4 mg under the tongue every 5 (five) minutes as needed for chest pain.    Historical Provider, MD   BP 107/66 mmHg  Pulse 79  Temp(Src) 97.5 F (36.4 C) (Oral)  Resp 18  SpO2 98% Physical Exam  Constitutional: She is oriented to person, place, and time. She appears well-developed and well-nourished. No distress.  HENT:  Head: Normocephalic and atraumatic.  Mouth/Throat: Oropharynx is clear and moist.  Eyes: Conjunctivae and EOM are normal. Pupils are equal, round, and reactive to light.  Neck: Normal range of motion. Neck supple.  Cardiovascular: Normal rate, regular rhythm and intact distal pulses.   No murmur heard. Pulmonary/Chest: Effort normal and breath sounds normal. No respiratory distress. She has no wheezes. She has no rales.  Abdominal: Soft. She exhibits no distension. There is generalized tenderness. There is no rebound, no guarding and no CVA tenderness.  General abdominal pain but most pronounced in the suprapubic area. Mild guarding in the suprapubic area  Genitourinary: Vagina normal.  Bladder prolapse.  No evidence of discharge or vaginal yeast  Musculoskeletal: Normal range of motion. She exhibits no edema or tenderness.  Neurological: She is alert and oriented to person, place, and time.  Skin: Skin is warm and  dry. No rash noted. No erythema.  Psychiatric: She has a normal mood and affect. Her behavior is normal.  Nursing note and vitals reviewed.   ED Course  Procedures (including critical care time) Labs Review Labs Reviewed  URINALYSIS, ROUTINE W REFLEX MICROSCOPIC - Abnormal; Notable for the following:    APPearance CLOUDY (*)    Leukocytes, UA SMALL (*)    All other components within normal limits  CBC WITH DIFFERENTIAL/PLATELET - Abnormal; Notable for the following:    WBC 3.8 (*)    RBC 3.81 (*)    Hemoglobin 11.4 (*)    HCT 35.6 (*)    Monocytes Relative 14 (*)    All other components within normal limits  COMPREHENSIVE METABOLIC PANEL - Abnormal; Notable for the following:    Glucose, Bld 186 (*)  BUN 34 (*)    Creatinine, Ser 1.70 (*)    Albumin 3.3 (*)    Alkaline Phosphatase 33 (*)    GFR calc non Af Amer 28 (*)    GFR calc Af Amer 32 (*)    All other components within normal limits  URINE MICROSCOPIC-ADD ON - Abnormal; Notable for the following:    Squamous Epithelial / LPF FEW (*)    Bacteria, UA FEW (*)    All other components within normal limits  URINE CULTURE    Imaging Review No results found.   EKG Interpretation None      MDM   Final diagnoses:  UTI (lower urinary tract infection)    Patient presenting with complaint of dysuria, lower abdominal pain, frequency, chills and nausea. No vomiting or fever.. Patient states last blood sugar check today was 150 which is high for her. She has normal vital signs and is otherwise well-appearing. Feel most likely patient has a UTI without symptoms consistent with pyelonephritis. Patient states her last urinary tract infection was 2-3 months ago was treated with an antibiotic which she thinks is Keflex.  12:53 PM Lazarus lightening worsening renal function 1.6-1.7 today. UA with evidence of early infection. Culture sent. Given patient's severe dysuria, frequency, urgency and on exam no evidence of vaginal  Candida will treat for UTI.  Blanchie Dessert, MD 07/17/14 1255

## 2014-07-18 LAB — URINE CULTURE

## 2014-10-28 ENCOUNTER — Other Ambulatory Visit: Payer: Self-pay | Admitting: Family Medicine

## 2014-10-28 ENCOUNTER — Ambulatory Visit
Admission: RE | Admit: 2014-10-28 | Discharge: 2014-10-28 | Disposition: A | Discharge status: Home / Self Care | Payer: Medicare Other | Source: Ambulatory Visit | Attending: Family Medicine | Admitting: Family Medicine

## 2014-10-28 DIAGNOSIS — M7989 Other specified soft tissue disorders: Secondary | ICD-10-CM

## 2014-12-29 ENCOUNTER — Encounter (HOSPITAL_BASED_OUTPATIENT_CLINIC_OR_DEPARTMENT_OTHER): Payer: Self-pay

## 2014-12-29 ENCOUNTER — Emergency Department (HOSPITAL_BASED_OUTPATIENT_CLINIC_OR_DEPARTMENT_OTHER): Payer: Medicare Other

## 2014-12-29 ENCOUNTER — Emergency Department (HOSPITAL_BASED_OUTPATIENT_CLINIC_OR_DEPARTMENT_OTHER)
Admission: EM | Admit: 2014-12-29 | Discharge: 2014-12-29 | Disposition: A | Discharge status: Home / Self Care | Payer: Medicare Other | Attending: Emergency Medicine | Admitting: Emergency Medicine

## 2014-12-29 DIAGNOSIS — M722 Plantar fascial fibromatosis: Secondary | ICD-10-CM | POA: Insufficient documentation

## 2014-12-29 DIAGNOSIS — Z8719 Personal history of other diseases of the digestive system: Secondary | ICD-10-CM | POA: Insufficient documentation

## 2014-12-29 DIAGNOSIS — Z87442 Personal history of urinary calculi: Secondary | ICD-10-CM | POA: Insufficient documentation

## 2014-12-29 DIAGNOSIS — E119 Type 2 diabetes mellitus without complications: Secondary | ICD-10-CM | POA: Insufficient documentation

## 2014-12-29 DIAGNOSIS — I129 Hypertensive chronic kidney disease with stage 1 through stage 4 chronic kidney disease, or unspecified chronic kidney disease: Secondary | ICD-10-CM | POA: Diagnosis not present

## 2014-12-29 DIAGNOSIS — I2510 Atherosclerotic heart disease of native coronary artery without angina pectoris: Secondary | ICD-10-CM | POA: Insufficient documentation

## 2014-12-29 DIAGNOSIS — N182 Chronic kidney disease, stage 2 (mild): Secondary | ICD-10-CM | POA: Insufficient documentation

## 2014-12-29 DIAGNOSIS — Z79899 Other long term (current) drug therapy: Secondary | ICD-10-CM | POA: Diagnosis not present

## 2014-12-29 DIAGNOSIS — Z872 Personal history of diseases of the skin and subcutaneous tissue: Secondary | ICD-10-CM | POA: Diagnosis not present

## 2014-12-29 DIAGNOSIS — E785 Hyperlipidemia, unspecified: Secondary | ICD-10-CM | POA: Insufficient documentation

## 2014-12-29 DIAGNOSIS — Z7982 Long term (current) use of aspirin: Secondary | ICD-10-CM | POA: Diagnosis not present

## 2014-12-29 DIAGNOSIS — Z794 Long term (current) use of insulin: Secondary | ICD-10-CM | POA: Diagnosis not present

## 2014-12-29 DIAGNOSIS — Z862 Personal history of diseases of the blood and blood-forming organs and certain disorders involving the immune mechanism: Secondary | ICD-10-CM | POA: Diagnosis not present

## 2014-12-29 DIAGNOSIS — M79671 Pain in right foot: Secondary | ICD-10-CM | POA: Diagnosis present

## 2014-12-29 DIAGNOSIS — G629 Polyneuropathy, unspecified: Secondary | ICD-10-CM | POA: Insufficient documentation

## 2014-12-29 NOTE — ED Provider Notes (Signed)
CSN: [id number]     Arrival date & time 12/29/14  1333 History   First MD Initiated Contact with Patient 12/29/14 1401     Chief Complaint  Patient presents with  . Foot Pain     (Consider location/radiation/quality/duration/timing/severity/associated sxs/prior Treatment) Patient is a 81 y.o. female presenting with lower extremity pain. The history is provided by the patient. No language interpreter was used.  Foot Pain This is a new problem. The current episode started today. The problem occurs constantly. The problem has been unchanged. Associated symptoms include myalgias. Pertinent negatives include no joint swelling. Nothing aggravates the symptoms. She has tried nothing for the symptoms. The treatment provided moderate relief.  Pt complains of pain to her right heel since yesterday.  Pt denies any injury. Pt has had a bone spur in the past.  Pt reports pain felt like a cramp  Past Medical History  Diagnosis Date  . Hypertension   . Hyperlipidemia   . GERD (gastroesophageal reflux disease)   . Anemia   . CAD (coronary artery disease)     status Taxus drug-eluding stent to the LAD December 2006  . Edema     lower extremity with normal BNP  . Encounter for long-term (current) use of medications   . Hyperkalemia   . Glossitis   . Leg pain, left   . Routine general medical examination at a health care facility   . Chronic renal disease     mild  . Pyuria   . Anemia, iron deficiency   . UTI (urinary tract infection)   . Pain in foot     bilateral  . Osteopenia   . Diverticulosis of colon   . Diabetes mellitus type II   . Polyneuropathy   . Dermatitis 11/23/2012   Past Surgical History  Procedure Laterality Date  . Cholecystectomy    . Total abdominal hysterectomy  1969    no BSO  . Ptca      stent to LAD in 03/2005, recatheterization inJanuarry 2007 for recurrent chest pain  that showed a patent LAD stent with normal LV function  . Esophagogastroduodenoscopy   04/15/2006  . Cardiovascular stress test  02/24/2006  . Electrocardiogram  06/18/2006  . Left heart catheterization with coronary angiogram N/A 12/14/2013    Procedure: LEFT HEART CATHETERIZATION WITH CORONARY ANGIOGRAM;  Surgeon: Peter M Martinique, MD;  Location: [facility] CATH LAB;  Service: Cardiovascular;  Laterality: N/A;   Family History  Problem Relation Age of Onset  . Heart disease Mother   . Heart disease Sister    Social History  Substance Use Topics  . Smoking status: Never Smoker   . Smokeless tobacco: Never Used  . Alcohol Use: No   OB History    No data available     Review of Systems  Musculoskeletal: Positive for myalgias. Negative for joint swelling.  All other systems reviewed and are negative.     Allergies  Pioglitazone and Sulfonamide derivatives  Home Medications   Prior to Admission medications   Medication Sig Start Date End Date Taking? Authorizing Provider  glipiZIDE (GLUCOTROL) 5 MG tablet Take by mouth daily before breakfast.   Yes Historical Provider, MD  aspirin EC 81 MG tablet Take 81 mg by mouth daily.    Historical Provider, MD  Blood Glucose Monitoring Suppl (ONE TOUCH ULTRA MINI) W/DEVICE KIT Use to check blood sugar twice a day.  DX 250.00 03/24/13   Mosie Lukes, MD  calcium carbonate (TUMS - DOSED  IN MG ELEMENTAL CALCIUM) 500 MG chewable tablet Chew 1 tablet by mouth 3 (three) times daily as needed. For acid reflux     Historical Provider, MD  ciprofloxacin (CIPRO) 500 MG tablet Take 1 tablet (500 mg total) by mouth every 12 (twelve) hours. 07/17/14   Blanchie Dessert, MD  clopidogrel (PLAVIX) 75 MG tablet Take 1 tablet (75 mg total) by mouth daily with breakfast. 02/08/14   Burnell Blanks, MD  fenofibrate 54 MG tablet Take 1 tablet (54 mg total) by mouth daily. 11/13/12   Mosie Lukes, MD  glucose blood test strip ONE TOUCH ULTRA TEST STRIP. Use as instructed to check blood sugar twice a day.  Dx 250.00 03/24/13   Mosie Lukes, MD   insulin glargine (LANTUS) 100 UNIT/ML injection Inject 19 Units into the skin at bedtime. Bedtime range dosed per blood sugar reading.    Historical Provider, MD  Insulin Pen Needle 31G X 8 MM MISC 1 each by Does not apply route 2 (two) times daily before a meal. 06/27/11   Burnice Logan, MD  lisinopril (PRINIVIL,ZESTRIL) 2.5 MG tablet Take 2.5 mg by mouth daily.    Historical Provider, MD  lovastatin (MEVACOR) 40 MG tablet Take 40 mg by mouth at bedtime.    Historical Provider, MD  Multiple Vitamin (MULTIVITAMIN WITH MINERALS) TABS tablet Take 1 tablet by mouth daily.    Historical Provider, MD  nitroGLYCERIN (NITROSTAT) 0.4 MG SL tablet Place 0.4 mg under the tongue every 5 (five) minutes as needed for chest pain.    Historical Provider, MD   BP 153/78 mmHg  Pulse 65  Temp(Src) 98 F (36.7 C) (Oral)  Resp 18  Ht 5' 7"  (1.702 m)  Wt 170 lb (77.111 kg)  BMI 26.62 kg/m2  SpO2 99% Physical Exam  Constitutional: She is oriented to person, place, and time. She appears well-developed and well-nourished.  HENT:  Head: Normocephalic.  Eyes: EOM are normal.  Neck: Normal range of motion.  Musculoskeletal: Normal range of motion.  Tender right feel,  Ankle nontender  Neurological: She is alert and oriented to person, place, and time.  Skin: Skin is warm.  Psychiatric: She has a normal mood and affect.  Nursing note and vitals reviewed.   ED Course  Procedures (including critical care time) Labs Review Labs Reviewed - No data to display  Imaging Review Dg Foot Complete Right  12/29/2014   CLINICAL DATA:  81 year old who fell 4 days ago, but had acute onset of lateral right foot pain in heel pain yesterday. Initial encounter.  EXAM: RIGHT FOOT COMPLETE - 3+ VIEW  COMPARISON:  11/27/2008.  FINDINGS: No evidence of acute or subacute fracture or dislocation. Joint spaces well preserved for age, with mild narrowing of the IP joints of several toes and mild narrowing of the medial  cuneiform-1st metatarsal joint. Osseous demineralization, progressive since the prior examination. Very small plantar spur arising from the calcaneus.  IMPRESSION: 1. No acute or subacute osseous abnormality. 2. Mild osteoarthritis involving the IP joints of several toes in the medial cuneiform-1st metatarsal joint. 3. Osseous demineralization, progressive since 2010.   Electronically Signed   By: Evangeline Dakin M.D.   On: 12/29/2014 14:46   I have personally reviewed and evaluated these images and lab results as part of my medical decision-making.   EKG Interpretation None      MDM  Pt reports she is diabetic. She has a history of neuropathy.  She has had a bone  spur in the past.   Final diagnoses:  Plantar fasciitis, right  Neuropathy    Tennis ball massage Follow up with Sueanne Margarita MD    Fransico Meadow, PA-C 12/29/14 [place], MD 01/02/15 [phone]

## 2014-12-29 NOTE — Discharge Instructions (Signed)
Diabetic Neuropathy Diabetic neuropathy is a nerve disease or nerve damage that is caused by diabetes mellitus. About half of all people with diabetes mellitus have some form of nerve damage. Nerve damage is more common in those who have had diabetes mellitus for many years and who generally have not had good control of their blood sugar (glucose) level. Diabetic neuropathy is a common complication of diabetes mellitus. There are three more common types of diabetic neuropathy and a fourth type that is less common and less understood:   Peripheral neuropathy--This is the most common type of diabetic neuropathy. It causes damage to the nerves of the feet and legs first and then eventually the hands and arms.The damage affects the ability to sense touch.  Autonomic neuropathy--This type causes damage to the autonomic nervous system, which controls the following functions:  Heartbeat.  Body temperature.  Blood pressure.  Urination.  Digestion.  Sweating.  Sexual function.  Focal neuropathy--Focal neuropathy can be painful and unpredictable and occurs most often in older adults with diabetes mellitus. It involves a specific nerve or one area and often comes on suddenly. It usually does not cause long-term problems.  Radiculoplexus neuropathy-- Sometimes called lumbosacral radiculoplexus neuropathy, radiculoplexus neuropathy affects the nerves of the thighs, hips, buttocks, or legs. It is more common in people with type 2 diabetes mellitus and in older men. It is characterized by debilitating pain, weakness, and atrophy, usually in the thigh muscles. CAUSES  The cause of peripheral, autonomic, and focal neuropathies is diabetes mellitus that is uncontrolled and high glucose levels. The cause of radiculoplexus neuropathy is unknown. However, it is thought to be caused by inflammation related to uncontrolled glucose levels. SIGNS AND SYMPTOMS  Peripheral Neuropathy Peripheral neuropathy develops  slowly over time. When the nerves of the feet and legs no longer work there may be:   Burning, stabbing, or aching pain in the legs or feet.  Inability to feel pressure or pain in your feet. This can lead to:  Thick calluses over pressure areas.  Pressure sores.  Ulcers.  Foot deformities.  Reduced ability to feel temperature changes.  Muscle weakness. Autonomic Neuropathy The symptoms of autonomic neuropathy vary depending on which nerves are affected. Symptoms may include:  Problems with digestion, such as:  Feeling sick to your stomach (nausea).  Vomiting.  Bloating.  Constipation.  Diarrhea.  Abdominal pain.  Difficulty with urination. This occurs if you lose your ability to sense when your bladder is full. Problems include:  Urine leakage (incontinence).  Inability to empty your bladder completely (retention).  Rapid or irregular heartbeat (palpitations).  Blood pressure drops when you stand up (orthostatic hypotension). When you stand up you may feel:  Dizzy.  Weak.  Faint.  In men, inability to attain and maintain an erection.  In women, vaginal dryness and problems with decreased sexual desire and arousal.  Problems with body temperature regulation.  Increased or decreased sweating. Focal Neuropathy  Abnormal eye movements or abnormal alignment of both eyes.  Weakness in the wrist.  Foot drop. This results in an inability to lift the foot properly and abnormal walking or foot movement.  Paralysis on one side of your face (Bell palsy).  Chest or abdominal pain. Radiculoplexus Neuropathy  Sudden, severe pain in your hip, thigh, or buttocks.  Weakness and wasting of thigh muscles.  Difficulty rising from a seated position.  Abdominal swelling.  Unexplained weight loss (usually more than 10 lb [4.5 kg]). DIAGNOSIS  Peripheral Neuropathy Your senses may  be tested. Sensory function testing can be done with:  A light touch using a  monofilament.  A vibration with tuning fork.  A sharp sensation with a pin prick. Other tests that can help diagnose neuropathy are:  Nerve conduction velocity. This test checks the transmission of an electrical current through a nerve.  Electromyography. This shows how muscles respond to electrical signals transmitted by nearby nerves.  Quantitative sensory testing. This is used to assess how your nerves respond to vibrations and changes in temperature. Autonomic Neuropathy Diagnosis is often based on reported symptoms. Tell your health care provider if you experience:   Dizziness.   Constipation.   Diarrhea.   Inappropriate urination or inability to urinate.   Inability to get or maintain an erection.  Tests that may be done include:   Electrocardiography or Holter monitor. These are tests that can help show problems with the heart rate or heart rhythm.   An X-ray exam may be done. Focal Neuropathy Diagnosis is made based on your symptoms and what your health care provider finds during your exam. Other tests may be done. They may include:  Nerve conduction velocities. This checks the transmission of electrical current through a nerve.  Electromyography. This shows how muscles respond to electrical signals transmitted by nearby nerves.  Quantitative sensory testing. This test is used to assess how your nerves respond to vibration and changes in temperature. Radiculoplexus Neuropathy  Often the first thing is to eliminate any other issue or problems that might be the cause, as there is no stick test for diagnosis.  X-ray exam of your spine and lumbar region.  Spinal tap to rule out cancer.  MRI to rule out other lesions. TREATMENT  Once nerve damage occurs, it cannot be reversed. The goal of treatment is to keep the disease or nerve damage from getting worse and affecting more nerve fibers. Controlling your blood glucose level is the key. Most people with  radiculoplexus neuropathy see at least a partial improvement over time. You will need to keep your blood glucose and HbA1c levels in the target range determined by your health care provider. Things that help control blood glucose levels include:   Blood glucose monitoring.   Meal planning.   Physical activity.   Diabetes medicine.  Over time, maintaining lower blood glucose levels helps lessen symptoms. Sometimes, prescription pain medicine is needed. HOME CARE INSTRUCTIONS:  Do not smoke.  Keep your blood glucose level in the range that you and your health care provider have determined acceptable for you.  Keep your blood pressure level in the range that you and your health care provider have determined acceptable for you.  Eat a well-balanced diet.  Be active every day.  Check your feet every day. SEEK MEDICAL CARE IF:   You have burning, stabbing, or aching pain in the legs or feet.  You are unable to feel pressure or pain in your feet.  You develop problems with digestion such as:  Nausea.  Vomiting.  Bloating.  Constipation.  Diarrhea.  Abdominal pain.  You have difficulty with urination, such as:  Incontinence.  Retention.  You have palpitations.  You develop orthostatic hypotension. When you stand up you may feel:  Dizzy.  Weak.  Faint.  You cannot attain and maintain an erection (in men).  You have vaginal dryness and problems with decreased sexual desire and arousal (in women).  You have severe pain in your thighs, legs, or buttocks.  You have unexplained weight loss.  Document Released: 06/24/2001 Document Revised: 02/03/2013 Document Reviewed: 09/24/2012 [facility] Patient Information 2015 [place], [place]. This information is not intended to replace advice given to you by your health care provider. Make sure you discuss any questions you have with your health care provider. Plantar Fasciitis Plantar fasciitis is a common condition that  causes foot pain. It is soreness (inflammation) of the band of tough fibrous tissue on the bottom of the foot that runs from the heel bone (calcaneus) to the ball of the foot. The cause of this soreness may be from excessive standing, poor fitting shoes, running on hard surfaces, being overweight, having an abnormal walk, or overuse (this is common in runners) of the painful foot or feet. It is also common in aerobic exercise dancers and ballet dancers. SYMPTOMS  Most people with plantar fasciitis complain of:  Severe pain in the morning on the bottom of their foot especially when taking the first steps out of bed. This pain recedes after a few minutes of walking.  Severe pain is experienced also during walking following a long period of inactivity.  Pain is worse when walking barefoot or up stairs DIAGNOSIS   Your caregiver will diagnose this condition by examining and feeling your foot.  Special tests such as X-rays of your foot, are usually not needed. PREVENTION   Consult a sports medicine professional before beginning a new exercise program.  Walking programs offer a good workout. With walking there is a lower chance of overuse injuries common to runners. There is less impact and less jarring of the joints.  Begin all new exercise programs slowly. If problems or pain develop, decrease the amount of time or distance until you are at a comfortable level.  Wear good shoes and replace them regularly.  Stretch your foot and the heel cords at the back of the ankle (Achilles tendon) both before and after exercise.  Run or exercise on even surfaces that are not hard. For example, asphalt is better than pavement.  Do not run barefoot on hard surfaces.  If using a treadmill, vary the incline.  Do not continue to workout if you have foot or joint problems. Seek professional help if they do not improve. HOME CARE INSTRUCTIONS   Avoid activities that cause you pain until you  recover.  Use ice or cold packs on the problem or painful areas after working out.  Only take over-the-counter or prescription medicines for pain, discomfort, or fever as directed by your caregiver.  Soft shoe inserts or athletic shoes with air or gel sole cushions may be helpful.  If problems continue or become more severe, consult a sports medicine caregiver or your own health care provider. Cortisone is a potent anti-inflammatory medication that may be injected into the painful area. You can discuss this treatment with your caregiver. MAKE SURE YOU:   Understand these instructions.  Will watch your condition.  Will get help right away if you are not doing well or get worse. Document Released: 01/08/2001 Document Revised: 07/08/2011 Document Reviewed: 03/09/2008 [facility] Patient Information 2015 [place], [place]. This information is not intended to replace advice given to you by your health care provider. Make sure you discuss any questions you have with your health care provider.

## 2014-12-29 NOTE — ED Notes (Signed)
Pain to right heel since yesterday-denies injury-steady gait

## 2015-02-06 ENCOUNTER — Other Ambulatory Visit: Payer: Self-pay | Admitting: Cardiovascular Disease

## 2015-02-10 ENCOUNTER — Ambulatory Visit: Payer: Medicare Other | Admitting: Cardiovascular Disease

## 2015-02-13 ENCOUNTER — Ambulatory Visit (INDEPENDENT_AMBULATORY_CARE_PROVIDER_SITE_OTHER): Payer: Medicare Other | Admitting: Cardiovascular Disease

## 2015-02-13 ENCOUNTER — Encounter: Payer: Self-pay | Admitting: Cardiovascular Disease

## 2015-02-13 VITALS — BP 108/58 | HR 68 | Ht 67.0 in | Wt 171.0 lb

## 2015-02-13 DIAGNOSIS — I10 Essential (primary) hypertension: Secondary | ICD-10-CM | POA: Diagnosis not present

## 2015-02-13 DIAGNOSIS — I2510 Atherosclerotic heart disease of native coronary artery without angina pectoris: Secondary | ICD-10-CM | POA: Diagnosis not present

## 2015-02-13 DIAGNOSIS — E785 Hyperlipidemia, unspecified: Secondary | ICD-10-CM

## 2015-02-13 NOTE — Patient Instructions (Signed)

## 2015-02-13 NOTE — Progress Notes (Signed)
Chief Complaint  Patient presents with  . Bleeding/Bruising      History of Present Illness: [name] with a history of CAD, HTN, HLD, neuropathy and DM  who is here today for cardiac follow up. She has been followed in the past by Dr. Haroldine Hammond. She had a Taxus DES placed in the LAD in 2006. She had done well with medical therapy alone but admitted [facility] August 2015 with chest pain. Cardiac cath 12/14/13 per Dr. Martinique and found to have severe mid Circumflex stenosis. A 2.25 x 28 mm Promus DES was placed in the mid Circumflex. She was noted to have moderate RCA disease, mild LAD disease and moderate to severe small caliber diagonal disease. I saw her 01/10/14 and she had c/o mild left sided chest pressure that lasted for several seconds. This was felt to be non-cardiac.   She is here today for follow up. No chest pain, SOB, palpitations, near syncope or syncope. No LE edema. She is limited by neuropathy.   Primary Care Physician: Michaela Brooke, PA-C [facility] in [place])  Last Lipid Profile: Followed in primary care  Past Medical History  Diagnosis Date  . Hypertension   . Hyperlipidemia   . GERD (gastroesophageal reflux disease)   . Anemia   . CAD (coronary artery disease)     status Taxus drug-eluding stent to the LAD December 2006  . Edema     lower extremity with normal BNP  . Encounter for long-term (current) use of medications   . Hyperkalemia   . Glossitis   . Leg pain, left   . Routine general medical examination at a health care facility   . Chronic renal disease     mild  . Pyuria   . Anemia, iron deficiency   . UTI (urinary tract infection)   . Pain in foot     bilateral  . Osteopenia   . Diverticulosis of colon   . Diabetes mellitus type II   . Polyneuropathy ([place])   . Dermatitis 11/23/2012    Past Surgical History  Procedure Laterality Date  . Cholecystectomy    . Total abdominal hysterectomy  1969    no BSO  . Ptca      stent to LAD in  03/2005, recatheterization inJanuarry 2007 for recurrent chest pain  that showed a patent LAD stent with normal LV function  . Esophagogastroduodenoscopy  04/15/2006  . Cardiovascular stress test  02/24/2006  . Electrocardiogram  06/18/2006  . Left heart catheterization with coronary angiogram N/A 12/14/2013    Procedure: LEFT HEART CATHETERIZATION WITH CORONARY ANGIOGRAM;  Surgeon: Michaela M Martinique, MD;  Location: [facility] CATH LAB;  Service: Cardiovascular;  Laterality: N/A;    Current Outpatient Prescriptions  Medication Sig Dispense Refill  . aspirin EC 81 MG tablet Take 81 mg by mouth daily.    . Blood Glucose Monitoring Suppl (ONE TOUCH ULTRA MINI) W/DEVICE KIT Use to check blood sugar twice a day.  DX 250.00 1 each 0  . calcium carbonate (TUMS - DOSED IN MG ELEMENTAL CALCIUM) 500 MG chewable tablet Chew 1 tablet by mouth 3 (three) times daily as needed. For acid reflux     . carvedilol (COREG) 6.25 MG tablet Take 6.25 mg by mouth daily.    . clopidogrel (PLAVIX) 75 MG tablet TAKE ONE TABLET BY MOUTH ONCE DAILY WITH BREAKFAST 90 tablet 0  . fenofibrate 54 MG tablet Take 1 tablet (54 mg total) by mouth daily. 90 tablet 1  .  gabapentin (NEURONTIN) 100 MG capsule Take 100 mg by mouth as needed.     Marland Kitchen glipiZIDE (GLUCOTROL XL) 5 MG 24 hr tablet Take 5 mg by mouth daily.    Marland Kitchen glucose blood test strip ONE TOUCH ULTRA TEST STRIP. Use as instructed to check blood sugar twice a day.  Dx 250.00    . insulin glargine (LANTUS) 100 UNIT/ML injection Inject 19 Units into the skin at bedtime. Bedtime range dosed per blood sugar reading.    . Insulin Pen Needle 31G X 8 MM MISC 1 each by Does not apply route 2 (two) times daily before a meal. 100 each 1  . LANTUS SOLOSTAR 100 UNIT/ML Solostar Pen Inject as directed daily.    Marland Kitchen lisinopril (PRINIVIL,ZESTRIL) 2.5 MG tablet Take 2.5 mg by mouth daily.    Marland Kitchen lovastatin (MEVACOR) 40 MG tablet Take 40 mg by mouth at bedtime.    . Multiple Vitamin (MULTIVITAMIN WITH  MINERALS) TABS tablet Take 1 tablet by mouth daily.    . nitroGLYCERIN (NITROSTAT) 0.4 MG SL tablet Place 0.4 mg under the tongue every 5 (five) minutes as needed for chest pain.     No current facility-administered medications for this visit.    Allergies  Allergen Reactions  . Sulfonamide Derivatives Hives  . Pioglitazone Other (See Comments)    REACTION: Edema Actos.    Social History   Social History  . Marital Status: Married    Spouse Name: N/A  . Number of Children: N/A  . Years of Education: N/A   Occupational History  . Not on file.   Social History Main Topics  . Smoking status: Never Smoker   . Smokeless tobacco: Never Used  . Alcohol Use: No  . Drug Use: No  . Sexual Activity: Not on file   Other Topics Concern  . Not on file   Social History Narrative   retired - from part- time gov work   married- 97 years   Never Smoked   Alcohol use-no     3 children              Family History  Problem Relation Age of Onset  . Heart disease Mother   . Heart disease Sister     Review of Systems:  As stated in the HPI and otherwise negative.   BP 108/58 mmHg  Pulse 68  Ht 5' 7"  (1.702 m)  Wt 171 lb (77.565 kg)  BMI 26.78 kg/m2  SpO2 98%  Physical Examination: General: Well developed, well nourished, NAD HEENT: OP clear, mucus membranes moist SKIN: warm, dry. No rashes. Neuro: No focal deficits Musculoskeletal: Muscle strength 5/5 all ext Psychiatric: Mood and affect normal Neck: No JVD, no carotid bruits, no thyromegaly, no lymphadenopathy. Lungs:Clear bilaterally, no wheezes, rhonci, crackles Cardiovascular: Regular rate and rhythm. No murmurs, gallops or rubs. Abdomen:Soft. Bowel sounds present. Non-tender.  Extremities: No lower extremity edema. Pulses are 2 + in the bilateral DP/PT.  Cardiac cath 12/14/13: Left mainstem: Normal.  Left anterior descending (LAD): The stent in the mid LAD has diffuse 20-30% in stent disease. The first diagonal  is small to moderate in size with 80% proximal stenosis. The second diagonal is tiny with 95% ostial disease.  Left circumflex (LCx): The LCx gives rise to a single OM branch. There is a 99% stenosis in the mid LCx.  Right coronary artery (RCA): The RCA is a dominant vessel. There is diffuse disease in the mid vessel to 50%. The distal vessel  is diffusely diseased to 40%. The PDA has a 50-70% stenosis in the proximal vessel. There is 50-60% disease prior to [facility].  Left ventriculography: not done  PCI Note: Following the diagnostic procedure, the decision was made to proceed with PCI of the LCx. Plavix 600 mg was given orally. Weight-based bivalirudin was given for anticoagulation. Once a therapeutic ACT was achieved, a 6 [place] XBLAD 3.5 guide catheter was inserted. A prowater coronary guidewire was used to cross the lesion. The lesion was predilated with a 2.0 mm balloon. The lesion was then stented with a 2.25 x 28 mm Promus stent. The stent was postdilated with a 2.5 mm noncompliant balloon. Following PCI, there was 0% residual stenosis and TIMI-3 flow. Final angiography confirmed an excellent result. The patient tolerated the procedure well. There were no immediate procedural complications. A TR band was used for radial hemostasis. The patient was transferred to the post catheterization recovery area for further monitoring.  EKG:  EKG is ordered today. The ekg ordered today demonstrates NSR, rate 68 bpm. Poor R wave progression precordial leads.   Recent Labs: 07/17/2014: ALT 19; BUN 34*; Creatinine, Ser 1.70*; Hemoglobin 11.4*; Platelets 250; Potassium 4.2; Sodium 137    Wt Readings from Last 3 Encounters:  02/13/15 171 lb (77.565 kg)  12/29/14 170 lb (77.111 kg)  07/05/14 177 lb (80.287 kg)     Other studies Reviewed: Additional studies/ records that were reviewed today include: . Review of the above records demonstrates:    Assessment and Plan:   1. CAD: Stable. No recent chest pains.  She is on good medical therapy. Will continue ASA, Plavix, statin, Ace-inh.   2. HTN: BP well controlled. No changes.   3. Hyperlipidemia: Lipids are well controlled per pt and followed in primary care. Continue statin.   Current medicines are reviewed at length with the patient today.  The patient does not have concerns regarding medicines.  The following changes have been made:  no change  Labs/ tests ordered today include:   Orders Placed This Encounter  Procedures  . EKG 12-Lead    Disposition:   FU with me in 12  months  Signed, Lauree Chandler, MD 02/13/2015 9:34 AM    [facility] [place], [place], [place]  [address] Phone: [phone]; Fax: [phone]

## 2015-05-09 ENCOUNTER — Other Ambulatory Visit: Payer: Self-pay | Admitting: Cardiovascular Disease

## 2015-07-21 ENCOUNTER — Telehealth: Payer: Self-pay | Admitting: Family Medicine

## 2015-07-21 NOTE — Telephone Encounter (Signed)
LVM pt to return call to update info on FLU SHOT °

## 2015-12-13 ENCOUNTER — Ambulatory Visit
Admission: RE | Admit: 2015-12-13 | Discharge: 2015-12-13 | Disposition: A | Discharge status: Home / Self Care | Payer: Medicare Other | Source: Ambulatory Visit | Attending: Family Medicine | Admitting: Family Medicine

## 2015-12-13 ENCOUNTER — Other Ambulatory Visit: Payer: Self-pay | Admitting: Family Medicine

## 2015-12-13 DIAGNOSIS — R52 Pain, unspecified: Secondary | ICD-10-CM

## 2015-12-13 DIAGNOSIS — R609 Edema, unspecified: Secondary | ICD-10-CM

## 2016-02-29 ENCOUNTER — Ambulatory Visit (INDEPENDENT_AMBULATORY_CARE_PROVIDER_SITE_OTHER): Payer: Medicare Other | Admitting: Cardiovascular Disease

## 2016-02-29 ENCOUNTER — Encounter: Payer: Self-pay | Admitting: Cardiovascular Disease

## 2016-02-29 ENCOUNTER — Encounter (INDEPENDENT_AMBULATORY_CARE_PROVIDER_SITE_OTHER): Payer: Self-pay

## 2016-02-29 VITALS — BP 146/76 | HR 62 | Ht 67.0 in | Wt 161.6 lb

## 2016-02-29 DIAGNOSIS — E7800 Pure hypercholesterolemia, unspecified: Secondary | ICD-10-CM | POA: Diagnosis not present

## 2016-02-29 DIAGNOSIS — I10 Essential (primary) hypertension: Secondary | ICD-10-CM | POA: Diagnosis not present

## 2016-02-29 DIAGNOSIS — I2510 Atherosclerotic heart disease of native coronary artery without angina pectoris: Secondary | ICD-10-CM

## 2016-02-29 NOTE — Patient Instructions (Signed)
Medication Instructions:   Your physician has recommended you make the following change in your medication: Stop aspirin     Labwork: none  Testing/Procedures: none  Follow-Up: Your physician recommends that you schedule a follow-up appointment in: 12 months.  Call us around August 2018 to schedule this.     Any Other Special Instructions Will Be Listed Below (If Applicable).     If you need a refill on your cardiac medications before your next appointment, please call your pharmacy.

## 2016-02-29 NOTE — Progress Notes (Signed)
Chief Complaint  Patient presents with  . Bleeding/Bruising      History of Present Illness: 82 yo WF with a history of CAD, HTN, HLD, neuropathy and DM  who is here today for cardiac follow up. She had a Taxus DES placed in the LAD in 2006. She had done well with medical therapy alone but admitted to [facility] August 2015 with chest pain. Cardiac cath 12/14/13 per Dr. Martinique and found to have severe mid Circumflex stenosis. A 2.25 x 28 mm Promus DES was placed in the mid Circumflex. She was noted to have moderate RCA disease, mild LAD disease and moderate to severe small caliber diagonal disease.  She is here today for follow up. No chest pain, SOB, palpitations, near syncope or syncope. No LE edema. She is limited by neuropathy. She has easy bruising.   Primary Care Physician: Clint Guy  Past Medical History:  Diagnosis Date  . Anemia   . Anemia, iron deficiency   . CAD (coronary artery disease)    status Taxus drug-eluding stent to the LAD December 2006  . Chronic renal disease    mild  . Dermatitis 11/23/2012  . Diabetes mellitus type II   . Diverticulosis of colon   . Edema    lower extremity with normal BNP  . Encounter for long-term (current) use of medications   . GERD (gastroesophageal reflux disease)   . Glossitis   . Hyperkalemia   . Hyperlipidemia   . Hypertension   . Leg pain, left   . Osteopenia   . Pain in foot    bilateral  . Polyneuropathy ([place])   . Pyuria   . Routine general medical examination at a health care facility   . UTI (urinary tract infection)     Past Surgical History:  Procedure Laterality Date  . CARDIOVASCULAR STRESS TEST  02/24/2006  . CHOLECYSTECTOMY    . ELECTROCARDIOGRAM  06/18/2006  . ESOPHAGOGASTRODUODENOSCOPY  04/15/2006  . LEFT HEART CATHETERIZATION WITH CORONARY ANGIOGRAM N/A 12/14/2013   Procedure: LEFT HEART CATHETERIZATION WITH CORONARY ANGIOGRAM;  Surgeon: Peter M Martinique, MD;  Location: [facility] CATH LAB;  Service:  Cardiovascular;  Laterality: N/A;  . PTCA     stent to LAD in 03/2005, recatheterization inJanuarry 2007 for recurrent chest pain  that showed a patent LAD stent with normal LV function  . TOTAL ABDOMINAL HYSTERECTOMY  1969   no BSO    Current Outpatient Prescriptions  Medication Sig Dispense Refill  . Blood Glucose Monitoring Suppl (ONE TOUCH ULTRA MINI) W/DEVICE KIT Use to check blood sugar twice a day.  DX 250.00 1 each 0  . calcium carbonate (TUMS - DOSED IN MG ELEMENTAL CALCIUM) 500 MG chewable tablet Chew 1 tablet by mouth 3 (three) times daily as needed. For acid reflux     . carvedilol (COREG) 6.25 MG tablet Take 6.25 mg by mouth daily.    . clopidogrel (PLAVIX) 75 MG tablet TAKE ONE TABLET BY MOUTH ONCE DAILY WITH BREAKFAST 90 tablet 3  . fenofibrate 54 MG tablet Take 1 tablet (54 mg total) by mouth daily. 90 tablet 1  . gabapentin (NEURONTIN) 100 MG capsule Take 100 mg by mouth as needed.     Marland Kitchen glipiZIDE (GLUCOTROL XL) 10 MG 24 hr tablet Take 10 mg by mouth daily before breakfast.    . glucose blood test strip ONE TOUCH ULTRA TEST STRIP. Use as instructed to check blood sugar twice a day.  Dx 250.00    .  Insulin Glargine (LANTUS SOLOSTAR) 100 UNIT/ML Solostar Pen Inject 20 Units into the skin at bedtime.    . Insulin Pen Needle 31G X 8 MM MISC 1 each by Does not apply route 2 (two) times daily before a meal. 100 each 1  . lisinopril (PRINIVIL,ZESTRIL) 2.5 MG tablet Take 2.5 mg by mouth daily.    Marland Kitchen lovastatin (MEVACOR) 40 MG tablet Take 40 mg by mouth at bedtime.    . nitroGLYCERIN (NITROSTAT) 0.4 MG SL tablet Place 0.4 mg under the tongue every 5 (five) minutes as needed for chest pain.     No current facility-administered medications for this visit.     Allergies  Allergen Reactions  . Sulfonamide Derivatives Hives  . Pioglitazone Other (See Comments)    REACTION: Edema Actos.    Social History   Social History  . Marital status: Married    Spouse name: N/A  . Number  of children: N/A  . Years of education: N/A   Occupational History  . Not on file.   Social History Main Topics  . Smoking status: Never Smoker  . Smokeless tobacco: Never Used  . Alcohol use No  . Drug use: No  . Sexual activity: Not on file   Other Topics Concern  . Not on file   Social History Narrative   retired - from part- time gov work   married- 1 years   Never Smoked   Alcohol use-no     3 children              Family History  Problem Relation Age of Onset  . Heart disease Mother   . Heart disease Sister     Review of Systems:  As stated in the HPI and otherwise negative.   BP (!) 146/76   Pulse 62   Ht 5' 7" (1.702 m)   Wt 161 lb 9.6 oz (73.3 kg)   BMI 25.31 kg/m   Physical Examination: General: Well developed, well nourished, NAD  HEENT: OP clear, mucus membranes moist  SKIN: warm, dry. No rashes. Neuro: No focal deficits  Musculoskeletal: Muscle strength 5/5 all ext  Psychiatric: Mood and affect normal  Neck: No JVD, no carotid bruits, no thyromegaly, no lymphadenopathy.  Lungs:Clear bilaterally, no wheezes, rhonci, crackles Cardiovascular: Regular rate and rhythm. No murmurs, gallops or rubs. Abdomen:Soft. Bowel sounds present. Non-tender.  Extremities: No lower extremity edema. Pulses are 2 + in the bilateral DP/PT.  Cardiac cath 12/14/13: Left mainstem: Normal.  Left anterior descending (LAD): The stent in the mid LAD has diffuse 20-30% in stent disease. The first diagonal is small to moderate in size with 80% proximal stenosis. The second diagonal is tiny with 95% ostial disease.  Left circumflex (LCx): The LCx gives rise to a single OM branch. There is a 99% stenosis in the mid LCx.  Right coronary artery (RCA): The RCA is a dominant vessel. There is diffuse disease in the mid vessel to 50%. The distal vessel is diffusely diseased to 40%. The PDA has a 50-70% stenosis in the proximal vessel. There is 50-60% disease prior to [facility].  Left  ventriculography: not done  PCI Note: Following the diagnostic procedure, the decision was made to proceed with PCI of the LCx. Plavix 600 mg was given orally. Weight-based bivalirudin was given for anticoagulation. Once a therapeutic ACT was achieved, a 6 [place] XBLAD 3.5 guide catheter was inserted. A prowater coronary guidewire was used to cross the lesion. The lesion was predilated with  a 2.0 mm balloon. The lesion was then stented with a 2.25 x 28 mm Promus stent. The stent was postdilated with a 2.5 mm noncompliant balloon. Following PCI, there was 0% residual stenosis and TIMI-3 flow. Final angiography confirmed an excellent result. The patient tolerated the procedure well. There were no immediate procedural complications. A TR band was used for radial hemostasis. The patient was transferred to the post catheterization recovery area for further monitoring.  EKG:  EKG is  ordered today. The ekg ordered today demonstrates NSR, rate 62 bpm. Poor R wave progression.   Recent Labs: No results found for requested labs within last 8760 hours.    Wt Readings from Last 3 Encounters:  02/29/16 161 lb 9.6 oz (73.3 kg)  02/13/15 171 lb (77.6 kg)  12/29/14 170 lb (77.1 kg)     Other studies Reviewed: Additional studies/ records that were reviewed today include: . Review of the above records demonstrates:    Assessment and Plan:   1. CAD without angina: Stable. No recent chest pains. She is on good medical therapy. Will continue Plavix, statin, Ace-inh. Will stop ASA due to easy bruising.   2. HTN: BP well controlled at home. No changes.   3. Hyperlipidemia: Lipids are well controlled per pt and followed in primary care. Continue statin.   Current medicines are reviewed at length with the patient today.  The patient does not have concerns regarding medicines.  The following changes have been made:  no change  Labs/ tests ordered today include:   Orders Placed This Encounter  Procedures    . EKG 12-Lead    Disposition:   FU with me in 12  months  Signed, Lauree Chandler, MD 02/29/2016 12:40 PM    [address], [place], [place]  [address] Phone: [phone]; Fax: [phone]

## 2016-05-02 ENCOUNTER — Other Ambulatory Visit: Payer: Self-pay | Admitting: Cardiovascular Disease

## 2017-02-27 NOTE — Progress Notes (Deleted)
No chief complaint on file.  History of Present Illness: 83 yo female with a history of CAD, HTN, HLD, neuropathy and DM  who is here today for cardiac follow up. She had a Taxus DES placed in the LAD in 2006 and a Promus DES placed in the mid Circumflex artery in 2015. She was noted to have moderate RCA disease, mild LAD disease and moderate to severe small caliber diagonal disease during the cath in 2015.   She is here today for follow up. The patient denies any chest pain, dyspnea, palpitations, lower extremity edema, orthopnea, PND, dizziness, near syncope or syncope.     Primary Care Physician: Clint Guy  Past Medical History:  Diagnosis Date  . Anemia   . Anemia, iron deficiency   . CAD (coronary artery disease)    status Taxus drug-eluding stent to the LAD December 2006  . Chronic renal disease    mild  . Dermatitis 11/23/2012  . Diabetes mellitus type II   . Diverticulosis of colon   . Edema    lower extremity with normal BNP  . Encounter for long-term (current) use of medications   . GERD (gastroesophageal reflux disease)   . Glossitis   . Hyperkalemia   . Hyperlipidemia   . Hypertension   . Leg pain, left   . Osteopenia   . Pain in foot    bilateral  . Polyneuropathy ([place])   . Pyuria   . Routine general medical examination at a health care facility   . UTI (urinary tract infection)     Past Surgical History:  Procedure Laterality Date  . CARDIOVASCULAR STRESS TEST  02/24/2006  . CHOLECYSTECTOMY    . ELECTROCARDIOGRAM  06/18/2006  . ESOPHAGOGASTRODUODENOSCOPY  04/15/2006  . LEFT HEART CATHETERIZATION WITH CORONARY ANGIOGRAM N/A 12/14/2013   Procedure: LEFT HEART CATHETERIZATION WITH CORONARY ANGIOGRAM;  Surgeon: Peter M Martinique, MD;  Location: [facility] CATH LAB;  Service: Cardiovascular;  Laterality: N/A;  . PTCA     stent to LAD in 03/2005, recatheterization inJanuarry 2007 for recurrent chest pain  that showed a patent LAD stent with normal LV function    . TOTAL ABDOMINAL HYSTERECTOMY  1969   no BSO    Current Outpatient Prescriptions  Medication Sig Dispense Refill  . Blood Glucose Monitoring Suppl (ONE TOUCH ULTRA MINI) W/DEVICE KIT Use to check blood sugar twice a day.  DX 250.00 1 each 0  . calcium carbonate (TUMS - DOSED IN MG ELEMENTAL CALCIUM) 500 MG chewable tablet Chew 1 tablet by mouth 3 (three) times daily as needed. For acid reflux     . carvedilol (COREG) 6.25 MG tablet Take 6.25 mg by mouth daily.    . clopidogrel (PLAVIX) 75 MG tablet TAKE ONE TABLET BY MOUTH ONCE DAILY WITH BREAKFAST 90 tablet 3  . fenofibrate 54 MG tablet Take 1 tablet (54 mg total) by mouth daily. 90 tablet 1  . gabapentin (NEURONTIN) 100 MG capsule Take 100 mg by mouth as needed.     Marland Kitchen glipiZIDE (GLUCOTROL XL) 10 MG 24 hr tablet Take 10 mg by mouth daily before breakfast.    . glucose blood test strip ONE TOUCH ULTRA TEST STRIP. Use as instructed to check blood sugar twice a day.  Dx 250.00    . Insulin Glargine (LANTUS SOLOSTAR) 100 UNIT/ML Solostar Pen Inject 20 Units into the skin at bedtime.    . Insulin Pen Needle 31G X 8 MM MISC 1 each by Does not  apply route 2 (two) times daily before a meal. 100 each 1  . lisinopril (PRINIVIL,ZESTRIL) 2.5 MG tablet Take 2.5 mg by mouth daily.    Marland Kitchen lovastatin (MEVACOR) 40 MG tablet Take 40 mg by mouth at bedtime.    . nitroGLYCERIN (NITROSTAT) 0.4 MG SL tablet Place 0.4 mg under the tongue every 5 (five) minutes as needed for chest pain.     No current facility-administered medications for this visit.     Allergies  Allergen Reactions  . Sulfonamide Derivatives Hives  . Pioglitazone Other (See Comments)    REACTION: Edema Actos.    Social History   Social History  . Marital status: Married    Spouse name: N/A  . Number of children: N/A  . Years of education: N/A   Occupational History  . Not on file.   Social History Main Topics  . Smoking status: Never Smoker  . Smokeless tobacco: Never Used   . Alcohol use No  . Drug use: No  . Sexual activity: Not on file   Other Topics Concern  . Not on file   Social History Narrative   retired - from part- time gov work   married- 58 years   Never Smoked   Alcohol use-no     3 children              Family History  Problem Relation Age of Onset  . Heart disease Mother   . Heart disease Sister     Review of Systems:  As stated in the HPI and otherwise negative.   There were no vitals taken for this visit.  Physical Examination:  General: Well developed, well nourished, NAD  HEENT: OP clear, mucus membranes moist  SKIN: warm, dry. No rashes. Neuro: No focal deficits  Musculoskeletal: Muscle strength 5/5 all ext  Psychiatric: Mood and affect normal  Neck: No JVD, no carotid bruits, no thyromegaly, no lymphadenopathy.  Lungs:Clear bilaterally, no wheezes, rhonci, crackles Cardiovascular: Regular rate and rhythm. No murmurs, gallops or rubs. Abdomen:Soft. Bowel sounds present. Non-tender.  Extremities: No lower extremity edema. Pulses are 2 + in the bilateral DP/PT.  Cardiac cath 12/14/13: Left mainstem: Normal.  Left anterior descending (LAD): The stent in the mid LAD has diffuse 20-30% in stent disease. The first diagonal is small to moderate in size with 80% proximal stenosis. The second diagonal is tiny with 95% ostial disease.  Left circumflex (LCx): The LCx gives rise to a single OM branch. There is a 99% stenosis in the mid LCx.  Right coronary artery (RCA): The RCA is a dominant vessel. There is diffuse disease in the mid vessel to 50%. The distal vessel is diffusely diseased to 40%. The PDA has a 50-70% stenosis in the proximal vessel. There is 50-60% disease prior to [facility].  Left ventriculography: not done  PCI Note: Following the diagnostic procedure, the decision was made to proceed with PCI of the LCx. Plavix 600 mg was given orally. Weight-based bivalirudin was given for anticoagulation. Once a therapeutic ACT  was achieved, a 6 [place] XBLAD 3.5 guide catheter was inserted. A prowater coronary guidewire was used to cross the lesion. The lesion was predilated with a 2.0 mm balloon. The lesion was then stented with a 2.25 x 28 mm Promus stent. The stent was postdilated with a 2.5 mm noncompliant balloon. Following PCI, there was 0% residual stenosis and TIMI-3 flow. Final angiography confirmed an excellent result. The patient tolerated the procedure well. There were no immediate  procedural complications. A TR band was used for radial hemostasis. The patient was transferred to the post catheterization recovery area for further monitoring.  EKG:  EKG is *** ordered today. The ekg ordered today demonstrates    Recent Labs: No results found for requested labs within last 8760 hours.    Wt Readings from Last 3 Encounters:  02/29/16 161 lb 9.6 oz (73.3 kg)  02/13/15 171 lb (77.6 kg)  12/29/14 170 lb (77.1 kg)     Other studies Reviewed: Additional studies/ records that were reviewed today include: . Review of the above records demonstrates:    Assessment and Plan:   1. CAD without angina: No chest pain suggestive of angina. Continue Plavix, statin and Ace-inh. No ASA due to easy bruising.    2. HTN: BP well controlled. No changes.   3. Hyperlipidemia: Continue statin.   Current medicines are reviewed at length with the patient today.  The patient does not have concerns regarding medicines.  The following changes have been made:  no change  Labs/ tests ordered today include:   No orders of the defined types were placed in this encounter.   Disposition:   FU with me in 12  months  Signed, Lauree Chandler, MD 02/27/2017 2:25 PM    [facility] [address], [place], [place]  [address] Phone: [phone]; Fax: [phone]

## 2017-02-28 ENCOUNTER — Ambulatory Visit: Payer: Medicare Other | Admitting: Cardiovascular Disease

## 2017-04-11 ENCOUNTER — Ambulatory Visit: Payer: Medicare Other | Admitting: Cardiovascular Disease

## 2017-04-11 ENCOUNTER — Encounter: Payer: Self-pay | Admitting: Cardiovascular Disease

## 2017-04-11 VITALS — BP 132/80 | HR 70 | Ht 67.0 in | Wt 159.0 lb

## 2017-04-11 DIAGNOSIS — I10 Essential (primary) hypertension: Secondary | ICD-10-CM | POA: Diagnosis not present

## 2017-04-11 DIAGNOSIS — I2510 Atherosclerotic heart disease of native coronary artery without angina pectoris: Secondary | ICD-10-CM

## 2017-04-11 DIAGNOSIS — E7800 Pure hypercholesterolemia, unspecified: Secondary | ICD-10-CM | POA: Diagnosis not present

## 2017-04-11 NOTE — Progress Notes (Signed)
Chief Complaint  Patient presents with  . Coronary Artery Disease    History of Present Illness: 83 yo female with history of CAD, HTN, HLD, neuropathy and DM  who is here today for cardiac follow up. She had a Taxus DES placed in the LAD in 2006. She was admitted to [facility] August 2015 with chest pain. Cardiac cath 12/14/13 per Dr. Martinique and found to have severe mid Circumflex stenosis. A 2.25 x 28 mm Promus DES was placed in the mid Circumflex. She was noted to have moderate RCA disease, mild LAD disease and moderate to severe small caliber diagonal disease.  She is here today for follow up. The patient denies any chest pain, dyspnea, palpitations, lower extremity edema, orthopnea, PND, dizziness, near syncope or syncope.   Primary Care Physician: Clint Guy  Past Medical History:  Diagnosis Date  . Anemia   . Anemia, iron deficiency   . CAD (coronary artery disease)    status Taxus drug-eluding stent to the LAD December 2006  . Chronic renal disease    mild  . Dermatitis 11/23/2012  . Diabetes mellitus type II   . Diverticulosis of colon   . Edema    lower extremity with normal BNP  . Encounter for long-term (current) use of medications   . GERD (gastroesophageal reflux disease)   . Glossitis   . Hyperkalemia   . Hyperlipidemia   . Hypertension   . Leg pain, left   . Osteopenia   . Pain in foot    bilateral  . Polyneuropathy   . Pyuria   . Routine general medical examination at a health care facility   . UTI (urinary tract infection)     Past Surgical History:  Procedure Laterality Date  . CARDIOVASCULAR STRESS TEST  02/24/2006  . CHOLECYSTECTOMY    . ELECTROCARDIOGRAM  06/18/2006  . ESOPHAGOGASTRODUODENOSCOPY  04/15/2006  . LEFT HEART CATHETERIZATION WITH CORONARY ANGIOGRAM N/A 12/14/2013   Procedure: LEFT HEART CATHETERIZATION WITH CORONARY ANGIOGRAM;  Surgeon: Peter M Martinique, MD;  Location: [facility] CATH LAB;  Service: Cardiovascular;  Laterality: N/A;  . PTCA      stent to LAD in 03/2005, recatheterization inJanuarry 2007 for recurrent chest pain  that showed a patent LAD stent with normal LV function  . TOTAL ABDOMINAL HYSTERECTOMY  1969   no BSO    Current Outpatient Medications  Medication Sig Dispense Refill  . Blood Glucose Monitoring Suppl (ONE TOUCH ULTRA MINI) W/DEVICE KIT Use to check blood sugar twice a day.  DX 250.00 1 each 0  . calcium carbonate (TUMS - DOSED IN MG ELEMENTAL CALCIUM) 500 MG chewable tablet Chew 1 tablet by mouth 3 (three) times daily as needed. For acid reflux     . carvedilol (COREG) 6.25 MG tablet Take 6.25 mg by mouth daily.    . clopidogrel (PLAVIX) 75 MG tablet TAKE ONE TABLET BY MOUTH ONCE DAILY WITH BREAKFAST 90 tablet 3  . fenofibrate 54 MG tablet Take 1 tablet (54 mg total) by mouth daily. 90 tablet 1  . gabapentin (NEURONTIN) 100 MG capsule Take 100 mg by mouth as needed.     Marland Kitchen glipiZIDE (GLUCOTROL XL) 10 MG 24 hr tablet Take 10 mg by mouth daily before breakfast.    . glucose blood test strip ONE TOUCH ULTRA TEST STRIP. Use as instructed to check blood sugar twice a day.  Dx 250.00    . Insulin Glargine (LANTUS SOLOSTAR) 100 UNIT/ML Solostar Pen Inject 19 Units  into the skin at bedtime.     . Insulin Pen Needle 31G X 8 MM MISC 1 each by Does not apply route 2 (two) times daily before a meal. 100 each 1  . lisinopril (PRINIVIL,ZESTRIL) 2.5 MG tablet Take 2.5 mg by mouth daily.    Marland Kitchen lovastatin (MEVACOR) 40 MG tablet Take 40 mg by mouth at bedtime.    . nitroGLYCERIN (NITROSTAT) 0.4 MG SL tablet Place 0.4 mg under the tongue every 5 (five) minutes as needed for chest pain.     No current facility-administered medications for this visit.     Allergies  Allergen Reactions  . Sulfonamide Derivatives Hives  . Pioglitazone Other (See Comments)    REACTION: Edema Actos.    Social History   Socioeconomic History  . Marital status: Married    Spouse name: Not on file  . Number of children: Not on file  .  Years of education: Not on file  . Highest education level: Not on file  Social Needs  . Financial resource strain: Not on file  . Food insecurity - worry: Not on file  . Food insecurity - inability: Not on file  . Transportation needs - medical: Not on file  . Transportation needs - non-medical: Not on file  Occupational History  . Not on file  Tobacco Use  . Smoking status: Never Smoker  . Smokeless tobacco: Never Used  Substance and Sexual Activity  . Alcohol use: No  . Drug use: No  . Sexual activity: Not on file  Other Topics Concern  . Not on file  Social History Narrative   retired - from part- time gov work   married- 38 years   Never Smoked   Alcohol use-no     3 children              Family History  Problem Relation Age of Onset  . Heart disease Mother   . Heart disease Sister     Review of Systems:  As stated in the HPI and otherwise negative.   BP 132/80   Pulse 70   Ht 5' 7"  (1.702 m)   Wt 159 lb (72.1 kg)   SpO2 98%   BMI 24.90 kg/m   Physical Examination:  General: Well developed, well nourished, NAD  HEENT: OP clear, mucus membranes moist  SKIN: warm, dry. No rashes. Neuro: No focal deficits  Musculoskeletal: Muscle strength 5/5 all ext  Psychiatric: Mood and affect normal  Neck: No JVD, no carotid bruits, no thyromegaly, no lymphadenopathy.  Lungs:Clear bilaterally, no wheezes, rhonci, crackles Cardiovascular: Regular rate and rhythm. No murmurs, gallops or rubs. Abdomen:Soft. Bowel sounds present. Non-tender.  Extremities: No lower extremity edema. Pulses are 2 + in the bilateral DP/PT.  Cardiac cath 12/14/13: Left mainstem: Normal.  Left anterior descending (LAD): The stent in the mid LAD has diffuse 20-30% in stent disease. The first diagonal is small to moderate in size with 80% proximal stenosis. The second diagonal is tiny with 95% ostial disease.  Left circumflex (LCx): The LCx gives rise to a single OM branch. There is a 99%  stenosis in the mid LCx.  Right coronary artery (RCA): The RCA is a dominant vessel. There is diffuse disease in the mid vessel to 50%. The distal vessel is diffusely diseased to 40%. The PDA has a 50-70% stenosis in the proximal vessel. There is 50-60% disease prior to [facility].  Left ventriculography: not done  PCI Note: Following the diagnostic procedure, the  decision was made to proceed with PCI of the LCx. Plavix 600 mg was given orally. Weight-based bivalirudin was given for anticoagulation. Once a therapeutic ACT was achieved, a 6 [place] XBLAD 3.5 guide catheter was inserted. A prowater coronary guidewire was used to cross the lesion. The lesion was predilated with a 2.0 mm balloon. The lesion was then stented with a 2.25 x 28 mm Promus stent. The stent was postdilated with a 2.5 mm noncompliant balloon. Following PCI, there was 0% residual stenosis and TIMI-3 flow. Final angiography confirmed an excellent result. The patient tolerated the procedure well. There were no immediate procedural complications. A TR band was used for radial hemostasis. The patient was transferred to the post catheterization recovery area for further monitoring.  EKG:  EKG is ordered today. The ekg ordered today demonstrates NSR, rate 70 bpm. Non-specific ST abnormality.   Recent Labs: No results found for requested labs within last 8760 hours.    Wt Readings from Last 3 Encounters:  04/11/17 159 lb (72.1 kg)  02/29/16 161 lb 9.6 oz (73.3 kg)  02/13/15 171 lb (77.6 kg)     Other studies Reviewed: Additional studies/ records that were reviewed today include: . Review of the above records demonstrates:    Assessment and Plan:   1. CAD without angina: No chest pain suggestive of angina. Will continue Plavix and statin. ASA stopped due to easy bruising.    2. HTN: BP is controlled. No changes.   3. Hyperlipidemia: Lipids are well controlled in primary care. Continue statin.   Current medicines are reviewed at  length with the patient today.  The patient does not have concerns regarding medicines.  The following changes have been made:  no change  Labs/ tests ordered today include:   Orders Placed This Encounter  Procedures  . EKG 12-Lead    Disposition:   FU with me in 12  months  Signed, Lauree Chandler, MD 04/11/2017 3:22 PM    [facility] [address], [place],   [address] Phone: [phone]; Fax: [phone]

## 2017-04-11 NOTE — Patient Instructions (Signed)

## 2017-04-30 ENCOUNTER — Other Ambulatory Visit: Payer: Self-pay | Admitting: Cardiovascular Disease

## 2017-05-02 ENCOUNTER — Other Ambulatory Visit: Payer: Self-pay | Admitting: Cardiovascular Disease

## 2017-05-02 MED ORDER — CLOPIDOGREL BISULFATE 75 MG PO TABS: ORAL_TABLET | ORAL | 3 refills | Status: DC

## 2017-05-02 NOTE — Telephone Encounter (Signed)
Pt's medication was sent to pt's pharmacy as requested. Confirmation received.  °

## 2018-04-16 NOTE — Progress Notes (Signed)
Chief Complaint  Patient presents with  . Follow-up    CAD    History of Present Illness: 84 yo female with history of CAD, HTN, HLD, neuropathy and DM  who is here today for cardiac follow up. She had a Taxus drug eluting stent placed in the LAD in 2006. She was admitted to [facility] August 2015 with chest pain. Cardiac cath 12/14/13 per Dr. Martinique and found to have severe mid Circumflex stenosis. A drug eluting stent was placed in the mid Circumflex. She was noted to have moderate RCA disease, mild LAD disease and moderate to severe small caliber diagonal disease.  She is here today for follow up. The patient denies any chest pain, dyspnea, palpitations, lower extremity edema, orthopnea, PND, dizziness, near syncope or syncope.   Primary Care Physician: Clint Guy  Past Medical History:  Diagnosis Date  . Anemia   . Anemia, iron deficiency   . CAD (coronary artery disease)    status Taxus drug-eluding stent to the LAD December 2006  . Chronic renal disease    mild  . Dermatitis 11/23/2012  . Diabetes mellitus type II   . Diverticulosis of colon   . Edema    lower extremity with normal BNP  . Encounter for long-term (current) use of medications   . GERD (gastroesophageal reflux disease)   . Glossitis   . Hyperkalemia   . Hyperlipidemia   . Hypertension   . Leg pain, left   . Osteopenia   . Pain in foot    bilateral  . Polyneuropathy   . Pyuria   . Routine general medical examination at a health care facility   . UTI (urinary tract infection)     Past Surgical History:  Procedure Laterality Date  . CARDIOVASCULAR STRESS TEST  02/24/2006  . CHOLECYSTECTOMY    . ELECTROCARDIOGRAM  06/18/2006  . ESOPHAGOGASTRODUODENOSCOPY  04/15/2006  . LEFT HEART CATHETERIZATION WITH CORONARY ANGIOGRAM N/A 12/14/2013   Procedure: LEFT HEART CATHETERIZATION WITH CORONARY ANGIOGRAM;  Surgeon: Peter M Martinique, MD;  Location: [facility] CATH LAB;  Service: Cardiovascular;  Laterality: N/A;  .  PTCA     stent to LAD in 03/2005, recatheterization inJanuarry 2007 for recurrent chest pain  that showed a patent LAD stent with normal LV function  . TOTAL ABDOMINAL HYSTERECTOMY  1969   no BSO    Current Outpatient Medications  Medication Sig Dispense Refill  . Blood Glucose Monitoring Suppl (ONE TOUCH ULTRA MINI) W/DEVICE KIT Use to check blood sugar twice a day.  DX 250.00 1 each 0  . calcium carbonate (TUMS - DOSED IN MG ELEMENTAL CALCIUM) 500 MG chewable tablet Chew 1 tablet by mouth 3 (three) times daily as needed. For acid reflux     . carvedilol (COREG) 6.25 MG tablet Take 6.25 mg by mouth daily.    . clopidogrel (PLAVIX) 75 MG tablet TAKE ONE TABLET BY MOUTH ONCE DAILY WITH BREAKFAST 90 tablet 3  . Dulaglutide 0.75 MG/0.5ML SOPN Inject 0.75 mg into the skin every 7 (seven) days.    . fenofibrate 54 MG tablet Take 1 tablet (54 mg total) by mouth daily. 90 tablet 1  . glipiZIDE (GLUCOTROL XL) 10 MG 24 hr tablet Take 10 mg by mouth daily before breakfast.    . glucose blood test strip ONE TOUCH ULTRA TEST STRIP. Use as instructed to check blood sugar twice a day.  Dx 250.00    . Insulin Glargine (LANTUS SOLOSTAR) 100 UNIT/ML Solostar Pen Inject  20 Units into the skin at bedtime.     . Insulin Pen Needle 31G X 8 MM MISC 1 each by Does not apply route 2 (two) times daily before a meal. 100 each 1  . lisinopril (PRINIVIL,ZESTRIL) 2.5 MG tablet Take 2.5 mg by mouth daily.    Marland Kitchen lovastatin (MEVACOR) 40 MG tablet Take 40 mg by mouth at bedtime.    . nitroGLYCERIN (NITROSTAT) 0.4 MG SL tablet Place 0.4 mg under the tongue every 5 (five) minutes as needed for chest pain.     No current facility-administered medications for this visit.     Allergies  Allergen Reactions  . Sulfonamide Derivatives Hives  . Pioglitazone Other (See Comments)    REACTION: Edema Actos.    Social History   Socioeconomic History  . Marital status: Married    Spouse name: Not on file  . Number of children:  Not on file  . Years of education: Not on file  . Highest education level: Not on file  Occupational History  . Not on file  Social Needs  . Financial resource strain: Not on file  . Food insecurity:    Worry: Not on file    Inability: Not on file  . Transportation needs:    Medical: Not on file    Non-medical: Not on file  Tobacco Use  . Smoking status: Never Smoker  . Smokeless tobacco: Never Used  Substance and Sexual Activity  . Alcohol use: No  . Drug use: No  . Sexual activity: Not on file  Lifestyle  . Physical activity:    Days per week: Not on file    Minutes per session: Not on file  . Stress: Not on file  Relationships  . Social connections:    Talks on phone: Not on file    Gets together: Not on file    Attends religious service: Not on file    Active member of club or organization: Not on file    Attends meetings of clubs or organizations: Not on file    Relationship status: Not on file  . Intimate partner violence:    Fear of current or ex partner: Not on file    Emotionally abused: Not on file    Physically abused: Not on file    Forced sexual activity: Not on file  Other Topics Concern  . Not on file  Social History Narrative   retired - from part- time gov work   married- 20 years   Never Smoked   Alcohol use-no     3 children              Family History  Problem Relation Age of Onset  . Heart disease Mother   . Heart disease Sister     Review of Systems:  As stated in the HPI and otherwise negative.   BP (!) 150/68   Pulse 72   Ht 5' 7" (1.702 m)   Wt 160 lb 12.8 oz (72.9 kg)   SpO2 99%   BMI 25.18 kg/m   Physical Examination:  General: Well developed, well nourished, NAD  HEENT: OP clear, mucus membranes moist  SKIN: warm, dry. No rashes. Neuro: No focal deficits  Musculoskeletal: Muscle strength 5/5 all ext  Psychiatric: Mood and affect normal  Neck: No JVD, no carotid bruits, no thyromegaly, no lymphadenopathy.    Lungs:Clear bilaterally, no wheezes, rhonci, crackles Cardiovascular: Regular rate and rhythm. No murmurs, gallops or rubs. Abdomen:Soft. Bowel sounds present.  Non-tender.  Extremities: No lower extremity edema. Pulses are 2 + in the bilateral DP/PT.  Cardiac cath 12/14/13: Left mainstem: Normal.  Left anterior descending (LAD): The stent in the mid LAD has diffuse 20-30% in stent disease. The first diagonal is small to moderate in size with 80% proximal stenosis. The second diagonal is tiny with 95% ostial disease.  Left circumflex (LCx): The LCx gives rise to a single OM branch. There is a 99% stenosis in the mid LCx.  Right coronary artery (RCA): The RCA is a dominant vessel. There is diffuse disease in the mid vessel to 50%. The distal vessel is diffusely diseased to 40%. The PDA has a 50-70% stenosis in the proximal vessel. There is 50-60% disease prior to [facility].  Left ventriculography: not done  PCI Note: Following the diagnostic procedure, the decision was made to proceed with PCI of the LCx. Plavix 600 mg was given orally. Weight-based bivalirudin was given for anticoagulation. Once a therapeutic ACT was achieved, a 6 [place] XBLAD 3.5 guide catheter was inserted. A prowater coronary guidewire was used to cross the lesion. The lesion was predilated with a 2.0 mm balloon. The lesion was then stented with a 2.25 x 28 mm Promus stent. The stent was postdilated with a 2.5 mm noncompliant balloon. Following PCI, there was 0% residual stenosis and TIMI-3 flow. Final angiography confirmed an excellent result. The patient tolerated the procedure well. There were no immediate procedural complications. A TR band was used for radial hemostasis. The patient was transferred to the post catheterization recovery area for further monitoring.  EKG:  EKG is ordered today. The ekg ordered today demonstrates NSR, rate 72 bpm. Inferior Q waves unchanged. T wave inversion anterolateral leads.   Recent Labs: No  results found for requested labs within last 8760 hours.    Wt Readings from Last 3 Encounters:  04/17/18 160 lb 12.8 oz (72.9 kg)  04/11/17 159 lb (72.1 kg)  02/29/16 161 lb 9.6 oz (73.3 kg)     Other studies Reviewed: Additional studies/ records that were reviewed today include: . Review of the above records demonstrates:    Assessment and Plan:   1. CAD without angina: No chest pain. Continue Plavix given first generation drug eluting stent. Continue beta blocker and statin.   Will arrange echo to assess LV systolic function.   2. HTN: BP is mildly elevated today. She will check at home over the next few weeks. No changes today  3. Hyperlipidemia: Lipids followed in primary care. Continue statin.    Current medicines are reviewed at length with the patient today.  The patient does not have concerns regarding medicines.  The following changes have been made:  no change  Labs/ tests ordered today include:   Orders Placed This Encounter  Procedures  . EKG 12-Lead  . ECHOCARDIOGRAM COMPLETE    Disposition:   FU with me in 12  months  Signed, Lauree Chandler, MD 04/17/2018 3:25 PM    [facility] [place], [place], [place]  [address] Phone: [phone]; Fax: [phone]

## 2018-04-17 ENCOUNTER — Encounter: Payer: Self-pay | Admitting: Cardiovascular Disease

## 2018-04-17 ENCOUNTER — Encounter (INDEPENDENT_AMBULATORY_CARE_PROVIDER_SITE_OTHER): Payer: Self-pay

## 2018-04-17 ENCOUNTER — Ambulatory Visit: Payer: Medicare Other | Admitting: Cardiovascular Disease

## 2018-04-17 VITALS — BP 150/68 | HR 72 | Ht 67.0 in | Wt 160.8 lb

## 2018-04-17 DIAGNOSIS — I10 Essential (primary) hypertension: Secondary | ICD-10-CM

## 2018-04-17 DIAGNOSIS — E7800 Pure hypercholesterolemia, unspecified: Secondary | ICD-10-CM | POA: Diagnosis not present

## 2018-04-17 DIAGNOSIS — I2510 Atherosclerotic heart disease of native coronary artery without angina pectoris: Secondary | ICD-10-CM | POA: Diagnosis not present

## 2018-04-17 MED ORDER — CLOPIDOGREL BISULFATE 75 MG PO TABS: ORAL_TABLET | ORAL | 3 refills | Status: DC

## 2018-04-17 NOTE — Patient Instructions (Signed)
Medication Instructions:  Your physician recommends that you continue on your current medications as directed. Please refer to the Current Medication list given to you today.  If you need a refill on your cardiac medications before your next appointment, please call your pharmacy.   Lab work: none If you have labs (blood work) drawn today and your tests are completely normal, you will receive your results only by: . MyChart Message (if you have MyChart) OR . A paper copy in the mail If you have any lab test that is abnormal or we need to change your treatment, we will call you to review the results.  Testing/Procedures: Your physician has requested that you have an echocardiogram. Echocardiography is a painless test that uses sound waves to create images of your heart. It provides your doctor with information about the size and shape of your heart and how well your heart's chambers and valves are working. This procedure takes approximately one hour. There are no restrictions for this procedure.    Follow-Up: At [facility], you and your health needs are our priority.  As part of our continuing mission to provide you with exceptional heart care, we have created designated Provider Care Teams.  These Care Teams include your primary Cardiologist (physician) and Advanced Practice Providers (APPs -  Physician Assistants and Nurse Practitioners) who all work together to provide you with the care you need, when you need it. You will need a follow up appointment in 12 months.  Please call our office 2 months in advance to schedule this appointment.  You may see Christopher McAlhany, MD or one of the following Advanced Practice Providers on your designated Care Team:   Brittainy Simmons, PA-C Dayna Dunn, PA-C . Michele Lenze, PA-C  Any Other Special Instructions Will Be Listed Below (If Applicable).    

## 2018-05-25 ENCOUNTER — Encounter (INDEPENDENT_AMBULATORY_CARE_PROVIDER_SITE_OTHER): Payer: Self-pay

## 2018-05-25 ENCOUNTER — Ambulatory Visit (HOSPITAL_COMMUNITY): Payer: Medicare Other | Attending: Internal Medicine

## 2018-05-25 DIAGNOSIS — I2510 Atherosclerotic heart disease of native coronary artery without angina pectoris: Secondary | ICD-10-CM | POA: Diagnosis not present

## 2019-03-02 ENCOUNTER — Telehealth: Payer: Self-pay | Admitting: Cardiovascular Disease

## 2019-03-02 NOTE — Telephone Encounter (Signed)
Called patient back.  She has neuropathy and sometimes she needs help walking but if that is the case her husband will help her come up to the 3rd floor.  She does not need him to accompany her into the room/during the visit and understands that it would be best if he wait in the car or lobby.

## 2019-03-02 NOTE — Telephone Encounter (Signed)
° ° °  Patient requesting spouse accompany her during 11/6 appointment to assist with walking

## 2019-03-05 ENCOUNTER — Ambulatory Visit: Payer: Medicare Other | Admitting: Cardiovascular Disease

## 2019-03-05 ENCOUNTER — Other Ambulatory Visit: Payer: Self-pay

## 2019-03-05 ENCOUNTER — Encounter: Payer: Self-pay | Admitting: Cardiovascular Disease

## 2019-03-05 VITALS — BP 148/70 | HR 64 | Ht 67.0 in | Wt 157.6 lb

## 2019-03-05 DIAGNOSIS — E7800 Pure hypercholesterolemia, unspecified: Secondary | ICD-10-CM

## 2019-03-05 DIAGNOSIS — I2510 Atherosclerotic heart disease of native coronary artery without angina pectoris: Secondary | ICD-10-CM

## 2019-03-05 DIAGNOSIS — I10 Essential (primary) hypertension: Secondary | ICD-10-CM

## 2019-03-05 NOTE — Patient Instructions (Signed)
Medication Instructions:  Your physician recommends that you continue on your current medications as directed. Please refer to the Current Medication list given to you today.  *If you need a refill on your cardiac medications before your next appointment, please call your pharmacy*  Lab Work: None ordered  If you have labs (blood work) drawn today and your tests are completely normal, you will receive your results only by: . MyChart Message (if you have MyChart) OR . A paper copy in the mail If you have any lab test that is abnormal or we need to change your treatment, we will call you to review the results.  Testing/Procedures: None ordered  Follow-Up: At [facility], you and your health needs are our priority.  As part of our continuing mission to provide you with exceptional heart care, we have created designated Provider Care Teams.  These Care Teams include your primary Cardiologist (physician) and Advanced Practice Providers (APPs -  Physician Assistants and Nurse Practitioners) who all work together to provide you with the care you need, when you need it.  Your next appointment:   12 month(s)  The format for your next appointment:   In Person  Provider:   You may see Christopher McAlhany, MD or one of the following Advanced Practice Providers on your designated Care Team:    Dayna Dunn, PA-C  Michele Lenze, PA-C   Other Instructions   

## 2019-03-05 NOTE — Progress Notes (Signed)
Chief Complaint  Patient presents with   Follow-up    CAD    History of Present Illness: 85 yo female with history of CAD, HTN, HLD, neuropathy and DM  who is here today for cardiac follow up. She had a Taxus drug eluting stent placed in the LAD in 2006. She was admitted to [facility] August 2015 with chest pain. Cardiac cath 12/14/13 per Dr. Martinique and found to have severe mid Circumflex stenosis. A drug eluting stent was placed in the mid Circumflex. She was noted to have moderate RCA disease, mild LAD disease and moderate to severe small caliber diagonal disease. Echo January 2020 with LVEF=55-60%. No valve disease.   She is here today for follow up. The patient denies any chest pain, dyspnea, palpitations, lower extremity edema, orthopnea, PND, dizziness, near syncope or syncope.   Primary Care Physician: Fransisca Connors, PA-C  Past Medical History:  Diagnosis Date   Anemia    Anemia, iron deficiency    CAD (coronary artery disease)    status Taxus drug-eluding stent to the LAD December 2006   Chronic renal disease    mild   Dermatitis 11/23/2012   Diabetes mellitus type II    Diverticulosis of colon    Edema    lower extremity with normal BNP   Encounter for long-term (current) use of medications    GERD (gastroesophageal reflux disease)    Glossitis    Hyperkalemia    Hyperlipidemia    Hypertension    Leg pain, left    Osteopenia    Pain in foot    bilateral   Polyneuropathy    Pyuria    Routine general medical examination at a health care facility    UTI (urinary tract infection)     Past Surgical History:  Procedure Laterality Date   CARDIOVASCULAR STRESS TEST  02/24/2006   CHOLECYSTECTOMY     ELECTROCARDIOGRAM  06/18/2006   ESOPHAGOGASTRODUODENOSCOPY  04/15/2006   LEFT HEART CATHETERIZATION WITH CORONARY ANGIOGRAM N/A 12/14/2013   Procedure: LEFT HEART CATHETERIZATION WITH CORONARY ANGIOGRAM;  Surgeon: Peter M Martinique, MD;  Location: [facility] CATH  LAB;  Service: Cardiovascular;  Laterality: N/A;   PTCA     stent to LAD in 03/2005, recatheterization inJanuarry 2007 for recurrent chest pain  that showed a patent LAD stent with normal LV function   TOTAL ABDOMINAL HYSTERECTOMY  1969   no BSO    Current Outpatient Medications  Medication Sig Dispense Refill   Blood Glucose Monitoring Suppl (ONE TOUCH ULTRA MINI) W/DEVICE KIT Use to check blood sugar twice a day.  DX 250.00 1 each 0   calcium carbonate (TUMS - DOSED IN MG ELEMENTAL CALCIUM) 500 MG chewable tablet Chew 1 tablet by mouth 3 (three) times daily as needed. For acid reflux      carvedilol (COREG) 6.25 MG tablet Take 6.25 mg by mouth daily.     clopidogrel (PLAVIX) 75 MG tablet TAKE ONE TABLET BY MOUTH ONCE DAILY WITH BREAKFAST 90 tablet 3   Dulaglutide 0.75 MG/0.5ML SOPN Inject 0.75 mg into the skin every 7 (seven) days.     fenofibrate 54 MG tablet Take 1 tablet (54 mg total) by mouth daily. 90 tablet 1   glipiZIDE (GLUCOTROL XL) 10 MG 24 hr tablet Take 10 mg by mouth daily before breakfast.     glucose blood test strip ONE TOUCH ULTRA TEST STRIP. Use as instructed to check blood sugar twice a day.  Dx 250.00  Insulin Glargine (LANTUS SOLOSTAR) 100 UNIT/ML Solostar Pen Inject 20 Units into the skin at bedtime.     °• Insulin Pen Needle 31G X 8 MM MISC 1 each by Does not apply route 2 (two) times daily before a meal. 100 each 1  °• lisinopril (PRINIVIL,ZESTRIL) 2.5 MG tablet Take 2.5 mg by mouth daily.    °• lovastatin (MEVACOR) 40 MG tablet Take 40 mg by mouth at bedtime.    °• nitroGLYCERIN (NITROSTAT) 0.4 MG SL tablet Place 0.4 mg under the tongue every 5 (five) minutes as needed for chest pain.    ° °No current facility-administered medications for this visit.   ° ° °Allergies  °Allergen Reactions  °• Sulfonamide Derivatives Hives  °• Pioglitazone Other (See Comments)  °  REACTION: Edema °Actos.  ° ° °Social History  ° °Socioeconomic History  °• Marital status: Married   °  Spouse name: Not on file  °• Number of children: Not on file  °• Years of education: Not on file  °• Highest education level: Not on file  °Occupational History  °• Not on file  °Social Needs  °• Financial resource strain: Not on file  °• Food insecurity  °  Worry: Not on file  °  Inability: Not on file  °• Transportation needs  °  Medical: Not on file  °  Non-medical: Not on file  °Tobacco Use  °• Smoking status: Never Smoker  °• Smokeless tobacco: Never Used  °Substance and Sexual Activity  °• Alcohol use: No  °• Drug use: No  °• Sexual activity: Not on file  °Lifestyle  °• Physical activity  °  Days per week: Not on file  °  Minutes per session: Not on file  °• Stress: Not on file  °Relationships  °• Social connections  °  Talks on phone: Not on file  °  Gets together: Not on file  °  Attends religious service: Not on file  °  Active member of club or organization: Not on file  °  Attends meetings of clubs or organizations: Not on file  °  Relationship status: Not on file  °• Intimate partner violence  °  Fear of current or ex partner: Not on file  °  Emotionally abused: Not on file  °  Physically abused: Not on file  °  Forced sexual activity: Not on file  °Other Topics Concern  °• Not on file  °Social History Narrative  ° retired - from part- time gov work  ° married- 55 years  ° Never Smoked  ° Alcohol use-no    ° 3 children      °   °   ° ° °Family History  °Problem Relation Age of Onset  °• Heart disease Mother   °• Heart disease Sister   ° ° °Review of Systems:  As stated in the HPI and otherwise negative.  ° °BP (!) 148/70    Pulse 64    Ht 5' 7" (1.702 m)    Wt 157 lb 9.6 oz (71.5 kg)    SpO2 99%    BMI 24.68 kg/m²  ° °Physical Examination: ° °General: Well developed, well nourished, NAD  °HEENT: OP clear, mucus membranes moist  °SKIN: warm, dry. No rashes. °Neuro: No focal deficits  °Musculoskeletal: Muscle strength 5/5 all ext  °Psychiatric: Mood and affect normal  °Neck: No JVD, no carotid bruits,  no thyromegaly, no lymphadenopathy.  °Lungs:Clear bilaterally, no wheezes, rhonci, crackles °Cardiovascular:   Regular rate and rhythm. No murmurs, gallops or rubs. °Abdomen:Soft. Bowel sounds present. Non-tender.  °Extremities: No lower extremity edema. Pulses are 2 + in the bilateral DP/PT. ° °Echo January 2020:  ° 1. The left ventricle has normal size and 55-60% ejection fraction. ° 2. The right ventricle has normal size and normal systolic function. ° 3. Normal left atrial size. ° 4. Normal right atrial size. ° 5. Normal tricuspid valve. ° 6. Aortic valve normal. ° 7. The mitral valve is normal in structure and function. ° 8. No atrial level shunt detected by color flow Doppler. ° °Cardiac cath 12/14/13: °Left mainstem: Normal.  °Left anterior descending (LAD): The stent in the mid LAD has diffuse 20-30% in stent disease. The first diagonal is small to moderate in size with 80% proximal stenosis. The second diagonal is tiny with 95% ostial disease.  °Left circumflex (LCx): The LCx gives rise to a single OM branch. There is a 99% stenosis in the mid LCx.  °Right coronary artery (RCA): The RCA is a dominant vessel. There is diffuse disease in the mid vessel to 50%. The distal vessel is diffusely diseased to 40%. The PDA has a 50-70% stenosis in the proximal vessel. There is 50-60% disease prior to PLOM2.  °Left ventriculography: not done  °PCI Note: Following the diagnostic procedure, the decision was made to proceed with PCI of the LCx. Plavix 600 mg was given orally. Weight-based bivalirudin was given for anticoagulation. Once a therapeutic ACT was achieved, a 6 [place] XBLAD 3.5 guide catheter was inserted. A prowater coronary guidewire was used to cross the lesion. The lesion was predilated with a 2.0 mm balloon. The lesion was then stented with a 2.25 x 28 mm Promus stent. The stent was postdilated with a 2.5 mm noncompliant balloon. Following PCI, there was 0% residual stenosis and TIMI-3 flow. Final angiography  confirmed an excellent result. The patient tolerated the procedure well. There were no immediate procedural complications. A TR band was used for radial hemostasis. The patient was transferred to the post catheterization recovery area for further monitoring. ° °EKG:  EKG is ordered today. °The ekg ordered today demonstrates NSR, rate 64 bpm ° °Recent Labs: °No results found for requested labs within last 8760 hours.  °  °Wt Readings from Last 3 Encounters:  °03/05/19 157 lb 9.6 oz (71.5 kg)  °04/17/18 160 lb 12.8 oz (72.9 kg)  °04/11/17 159 lb (72.1 kg)  °  ° °Other studies Reviewed: °Additional studies/ records that were reviewed today include: . °Review of the above records demonstrates:  ° ° °Assessment and Plan:  ° °1. CAD without angina: She has no chest pain. LV systolic function normal by echo January 2020. Continue Plavix (first generation drug eluting stent). Continue statin and beta blocker.   ° °2. HTN: BP controlled.  ° °3. Hyperlipidemia: Lipids followed in primary care. Continue statin ° °Current medicines are reviewed at length with the patient today.  The patient does not have concerns regarding medicines. ° °The following changes have been made:  no change ° °Labs/ tests ordered today include:  ° °Orders Placed This Encounter  °Procedures  °• EKG 12-Lead  ° ° °Disposition:   FU with me in 12  months ° °Signed, ° , MD °03/05/2019 11:58 AM    °[facility] °[address], [place], [place]  [address] °Phone: [phone]; Fax: [phone]  °

## 2019-04-19 ENCOUNTER — Other Ambulatory Visit: Payer: Self-pay | Admitting: Cardiovascular Disease

## 2019-07-27 ENCOUNTER — Other Ambulatory Visit: Payer: Self-pay

## 2019-07-27 ENCOUNTER — Emergency Department (HOSPITAL_COMMUNITY)
Admission: EM | Admit: 2019-07-27 | Discharge: 2019-07-27 | Disposition: A | Discharge status: Home / Self Care | Payer: Medicare Other | Attending: Emergency Medicine | Admitting: Emergency Medicine

## 2019-07-27 DIAGNOSIS — E1122 Type 2 diabetes mellitus with diabetic chronic kidney disease: Secondary | ICD-10-CM | POA: Insufficient documentation

## 2019-07-27 DIAGNOSIS — R55 Syncope and collapse: Secondary | ICD-10-CM | POA: Diagnosis present

## 2019-07-27 DIAGNOSIS — I951 Orthostatic hypotension: Secondary | ICD-10-CM | POA: Diagnosis not present

## 2019-07-27 DIAGNOSIS — I2510 Atherosclerotic heart disease of native coronary artery without angina pectoris: Secondary | ICD-10-CM | POA: Insufficient documentation

## 2019-07-27 DIAGNOSIS — Z79899 Other long term (current) drug therapy: Secondary | ICD-10-CM | POA: Diagnosis not present

## 2019-07-27 DIAGNOSIS — Z794 Long term (current) use of insulin: Secondary | ICD-10-CM | POA: Diagnosis not present

## 2019-07-27 DIAGNOSIS — Z7902 Long term (current) use of antithrombotics/antiplatelets: Secondary | ICD-10-CM | POA: Diagnosis not present

## 2019-07-27 DIAGNOSIS — N189 Chronic kidney disease, unspecified: Secondary | ICD-10-CM | POA: Insufficient documentation

## 2019-07-27 DIAGNOSIS — I129 Hypertensive chronic kidney disease with stage 1 through stage 4 chronic kidney disease, or unspecified chronic kidney disease: Secondary | ICD-10-CM | POA: Insufficient documentation

## 2019-07-27 LAB — BASIC METABOLIC PANEL
Anion gap: 11 (ref 5–15)
BUN: 25 mg/dL — ABNORMAL HIGH (ref 8–23)
CO2: 21 mmol/L — ABNORMAL LOW (ref 22–32)
Calcium: 8.8 mg/dL — ABNORMAL LOW (ref 8.9–10.3)
Chloride: 110 mmol/L (ref 98–111)
Creatinine, Ser: 1.42 mg/dL — ABNORMAL HIGH (ref 0.44–1.00)
GFR calc Af Amer: 39 mL/min — ABNORMAL LOW (ref >60)
GFR calc non Af Amer: 34 mL/min — ABNORMAL LOW (ref >60)
Glucose, Bld: 158 mg/dL — ABNORMAL HIGH (ref 70–99)
Potassium: 4.4 mmol/L (ref 3.5–5.1)
Sodium: 142 mmol/L (ref 135–145)

## 2019-07-27 LAB — CBG MONITORING, ED: Glucose-Capillary: 134 mg/dL — ABNORMAL HIGH (ref 70–99)

## 2019-07-27 NOTE — ED Provider Notes (Signed)
[facility] EMERGENCY DEPARTMENT Provider Note   CSN: [id number] Arrival date & time: 07/27/19  1219     History Chief Complaint  Patient presents with  . Near Syncope    [name] is a 86 y.o. female.  [name] is an 86 year old female with a history of CAD status post 2 stent placements (2006 & 2015), T2DM with severe peripheral neuropathy, GERD, HTN, and HLD who presents after a near syncopal episode.  The patient states that she was making grits this morning. After her grits were done cooking, she tried walking to her chair but felt poorly, weak, then her legs gave out prior to sitting in the chair. She fell to her knees before she could make it to the chair. Her husband witnessed the event.  She did not hit her head, no LOC. He states that she was responsive to his questions, but she seemed like she was a little "off".  He gave her some strawberry jam thinking that she may have had low blood sugars, but the patient's blood sugars were in the 80s earlier in the morning about an hour prior. Pt was orthostatic when EMS came to the house. Patient denies any chest pain, SOB, abdominal pain, changes in her bowel or bladder habits.  The patient and her husband state that she is currently at her baseline.  The patient does not use any assistive devices to ambulate, but states that she and her husband have multiple canes and walkers at the house.       Past Medical History:  Diagnosis Date  . Anemia   . Anemia, iron deficiency   . CAD (coronary artery disease)    status Taxus drug-eluding stent to the LAD December 2006  . Chronic renal disease    mild  . Dermatitis 11/23/2012  . Diabetes mellitus type II   . Diverticulosis of colon   . Edema    lower extremity with normal BNP  . Encounter for long-term (current) use of medications   . GERD (gastroesophageal reflux disease)   . Glossitis   . Hyperkalemia   . Hyperlipidemia   . Hypertension   . Leg  pain, left   . Osteopenia   . Pain in foot    bilateral  . Polyneuropathy   . Pyuria   . Routine general medical examination at a health care facility   . UTI (urinary tract infection)    Patient Active Problem List   Diagnosis Date Noted  . Unstable angina ([place]) 12/14/2013  . Chest pain 12/12/2013  . Viral URI with cough 05/06/2013  . Dermatitis 11/23/2012  . Headache(784.0) 03/14/2012  . VITAMIN B12 DEFICIENCY 05/12/2009  . NEUROPATHY 05/03/2009  . ANEMIA 03/31/2009  . GERD 03/31/2009  . EDEMA 03/31/2009  . RENAL DISEASE, CHRONIC, MILD 01/06/2008  . ANEMIA, IRON DEFICIENCY 08/31/2007  . Pain in limb 04/20/2007  . DIABETES MELLITUS, TYPE II 11/21/2006  . HYPERLIPIDEMIA 11/21/2006  . HYPERTENSION 11/21/2006  . CORONARY ARTERY DISEASE 11/21/2006  . DIVERTICULOSIS, COLON 11/21/2006  . OSTEOPENIA 11/21/2006   Past Surgical History:  Procedure Laterality Date  . CARDIOVASCULAR STRESS TEST  02/24/2006  . CHOLECYSTECTOMY    . ELECTROCARDIOGRAM  06/18/2006  . ESOPHAGOGASTRODUODENOSCOPY  04/15/2006  . LEFT HEART CATHETERIZATION WITH CORONARY ANGIOGRAM N/A 12/14/2013   Procedure: LEFT HEART CATHETERIZATION WITH CORONARY ANGIOGRAM;  Surgeon: Peter M Martinique, MD;  Location: [facility] CATH LAB;  Service: Cardiovascular;  Laterality: N/A;  . PTCA  stent to LAD in 03/2005, recatheterization inJanuarry 2007 for recurrent chest pain  that showed a patent LAD stent with normal LV function  . TOTAL ABDOMINAL HYSTERECTOMY  1969   no BSO    OB History   No obstetric history on file.    Family History  Problem Relation Age of Onset  . Heart disease Mother   . Heart disease Sister    Social History   Tobacco Use  . Smoking status: Never Smoker  . Smokeless tobacco: Never Used  Substance Use Topics  . Alcohol use: No  . Drug use: No   Home Medications Prior to Admission medications   Medication Sig Start Date End Date Taking? Authorizing Provider  Blood Glucose Monitoring Suppl  (ONE TOUCH ULTRA MINI) W/DEVICE KIT Use to check blood sugar twice a day.  DX 250.00 03/24/13   Mosie Lukes, MD  calcium carbonate (TUMS - DOSED IN MG ELEMENTAL CALCIUM) 500 MG chewable tablet Chew 1 tablet by mouth 3 (three) times daily as needed. For acid reflux     [provider]  carvedilol (COREG) 6.25 MG tablet Take 6.25 mg by mouth daily. 01/10/15   [provider]  clopidogrel (PLAVIX) 75 MG tablet Take 1 tablet by mouth once daily with breakfast 04/19/19   Burnell Blanks, MD  Dulaglutide 0.75 MG/0.5ML SOPN Inject 0.75 mg into the skin every 7 (seven) days. 07/02/17   [provider]  fenofibrate 54 MG tablet Take 1 tablet (54 mg total) by mouth daily. 11/13/12   Mosie Lukes, MD  glipiZIDE (GLUCOTROL XL) 10 MG 24 hr tablet Take 10 mg by mouth daily before breakfast. 12/12/15   [provider]  glucose blood test strip ONE TOUCH ULTRA TEST STRIP. Use as instructed to check blood sugar twice a day.  Dx 250.00 03/24/13   Mosie Lukes, MD  Insulin Glargine (LANTUS SOLOSTAR) 100 UNIT/ML Solostar Pen Inject 20 Units into the skin at bedtime.  05/24/15   [provider]  Insulin Pen Needle 31G X 8 MM MISC 1 each by Does not apply route 2 (two) times daily before a meal. 06/27/11   Hodgin, Mora Appl, MD  lisinopril (PRINIVIL,ZESTRIL) 2.5 MG tablet Take 2.5 mg by mouth daily.    [provider]  lovastatin (MEVACOR) 40 MG tablet Take 40 mg by mouth at bedtime.    [provider]  nitroGLYCERIN (NITROSTAT) 0.4 MG SL tablet Place 0.4 mg under the tongue every 5 (five) minutes as needed for chest pain.    [provider]   Allergies    Sulfonamide derivatives and Pioglitazone  Review of Systems   Review of Systems  All other systems reviewed and are negative.  Physical Exam Updated Vital Signs BP (!) 186/63   Pulse (!) 53   Temp 97.9 F (36.6 C) (Oral)   Resp 19   SpO2 100%   Physical Exam Vitals reviewed.   Constitutional:      General: She is not in acute distress.    Appearance: Normal appearance. She is normal weight. She is not ill-appearing, toxic-appearing or diaphoretic.  HENT:     Head: Normocephalic and atraumatic.  Eyes:     Extraocular Movements: Extraocular movements intact.  Cardiovascular:     Rate and Rhythm: Normal rate and regular rhythm.     Pulses: Normal pulses.     Heart sounds: Normal heart sounds. No murmur. No friction rub. No gallop.   Pulmonary:  Effort: Pulmonary effort is normal. No respiratory distress.     Breath sounds: Normal breath sounds. No wheezing or rales.  Abdominal:     General: Abdomen is flat. Bowel sounds are normal. There is no distension.     Palpations: Abdomen is soft.     Tenderness: There is no abdominal tenderness. There is no guarding.  Musculoskeletal:     Right lower leg: Edema (1+ to mid shin) present.     Left lower leg: Edema (1+ to mid shin) present.  Skin:    General: Skin is warm.  Neurological:     Mental Status: She is alert and oriented to person, place, and time.     Sensory: Sensory deficit (Pt has no sensation up to her knees bilaterally) present.     Motor: No weakness.  Psychiatric:        Mood and Affect: Mood normal.    ED Results / Procedures / Treatments   Labs (all labs ordered are listed, but only abnormal results are displayed) Labs Reviewed - No data to display  EKG EKG Interpretation  Date/Time:  Tuesday July 27 2019 09:07:34 EDT Ventricular Rate:  60 PR Interval:    QRS Duration: 92 QT Interval:  448 QTC Calculation: 448 R Axis:   18 Text Interpretation: Sinus rhythm Atrial premature complex Low voltage, precordial leads No significant change since last tracing Confirmed by Blanchie Dessert [phone]) on 07/27/2019 9:26:04 AM  Radiology No results found.  Procedures Procedures (including critical care time)  Medications Ordered in ED Medications - No data to display  ED Course  I  have reviewed the triage vital signs and the nursing notes.  Pertinent labs & imaging results that were available during my care of the patient were reviewed by me and considered in my medical decision making (see chart for details).    MDM Rules/Calculators/A&P                      Ms. Brunsman has a hx of CAD w/ 2 stents placed in the past, and M1DQ complicated by severe peripheral neuropathy who presents with a fall/prensycopal event. The patient stated that she was standing up, making grits her kitchen, then decided to sit down.Before she could make it to the chair, her legs felt weak and fell to her knees.  She had no seizure like activity, or any focal weakness or slurred speech to suggest seizure or TIA. I suspect the patient may have been hypoglycemic or was orthostatic, especially given her positive orthostatic vital signs obtained by EMS. Given her extensive cardiovascular hx, we will obtain a CBC, BMP to look for any electrolyte derangements as well as repeat orthostatic vital signs. EKG unremarkable at this time.   Patient had positive orthostatic vital signs while in the ED, but was asymptomatic during testing.  She states she feels that she is in her normal state of health and would like to go home.  Given the fact that she was asymptomatic, I think she is medically stable for discharge. She states she will call her PCP as soon as she leaves here to schedule an appointment to address her orthostatic hypotension.  Final Clinical Impression(s) / ED Diagnoses Final diagnoses:  Orthostatic hypotension   Rx / DC Orders ED Discharge Orders    None       Earlene Plater, MD 07/27/19 1256    Blanchie Dessert, MD 07/27/19 1323

## 2019-07-27 NOTE — ED Triage Notes (Signed)
Pt BIB [facility] from home following period of 4-5 mins of slurred speech, unresponsive to voice, observed by husband. Pt was hot/flushed and sat herself on floor prior to episode. Per EMS, pt systolic BP was 120 in left arm and 190 in right upon their arrival. Pt equally hypertensive at 206/87, A&Ox4 at this time. Hx of HTN, controlled w/ meds, took lisinopril this am. VS stable otherwise, CBG 115 per EMS, NAD noted.

## 2019-07-27 NOTE — Discharge Instructions (Addendum)
Thank you for letting us take care of you during hospitalization.  Below is a summary of what we discussed:  1.  Orthostatic hypotension -This means that your blood pressure changes greatly when you stand up from a seated position.  This could predispose you to falling -Please do your best to stay hydrated during the day. -If you start feeling dizzy, it may be a sign of blood pressures are low.  Lay down until the feeling goes away.  2.  Follow-up -Schedule follow-up appointment to address this with your primary provider as soon as possible.

## 2019-12-09 ENCOUNTER — Emergency Department (HOSPITAL_COMMUNITY): Payer: Medicare Other

## 2019-12-09 ENCOUNTER — Emergency Department (HOSPITAL_COMMUNITY)
Admission: EM | Admit: 2019-12-09 | Discharge: 2019-12-10 | Disposition: A | Discharge status: Home / Self Care | Payer: Medicare Other | Source: Home / Self Care | Attending: Emergency Medicine | Admitting: Emergency Medicine

## 2019-12-09 ENCOUNTER — Other Ambulatory Visit: Payer: Self-pay

## 2019-12-09 ENCOUNTER — Encounter (HOSPITAL_COMMUNITY): Payer: Self-pay | Admitting: Student

## 2019-12-09 DIAGNOSIS — N189 Chronic kidney disease, unspecified: Secondary | ICD-10-CM | POA: Insufficient documentation

## 2019-12-09 DIAGNOSIS — U071 COVID-19: Secondary | ICD-10-CM

## 2019-12-09 DIAGNOSIS — I25110 Atherosclerotic heart disease of native coronary artery with unstable angina pectoris: Secondary | ICD-10-CM | POA: Insufficient documentation

## 2019-12-09 DIAGNOSIS — E1122 Type 2 diabetes mellitus with diabetic chronic kidney disease: Secondary | ICD-10-CM | POA: Insufficient documentation

## 2019-12-09 DIAGNOSIS — E1140 Type 2 diabetes mellitus with diabetic neuropathy, unspecified: Secondary | ICD-10-CM | POA: Insufficient documentation

## 2019-12-09 DIAGNOSIS — R531 Weakness: Secondary | ICD-10-CM | POA: Diagnosis not present

## 2019-12-09 DIAGNOSIS — Z79899 Other long term (current) drug therapy: Secondary | ICD-10-CM | POA: Insufficient documentation

## 2019-12-09 DIAGNOSIS — Z794 Long term (current) use of insulin: Secondary | ICD-10-CM | POA: Insufficient documentation

## 2019-12-09 DIAGNOSIS — I129 Hypertensive chronic kidney disease with stage 1 through stage 4 chronic kidney disease, or unspecified chronic kidney disease: Secondary | ICD-10-CM | POA: Insufficient documentation

## 2019-12-09 LAB — URINALYSIS, ROUTINE W REFLEX MICROSCOPIC
Bilirubin Urine: NEGATIVE
Glucose, UA: >=500 mg/dL — AB
Ketones, ur: NEGATIVE mg/dL
Nitrite: NEGATIVE
Protein, ur: NEGATIVE mg/dL
Specific Gravity, Urine: 1.013 (ref 1.005–1.030)
pH: 5 (ref 5.0–8.0)

## 2019-12-09 LAB — TROPONIN I (HIGH SENSITIVITY): Troponin I (High Sensitivity): 84 ng/L — ABNORMAL HIGH (ref <18)

## 2019-12-09 LAB — COMPREHENSIVE METABOLIC PANEL
ALT: 30 U/L (ref 0–44)
AST: 52 U/L — ABNORMAL HIGH (ref 15–41)
Albumin: 3.3 g/dL — ABNORMAL LOW (ref 3.5–5.0)
Alkaline Phosphatase: 46 U/L (ref 38–126)
Anion gap: 9 (ref 5–15)
BUN: 29 mg/dL — ABNORMAL HIGH (ref 8–23)
CO2: 22 mmol/L (ref 22–32)
Calcium: 8.7 mg/dL — ABNORMAL LOW (ref 8.9–10.3)
Chloride: 104 mmol/L (ref 98–111)
Creatinine, Ser: 1.69 mg/dL — ABNORMAL HIGH (ref 0.44–1.00)
GFR calc Af Amer: 32 mL/min — ABNORMAL LOW (ref >60)
GFR calc non Af Amer: 27 mL/min — ABNORMAL LOW (ref >60)
Glucose, Bld: 214 mg/dL — ABNORMAL HIGH (ref 70–99)
Potassium: 4.5 mmol/L (ref 3.5–5.1)
Sodium: 135 mmol/L (ref 135–145)
Total Bilirubin: 0.6 mg/dL (ref 0.3–1.2)
Total Protein: 6.3 g/dL — ABNORMAL LOW (ref 6.5–8.1)

## 2019-12-09 LAB — CBC WITH DIFFERENTIAL/PLATELET
Abs Immature Granulocytes: 0.01 10*3/uL (ref 0.00–0.07)
Basophils Absolute: 0 10*3/uL (ref 0.0–0.1)
Basophils Relative: 1 %
Eosinophils Absolute: 0 10*3/uL (ref 0.0–0.5)
Eosinophils Relative: 0 %
HCT: 40 % (ref 36.0–46.0)
Hemoglobin: 12.8 g/dL (ref 12.0–15.0)
Immature Granulocytes: 0 %
Lymphocytes Relative: 31 %
Lymphs Abs: 1.6 10*3/uL (ref 0.7–4.0)
MCH: 29.2 pg (ref 26.0–34.0)
MCHC: 32 g/dL (ref 30.0–36.0)
MCV: 91.3 fL (ref 80.0–100.0)
Monocytes Absolute: 1 10*3/uL (ref 0.1–1.0)
Monocytes Relative: 21 %
Neutro Abs: 2.4 10*3/uL (ref 1.7–7.7)
Neutrophils Relative %: 47 %
Platelets: 191 10*3/uL (ref 150–400)
RBC: 4.38 MIL/uL (ref 3.87–5.11)
RDW: 12.4 % (ref 11.5–15.5)
WBC: 5.1 10*3/uL (ref 4.0–10.5)
nRBC: 0 % (ref 0.0–0.2)

## 2019-12-09 LAB — CBG MONITORING, ED: Glucose-Capillary: 177 mg/dL — ABNORMAL HIGH (ref 70–99)

## 2019-12-09 LAB — SARS CORONAVIRUS 2 BY RT PCR (HOSPITAL ORDER, PERFORMED IN ~~LOC~~ HOSPITAL LAB): SARS Coronavirus 2: POSITIVE — AB

## 2019-12-09 MED ORDER — SODIUM CHLORIDE 0.9 % IV BOLUS
500.0000 mL | Freq: Once | INTRAVENOUS | Status: AC
  Administered 2019-12-10: 500 mL via INTRAVENOUS

## 2019-12-09 MED ORDER — ACETAMINOPHEN 325 MG PO TABS
650.0000 mg | ORAL_TABLET | Freq: Once | ORAL | Status: AC
  Administered 2019-12-09: 650 mg via ORAL
  Filled 2019-12-09: qty 2

## 2019-12-09 NOTE — ED Notes (Signed)
Pt ambulated to the bathroom with assistance from RN and NT. Difficulty moving positions requiring assistance. Pt too weak to stand. Gait unstable with shuffling.

## 2019-12-09 NOTE — Discharge Instructions (Addendum)
You have tested POSITIVE for Covid 19 today. Please self isolate for 10 days. Continue monitoring your symptoms at home. Take Tylenol as needed for fevers.   Home Health orders have been placed for physical therapy to help you get around your house given generalized weakness recently which is likely secondary to the covid infection.   I have informed the [facility] about you and they will contact you regarding information about monoclonal antibodies.   Please follow up with your PCP regarding your ED visit today  Return to the ED IMMEDIATELY for any worsening symptoms including shortness of breath, severe chest pain, passing out, lips turning blue, or any other new/concerning symptoms.

## 2019-12-09 NOTE — ED Provider Notes (Signed)
[facility] EMERGENCY DEPARTMENT Provider Note   CSN: [id number] Arrival date & time: 12/09/19  1746     History Chief Complaint  Patient presents with  . Weakness    [name] is a 86 y.o. female with PMHx anemia, CAD s/p stent, CKD, diabetes, GERD, HTN, HLD, polyneuropathy who presents to the ED via EMS for generalized weakness.  Per triage report patient has had increased falls with transitioning.  Fall was last night and patient landed on her left shoulder, unknown if patient hit her head.  EMS states the family members told them patient has been lethargic and slow to respond.  Patient is anticoagulated.   Per patient she reports that she has a history of neuropathy for the past 2 to 3 days her legs have been feeling very heavy.  States this has caused her to have difficulty ambulating causing her to fall recently.  She states she believes she fell twice in the past couple of days once landing on her shoulder.  She denies any head injury however daughter shortly arrives at bedside and states that she did hit her head yesterday morning after falling in the bathroom.  EMS was called at that time and evaluated patient and gave the all clear.  Reports that patient fell again last night and is unsure where she landed however she has been complaining of some left shoulder pain.  She states that patient has seemed generally more weak than normal.  She has not noticed any slurred speech, garbled speech, facial droop.  No history of strokes.  Patient and daughter were unaware patient had a fever, temperature in the ED 100.9.  She is vaccinated against COVID-19.  She denies any chills, cough, shortness of breath, abdominal pain, urinary symptoms, diarrhea, nausea, vomiting, any other associated symptoms.   The history is provided by the patient, medical records and a relative.       Past Medical History:  Diagnosis Date  . Anemia   . Anemia, iron deficiency   . CAD  (coronary artery disease)    status Taxus drug-eluding stent to the LAD December 2006  . Chronic renal disease    mild  . Dermatitis 11/23/2012  . Diabetes mellitus type II   . Diverticulosis of colon   . Edema    lower extremity with normal BNP  . Encounter for long-term (current) use of medications   . GERD (gastroesophageal reflux disease)   . Glossitis   . Hyperkalemia   . Hyperlipidemia   . Hypertension   . Leg pain, left   . Osteopenia   . Pain in foot    bilateral  . Polyneuropathy   . Pyuria   . Routine general medical examination at a health care facility   . UTI (urinary tract infection)     Patient Active Problem List   Diagnosis Date Noted  . Unstable angina ([place]) 12/14/2013  . Chest pain 12/12/2013  . Viral URI with cough 05/06/2013  . Dermatitis 11/23/2012  . Headache(784.0) 03/14/2012  . VITAMIN B12 DEFICIENCY 05/12/2009  . NEUROPATHY 05/03/2009  . ANEMIA 03/31/2009  . GERD 03/31/2009  . EDEMA 03/31/2009  . RENAL DISEASE, CHRONIC, MILD 01/06/2008  . ANEMIA, IRON DEFICIENCY 08/31/2007  . Pain in limb 04/20/2007  . DIABETES MELLITUS, TYPE II 11/21/2006  . HYPERLIPIDEMIA 11/21/2006  . HYPERTENSION 11/21/2006  . CORONARY ARTERY DISEASE 11/21/2006  . DIVERTICULOSIS, COLON 11/21/2006  . OSTEOPENIA 11/21/2006    Past Surgical History:  Procedure  Laterality Date  . CARDIOVASCULAR STRESS TEST  02/24/2006  . CHOLECYSTECTOMY    . ELECTROCARDIOGRAM  06/18/2006  . ESOPHAGOGASTRODUODENOSCOPY  04/15/2006  . LEFT HEART CATHETERIZATION WITH CORONARY ANGIOGRAM N/A 12/14/2013   Procedure: LEFT HEART CATHETERIZATION WITH CORONARY ANGIOGRAM;  Surgeon: Peter M Martinique, MD;  Location: [facility] CATH LAB;  Service: Cardiovascular;  Laterality: N/A;  . PTCA     stent to LAD in 03/2005, recatheterization inJanuarry 2007 for recurrent chest pain  that showed a patent LAD stent with normal LV function  . TOTAL ABDOMINAL HYSTERECTOMY  1969   no BSO     OB History   No obstetric  history on file.     Family History  Problem Relation Age of Onset  . Heart disease Mother   . Heart disease Sister     Social History   Tobacco Use  . Smoking status: Never Smoker  . Smokeless tobacco: Never Used  Substance Use Topics  . Alcohol use: No  . Drug use: No    Home Medications Prior to Admission medications   Medication Sig Start Date End Date Taking? Authorizing Provider  augmented betamethasone dipropionate (DIPROLENE-AF) 0.05 % cream Apply 1 application topically at bedtime as needed for rash. 06/07/19   [provider]  Blood Glucose Monitoring Suppl (ONE TOUCH ULTRA MINI) W/DEVICE KIT Use to check blood sugar twice a day.  DX 250.00 03/24/13   Mosie Lukes, MD  calcium carbonate (TUMS - DOSED IN MG ELEMENTAL CALCIUM) 500 MG chewable tablet Chew 1 tablet by mouth daily as needed for indigestion.     [provider]  carvedilol (COREG) 6.25 MG tablet Take 6.25 mg by mouth in the morning and at bedtime.  01/10/15   [provider]  clopidogrel (PLAVIX) 75 MG tablet Take 1 tablet by mouth once daily with breakfast 04/19/19   Burnell Blanks, MD  fenofibrate 54 MG tablet Take 1 tablet (54 mg total) by mouth daily. 11/13/12   Mosie Lukes, MD  glipiZIDE (GLUCOTROL XL) 10 MG 24 hr tablet Take 10 mg by mouth daily before breakfast. 12/12/15   [provider]  glucose blood test strip ONE TOUCH ULTRA TEST STRIP. Use as instructed to check blood sugar twice a day.  Dx 250.00 03/24/13   Mosie Lukes, MD  Insulin Glargine (LANTUS SOLOSTAR) 100 UNIT/ML Solostar Pen Inject 20 Units into the skin at bedtime.  05/24/15   [provider]  Insulin Pen Needle 31G X 8 MM MISC 1 each by Does not apply route 2 (two) times daily before a meal. 06/27/11   Hodgin, Mora Appl, MD  lisinopril (PRINIVIL,ZESTRIL) 2.5 MG tablet Take 2.5 mg by mouth daily.    [provider]  lovastatin (MEVACOR) 40 MG tablet Take 40 mg by mouth at  bedtime.    [provider]  nitroGLYCERIN (NITROSTAT) 0.4 MG SL tablet Place 0.4 mg under the tongue every 5 (five) minutes as needed for chest pain.    [provider]  permethrin (ELIMITE) 5 % cream Apply 1 application topically at bedtime as needed for rash. 06/07/19   [provider]    Allergies    Corticosteroids, Sulfonamide derivatives, and Pioglitazone  Review of Systems   Review of Systems  Constitutional: Positive for fever. Negative for chills.  Respiratory: Negative for cough and shortness of breath.   Cardiovascular: Negative for chest pain.  Gastrointestinal: Negative for abdominal pain, diarrhea, nausea and vomiting.  Genitourinary: Negative for difficulty  urinating and dysuria.  Musculoskeletal: Positive for arthralgias (L shoulder).  Neurological: Positive for weakness (generalized).  All other systems reviewed and are negative.   Physical Exam Updated Vital Signs BP (!) 201/68 (BP Location: Right Arm)   Pulse 70   Temp (!) 100.9 F (38.3 C) (Oral)   Resp 15   Ht _0  (1.702 m)   Wt 68 kg   SpO2 98%   BMI 23.49 kg/m   Physical Exam Vitals and nursing note reviewed.  Constitutional:      Appearance: She is not ill-appearing or diaphoretic.  HENT:     Head: Normocephalic and atraumatic.     Comments: No raccoon's sign or battle's sign. Negative hemotympanum bilaterally.     Right Ear: Tympanic membrane normal.     Left Ear: Tympanic membrane normal.  Eyes:     Extraocular Movements: Extraocular movements intact.     Conjunctiva/sclera: Conjunctivae normal.     Pupils: Pupils are equal, round, and reactive to light.  Cardiovascular:     Rate and Rhythm: Normal rate and regular rhythm.     Pulses: Normal pulses.  Pulmonary:     Effort: Pulmonary effort is normal.     Breath sounds: Normal breath sounds. No wheezing, rhonchi or rales.  Abdominal:     Palpations: Abdomen is soft.     Tenderness: There is no abdominal  tenderness. There is no guarding or rebound.  Musculoskeletal:     Cervical back: Neck supple. No tenderness.     Comments: No ecchymosis noted to L shoulder. No deformity. + TTP. ROM intact. Strength 5/5 BUEs. Sensation intact. 2+ radial pulses bilaterally.   Decreased sensation bilaterally to LEs;  Unchanged from pt's baseline s/2 neuropathy. 5/5 strength bilaterally.   Skin:    General: Skin is warm and dry.  Neurological:     Mental Status: She is alert.     Comments: CN 3-12 grossly intact A&O x4 GCS 15 Coordination with finger-to-nose WNL Neg pronator drift     ED Results / Procedures / Treatments   Labs (all labs ordered are listed, but only abnormal results are displayed) Labs Reviewed  SARS CORONAVIRUS 2 BY RT PCR (HOSPITAL ORDER, [facility]) - Abnormal; Notable for the following components:      Result Value   SARS Coronavirus 2 POSITIVE (*)    All other components within normal limits  COMPREHENSIVE METABOLIC PANEL - Abnormal; Notable for the following components:   Glucose, Bld 214 (*)    BUN 29 (*)    Creatinine, Ser 1.69 (*)    Calcium 8.7 (*)    Total Protein 6.3 (*)    Albumin 3.3 (*)    AST 52 (*)    GFR calc non Af Amer 27 (*)    GFR calc Af Amer 32 (*)    All other components within normal limits  URINALYSIS, ROUTINE W REFLEX MICROSCOPIC - Abnormal; Notable for the following components:   APPearance HAZY (*)    Glucose, UA >=500 (*)    Hgb urine dipstick MODERATE (*)    Leukocytes,Ua MODERATE (*)    Bacteria, UA RARE (*)    All other components within normal limits  CBG MONITORING, ED - Abnormal; Notable for the following components:   Glucose-Capillary 177 (*)    All other components within normal limits  TROPONIN I (HIGH SENSITIVITY) - Abnormal; Notable for the following components:   Troponin I (High Sensitivity) 84 (*)  All other components within normal limits  CBC WITH DIFFERENTIAL/PLATELET  TROPONIN I (HIGH  SENSITIVITY)    EKG EKG Interpretation  Date/Time:  Thursday December 09 2019 18:57:41 EDT Ventricular Rate:  66 PR Interval:  152 QRS Duration: 84 QT Interval:  414 QTC Calculation: 434 R Axis:   13 Text Interpretation: Normal sinus rhythm Low voltage QRS Cannot rule out Anterior infarct , age undetermined Abnormal ECG No STEMI Confirmed by Octaviano Glow [phone]) on 12/09/2019 7:11:35 PM   Radiology DG Chest 2 View  Result Date: 12/09/2019 CLINICAL DATA:  Shoulder pain after fall EXAM: CHEST - 2 VIEW COMPARISON:  12/12/2013 FINDINGS: No focal opacity or pleural effusion. Cardiomediastinal silhouette within normal limits. No pneumothorax. IMPRESSION: No active cardiopulmonary disease. Electronically Signed   By: Donavan Foil M.D.   On: 12/09/2019 19:11   DG Scapula Left  Result Date: 12/09/2019 CLINICAL DATA:  86 year old female with concern for fracture of the left scapular neck. EXAM: LEFT SCAPULA - 2+ VIEWS COMPARISON:  Left shoulder radiograph dated 12/09/2019. FINDINGS: No acute fracture or dislocation. Focal indentation of the posterior wing of the scapula, likely chronic. The bones are osteopenic. The soft tissues are unremarkable. IMPRESSION: No acute fracture or dislocation. Electronically Signed   By: Anner Crete M.D.   On: 12/09/2019 20:06   CT Head Wo Contrast  Result Date: 12/09/2019 CLINICAL DATA:  Fall, on blood thinners. EXAM: CT HEAD WITHOUT CONTRAST TECHNIQUE: Contiguous axial images were obtained from the base of the skull through the vertex without intravenous contrast. COMPARISON:  CT head dated 03/11/2012. FINDINGS: Brain: Generalized age related parenchymal volume loss with commensurate dilatation of the ventricles and sulci. Chronic small vessel ischemic changes within the bilateral periventricular white matter regions. No mass, hemorrhage, edema or other evidence of acute parenchymal abnormality. No extra-axial hemorrhage. Vascular: Chronic calcified  atherosclerotic changes of the large vessels at the skull base. No unexpected hyperdense vessel. Skull: Normal. Negative for fracture or focal lesion. Sinuses/Orbits: No acute finding. Other: None. IMPRESSION: 1. No acute findings. No intracranial mass, hemorrhage or edema. No skull fracture. 2. Chronic small vessel ischemic changes in the white matter. Electronically Signed   By: Franki Cabot M.D.   On: 12/09/2019 18:39   DG Shoulder Left  Result Date: 12/09/2019 CLINICAL DATA:  Shoulder pain after fall EXAM: LEFT SHOULDER - 2+ VIEW COMPARISON:  None. FINDINGS: Mild irregular lucency at the scapular neck. AC joint is intact. No fracture or malalignment at the glenohumeral interval. IMPRESSION: Mild irregular lucency at the scapular neck is indeterminate for nondisplaced fracture. Electronically Signed   By: Donavan Foil M.D.   On: 12/09/2019 19:10   DG Humerus Left  Result Date: 12/09/2019 CLINICAL DATA:  Shoulder pain after fall EXAM: LEFT HUMERUS - 2+ VIEW COMPARISON:  None. FINDINGS: There is no evidence of fracture or other focal bone lesions. Soft tissues are unremarkable. Intravenous catheter at the at the antecubital fossa IMPRESSION: No acute osseous abnormality Electronically Signed   By: Donavan Foil M.D.   On: 12/09/2019 19:12    Procedures Procedures (including critical care time)  Medications Ordered in ED Medications  sodium chloride 0.9 % bolus 500 mL (has no administration in time range)  acetaminophen (TYLENOL) tablet 650 mg (650 mg Oral Given 12/09/19 2028)    ED Course  I have reviewed the triage vital signs and the nursing notes.  Pertinent labs & imaging results that were available during my care of the patient were reviewed by me and  considered in my medical decision making (see chart for details).  Clinical Course as of Dec 08 2352  Thu Dec 09, 2019  1819 Temp(!): 100.9 F (38.3 C) [MV]  2228 SARS Coronavirus 2(!): POSITIVE [MV]    Clinical Course User  Index [MV] Eustaquio Maize, PA-C   MDM Rules/Calculators/A&P                          86 year old female who presents to the ED today for generalized weakness with 2 recent falls, landed on left shoulder yesterday with head injury, patient is anticoagulated on Plavix.  Arrival to the ED patient is febrile 100.9.  Nontachycardic and nontachypneic.  She appears to be in no acute distress.  Blood pressure is elevated 201/68. She has tenderness to palpation to her left shoulder, no other findings on exam.  No focal neuro deficits.  She is alert and oriented x4.  Head injury yesterday will obtain CT head.  We will plan for x-ray of the shoulder and humerus on the left side.  Given fever, Tylenol has been ordered.  Will obtain chest x-ray to rule out infection, urinalysis, Covid test.  Patient has been vaccinated.  Troponin has been ordered due to generalized weakness, history of stenting in the past.  Patient denies any chest pain.   CT head clear DG shoulder with possible scapular head fx. Have added on scapular xray  CBC without leukocytosis. Hgb stable 12.8.  CMP with stable creatinine 1.69. Glucose 214. Bicarb within normal limits. No gap.  Troponin 84; will repeat. EKG without acute ischemic changes.   Scapula xray negative.  COVID test has returned POSITIVE. Pt without any respiratory complaints at this time. Suspect her generalized fatigue is related to this. Will plan for repeat trop at this time and dispo accordingly. I do feel pt can be discharged home at this time. Daughter's main concern is how she is going to get patient home up the 4 steps into the house tonight however does feel comfortable taking care of patient in the house itself - I do feel pt may benefit from homehealth and will order as well. Nursing staff to ambulate patient.   Nursing staff ambulated to the restroom - pt required 2 person assistance.   At shift change case signed out to [facility], PA-C, who will dispo  patient accordingly after repeat trop. If no significant change will dispo home. HH has been ordered. U/A still pending as well. MAB clinic made aware of patient via secure message and should reach out to patient for outpatient MAB. Do not feel she requires MAB emergently in the ER today.   This note was prepared using Dragon voice recognition software and may include unintentional dictation errors due to the inherent limitations of voice recognition software.  Final Clinical Impression(s) / ED Diagnoses Final diagnoses:  COVID-19  Weakness    Rx / DC Orders ED Discharge Orders         [place]  Reprint     12/09/19 2335    Face-to-face encounter (required for Medicare/Medicaid patients)     Discontinue  Reprint    Comments: I Eustaquio Maize certify that this patient is under my care and that I, or a nurse practitioner or physician's assistant working with me, had a face-to-face encounter that meets the physician face-to-face encounter requirements with this patient on 12/09/2019. The encounter with the patient was in  whole, or in part for the following medical condition(s) which is the primary reason for home health care (List medical condition): generalized weakness/difficulty ambulating   12/09/19 2335           Eustaquio Maize, PA-C 12/09/19 2355    Wyvonnia Dusky, MD 12/10/19 [phone]

## 2019-12-09 NOTE — ED Notes (Signed)
Unable to obtain repeat trop second phlebomist to attempt.

## 2019-12-09 NOTE — ED Triage Notes (Signed)
Pt has had increased falls with transitioning. Last fall was last night and landed on her left shoulder. Unknown if hit head. Per EMS family member states she has been lethargic and slow to response. Per EMS A&Ox4. Pt on Plavix. No obvious deformity. No complaint from pt.

## 2019-12-09 NOTE — ED Provider Notes (Signed)
23:45: Assumed plan of care from [facility] at change of shift pending delta troponin & UA. Plan if no significant elevation in troponin to discharge home.   Please see prior provider note for full H&P.  Briefly patient is an 86 yo female with a hx of CAD S/p stents, CKD, DM, HTN, HLD who presented to the ED with complaints of generalized weakness. Found to be febrile & covid 19 positive.   Physical Exam  BP (!) 148/61   Pulse 60   Temp 98.2 F (36.8 C) (Oral)   Resp 16   Ht 5\' 7"  (1.702 m)   Wt 68 kg   SpO2 98%   BMI 23.49 kg/m   Physical Exam Vitals and nursing note reviewed.  Constitutional:      General: She is not in acute distress.    Appearance: She is well-developed.  HENT:     Head: Normocephalic and atraumatic.  Eyes:     General:        Right eye: No discharge.        Left eye: No discharge.     Conjunctiva/sclera: Conjunctivae normal.  Pulmonary:     Effort: Pulmonary effort is normal. No respiratory distress.  Neurological:     Mental Status: She is alert.     Comments: Clear speech.   Psychiatric:        Behavior: Behavior normal.        Thought Content: Thought content normal.    ED Course/Procedures   Results for orders placed or performed during the hospital encounter of 12/09/19  SARS Coronavirus 2 by RT PCR (hospital order, performed in [facility] hospital lab) Nasopharyngeal Nasopharyngeal Swab   Specimen: Nasopharyngeal Swab  Result Value Ref Range   SARS Coronavirus 2 POSITIVE (A) NEGATIVE  Comprehensive metabolic panel  Result Value Ref Range   Sodium 135 135 - 145 mmol/L   Potassium 4.5 3.5 - 5.1 mmol/L   Chloride 104 98 - 111 mmol/L   CO2 22 22 - 32 mmol/L   Glucose, Bld 214 (H) 70 - 99 mg/dL   BUN 29 (H) 8 - 23 mg/dL   Creatinine, Ser [facility] (H) 0.44 - 1.00 mg/dL   Calcium 8.7 (L) 8.9 - 10.3 mg/dL   Total Protein 6.3 (L) 6.5 - 8.1 g/dL   Albumin 3.3 (L) 3.5 - 5.0 g/dL   AST 52 (H) 15 - 41 U/L   ALT 30 0 - 44 U/L   Alkaline  Phosphatase 46 38 - 126 U/L   Total Bilirubin 0.6 0.3 - 1.2 mg/dL   GFR calc non Af Amer 27 (L) >60 mL/min   GFR calc Af Amer 32 (L) >60 mL/min   Anion gap 9 5 - 15  CBC with Differential  Result Value Ref Range   WBC 5.1 4.0 - 10.5 K/uL   RBC 4.38 3.87 - 5.11 MIL/uL   Hemoglobin 12.8 12.0 - 15.0 g/dL   HCT 1.61 36 - 46 %   MCV 91.3 80.0 - 100.0 fL   MCH 29.2 26.0 - 34.0 pg   MCHC 32.0 30.0 - 36.0 g/dL   RDW 09.6 04.5 - 40.9 %   Platelets 191 150 - 400 K/uL   nRBC 0.0 0.0 - 0.2 %   Neutrophils Relative % 47 %   Neutro Abs 2.4 1.7 - 7.7 K/uL   Lymphocytes Relative 31 %   Lymphs Abs 1.6 0.7 - 4.0 K/uL   Monocytes Relative 21 %   Monocytes Absolute  1.0 0 - 1 K/uL   Eosinophils Relative 0 %   Eosinophils Absolute 0.0 0 - 0 K/uL   Basophils Relative 1 %   Basophils Absolute 0.0 0 - 0 K/uL   Immature Granulocytes 0 %   Abs Immature Granulocytes 0.01 0.00 - 0.07 K/uL  Urinalysis, Routine w reflex microscopic Urine, Clean Catch  Result Value Ref Range   Color, Urine YELLOW YELLOW   APPearance HAZY (A) CLEAR   Specific Gravity, Urine 1.013 1.005 - 1.030   pH 5.0 5.0 - 8.0   Glucose, UA >=500 (A) NEGATIVE mg/dL   Hgb urine dipstick MODERATE (A) NEGATIVE   Bilirubin Urine NEGATIVE NEGATIVE   Ketones, ur NEGATIVE NEGATIVE mg/dL   Protein, ur NEGATIVE NEGATIVE mg/dL   Nitrite NEGATIVE NEGATIVE   Leukocytes,Ua MODERATE (A) NEGATIVE   RBC / HPF 0-5 0 - 5 RBC/hpf   WBC, UA 6-10 0 - 5 WBC/hpf   Bacteria, UA RARE (A) NONE SEEN   Squamous Epithelial / LPF 0-5 0 - 5  POC CBG, ED  Result Value Ref Range   Glucose-Capillary 177 (H) 70 - 99 mg/dL  Troponin I (High Sensitivity)  Result Value Ref Range   Troponin I (High Sensitivity) 84 (H) <18 ng/L  Troponin I (High Sensitivity)  Result Value Ref Range   Troponin I (High Sensitivity) 93 (H) <18 ng/L   DG Chest 2 View  Result Date: 12/09/2019 CLINICAL DATA:  Shoulder pain after fall EXAM: CHEST - 2 VIEW COMPARISON:  12/12/2013  FINDINGS: No focal opacity or pleural effusion. Cardiomediastinal silhouette within normal limits. No pneumothorax. IMPRESSION: No active cardiopulmonary disease. Electronically Signed   By: Jasmine Pang M.D.   On: 12/09/2019 19:11   DG Scapula Left  Result Date: 12/09/2019 CLINICAL DATA:  86 year old female with concern for fracture of the left scapular neck. EXAM: LEFT SCAPULA - 2+ VIEWS COMPARISON:  Left shoulder radiograph dated 12/09/2019. FINDINGS: No acute fracture or dislocation. Focal indentation of the posterior wing of the scapula, likely chronic. The bones are osteopenic. The soft tissues are unremarkable. IMPRESSION: No acute fracture or dislocation. Electronically Signed   By: Elgie Collard M.D.   On: 12/09/2019 20:06   CT Head Wo Contrast  Result Date: 12/09/2019 CLINICAL DATA:  Fall, on blood thinners. EXAM: CT HEAD WITHOUT CONTRAST TECHNIQUE: Contiguous axial images were obtained from the base of the skull through the vertex without intravenous contrast. COMPARISON:  CT head dated 03/11/2012. FINDINGS: Brain: Generalized age related parenchymal volume loss with commensurate dilatation of the ventricles and sulci. Chronic small vessel ischemic changes within the bilateral periventricular white matter regions. No mass, hemorrhage, edema or other evidence of acute parenchymal abnormality. No extra-axial hemorrhage. Vascular: Chronic calcified atherosclerotic changes of the large vessels at the skull base. No unexpected hyperdense vessel. Skull: Normal. Negative for fracture or focal lesion. Sinuses/Orbits: No acute finding. Other: None. IMPRESSION: 1. No acute findings. No intracranial mass, hemorrhage or edema. No skull fracture. 2. Chronic small vessel ischemic changes in the white matter. Electronically Signed   By: Bary Richard M.D.   On: 12/09/2019 18:39   DG Shoulder Left  Result Date: 12/09/2019 CLINICAL DATA:  Shoulder pain after fall EXAM: LEFT SHOULDER - 2+ VIEW COMPARISON:   None. FINDINGS: Mild irregular lucency at the scapular neck. AC joint is intact. No fracture or malalignment at the glenohumeral interval. IMPRESSION: Mild irregular lucency at the scapular neck is indeterminate for nondisplaced fracture. Electronically Signed   By: Jasmine Pang  M.D.   On: 12/09/2019 19:10   DG Humerus Left  Result Date: 12/09/2019 CLINICAL DATA:  Shoulder pain after fall EXAM: LEFT HUMERUS - 2+ VIEW COMPARISON:  None. FINDINGS: There is no evidence of fracture or other focal bone lesions. Soft tissues are unremarkable. Intravenous catheter at the at the antecubital fossa IMPRESSION: No acute osseous abnormality Electronically Signed   By: Jasmine PangKim  Fujinaga M.D.   On: 12/09/2019 19:12   EKG Interpretation  Date/Time:  Thursday December 09 2019 18:57:41 EDT Ventricular Rate:  66 PR Interval:  152 QRS Duration: 84 QT Interval:  414 QTC Calculation: 434 R Axis:   13 Text Interpretation: Normal sinus rhythm Low voltage QRS Cannot rule out Anterior infarct , age undetermined Abnormal ECG No STEMI Confirmed by Alvester Chourifan, Matthew [id number]) on 12/09/2019 7:11:35 PM   Clinical Course as of Dec 09 2346  Thu Dec 09, 2019  1819 Temp(!): 100.9 F (38.3 C) [MV]  2228 SARS Coronavirus 2(!): POSITIVE [MV]    Clinical Course User Index [MV] Tanda RockersVenter, Margaux, PA-C    Procedures  MDM  COVID: Positive CBC: Unremarkable CMP: Renal function similar to prior. No significant electrolyte derangement.  UA: Moderate leukocytes present, no urinary sxs Troponins: 84, 93 EKG: No STEMI Imaging grossly unremarkable- dedicated L scapular xray w/o fx.   Urinalysis with moderate leukocytes, however patient does not have any urinary symptoms, given her age will send for culture.  Her troponin remains elevated and is slightly uptrending from 84 to 93, patient has no chest pain, dyspnea, nausea, vomiting, or diaphoresis, she states this does not feel similar to her prior heart issues requiring stents, I have a  low suspicion for ACS at this time, suspect this is more demand with her COVID-19 diagnosis.  She is not hypoxic or tachycardic to raise concern for pulmonary embolism at this time.  However given patient's age, risk factors, and mildly uptrending troponin I discussed the option of admission for observation with her and her daughter, we discussed the risks and benefits, ultimately patient very adamant about wanting to go home.  Given patient is not hypoxic and her daughter is able to care for her I feel this is reasonable.  We additionally discussed the option of monoclonal antibody infusion in the emergency department, she did not wish to stay for this at this time, she would prefer to follow-up with the clinic- her information has been sent by prior team, prior team has also placed order for home health as well.  She remains without signs of respiratory distress.  Will discharge home per discharge instructions by prior provider. I discussed results, treatment plan, need for follow-up, and return precautions with the patient & her daughter. Provided opportunity for questions, patient & her daughter confirmed understanding and are in agreement with plan.    Michaela ShirkMildred S Hammond was evaluated in Emergency Department on 12/10/2019 for the symptoms described in the history of present illness. He/she was evaluated in the context of the global COVID-19 pandemic, which necessitated consideration that the patient might be at risk for infection with the SARS-CoV-2 virus that causes COVID-19. Institutional protocols and algorithms that pertain to the evaluation of patients at risk for COVID-19 are in a state of rapid change based on information released by regulatory bodies including the CDC and federal and state organizations. These policies and algorithms were followed during the patient's care in the ED.       Desmond Lopeetrucelli, Laron Angelini R, PA-C 12/10/19 [id number]    Long, Arlyss RepressJoshua G,  MD 12/10/19 1709

## 2019-12-10 ENCOUNTER — Telehealth: Payer: Self-pay | Admitting: Nurse Practitioner

## 2019-12-10 LAB — TROPONIN I (HIGH SENSITIVITY): Troponin I (High Sensitivity): 93 ng/L — ABNORMAL HIGH (ref <18)

## 2019-12-10 NOTE — ED Notes (Signed)
[facility] called 

## 2019-12-10 NOTE — ED Notes (Signed)
3rd on ptar list  

## 2019-12-10 NOTE — Telephone Encounter (Signed)
Called to discuss with [name] about Covid symptoms and the use of casirivimab/imdevimab, a combination monoclonal antibody infusion for those with mild to moderate Covid symptoms and at a high risk of hospitalization.     Pt is qualified for this infusion at the [facility] infusion center due to co-morbid conditions and/or a member of an at-risk group, however declines infusion at this time. Husband reports patient does not have many symptoms related to Covid. States most of her current complications are related to chronic co-morbidities with difficulty determining if her known virus has any affect on her current condition.   Symptoms tier reviewed as well as criteria for ending isolation.  Symptoms reviewed that would warrant ED/Hospital evaluation. Preventative practices reviewed. Patient verbalized understanding. Patient advised to call back if he decides that he does want to get infusion. Callback number to the infusion center given. Patient advised to go to Urgent care or ED with severe symptoms. Last date she would be eligible for infusion is 12/17/19   Patient Active Problem List   Diagnosis Date Noted  . Unstable angina (HCC) 12/14/2013  . Chest pain 12/12/2013  . Viral URI with cough 05/06/2013  . Dermatitis 11/23/2012  . Headache(784.0) 03/14/2012  . VITAMIN B12 DEFICIENCY 05/12/2009  . NEUROPATHY 05/03/2009  . ANEMIA 03/31/2009  . GERD 03/31/2009  . EDEMA 03/31/2009  . RENAL DISEASE, CHRONIC, MILD 01/06/2008  . ANEMIA, IRON DEFICIENCY 08/31/2007  . Pain in limb 04/20/2007  . DIABETES MELLITUS, TYPE II 11/21/2006  . HYPERLIPIDEMIA 11/21/2006  . HYPERTENSION 11/21/2006  . CORONARY ARTERY DISEASE 11/21/2006  . DIVERTICULOSIS, COLON 11/21/2006  . OSTEOPENIA 11/21/2006    Willette Alma, AGPCNP-BC

## 2019-12-10 NOTE — ED Notes (Signed)
Called [facility] to advise patients family will be taking her home--[name]

## 2019-12-10 NOTE — ED Notes (Signed)
Pt family member took patient home via POV.

## 2019-12-11 LAB — URINE CULTURE

## 2019-12-12 ENCOUNTER — Other Ambulatory Visit: Payer: Self-pay

## 2019-12-12 ENCOUNTER — Encounter (HOSPITAL_COMMUNITY): Payer: Self-pay | Admitting: *Deleted

## 2019-12-12 ENCOUNTER — Emergency Department (HOSPITAL_COMMUNITY): Payer: Medicare Other

## 2019-12-12 ENCOUNTER — Inpatient Hospital Stay (HOSPITAL_COMMUNITY)
Admission: EM | Admit: 2019-12-12 | Discharge: 2019-12-14 | DRG: 178 | Disposition: A | Discharge status: Home / Self Care | Payer: Medicare Other | Attending: Internal Medicine | Admitting: Internal Medicine

## 2019-12-12 DIAGNOSIS — U071 COVID-19: Principal | ICD-10-CM

## 2019-12-12 DIAGNOSIS — E1169 Type 2 diabetes mellitus with other specified complication: Secondary | ICD-10-CM | POA: Diagnosis present

## 2019-12-12 DIAGNOSIS — I959 Hypotension, unspecified: Secondary | ICD-10-CM | POA: Diagnosis present

## 2019-12-12 DIAGNOSIS — Z7902 Long term (current) use of antithrombotics/antiplatelets: Secondary | ICD-10-CM

## 2019-12-12 DIAGNOSIS — I2510 Atherosclerotic heart disease of native coronary artery without angina pectoris: Secondary | ICD-10-CM | POA: Diagnosis present

## 2019-12-12 DIAGNOSIS — N1832 Chronic kidney disease, stage 3b: Secondary | ICD-10-CM | POA: Diagnosis present

## 2019-12-12 DIAGNOSIS — Z87440 Personal history of urinary (tract) infections: Secondary | ICD-10-CM | POA: Diagnosis not present

## 2019-12-12 DIAGNOSIS — Z794 Long term (current) use of insulin: Secondary | ICD-10-CM

## 2019-12-12 DIAGNOSIS — E875 Hyperkalemia: Secondary | ICD-10-CM | POA: Diagnosis present

## 2019-12-12 DIAGNOSIS — M8580 Other specified disorders of bone density and structure, unspecified site: Secondary | ICD-10-CM | POA: Diagnosis present

## 2019-12-12 DIAGNOSIS — Z882 Allergy status to sulfonamides status: Secondary | ICD-10-CM | POA: Diagnosis not present

## 2019-12-12 DIAGNOSIS — R531 Weakness: Secondary | ICD-10-CM | POA: Diagnosis present

## 2019-12-12 DIAGNOSIS — W19XXXA Unspecified fall, initial encounter: Secondary | ICD-10-CM | POA: Diagnosis present

## 2019-12-12 DIAGNOSIS — E1142 Type 2 diabetes mellitus with diabetic polyneuropathy: Secondary | ICD-10-CM | POA: Diagnosis present

## 2019-12-12 DIAGNOSIS — R159 Full incontinence of feces: Secondary | ICD-10-CM | POA: Diagnosis not present

## 2019-12-12 DIAGNOSIS — I25110 Atherosclerotic heart disease of native coronary artery with unstable angina pectoris: Secondary | ICD-10-CM | POA: Diagnosis present

## 2019-12-12 DIAGNOSIS — Z888 Allergy status to other drugs, medicaments and biological substances status: Secondary | ICD-10-CM

## 2019-12-12 DIAGNOSIS — L309 Dermatitis, unspecified: Secondary | ICD-10-CM | POA: Diagnosis present

## 2019-12-12 DIAGNOSIS — I4891 Unspecified atrial fibrillation: Secondary | ICD-10-CM | POA: Diagnosis present

## 2019-12-12 DIAGNOSIS — I129 Hypertensive chronic kidney disease with stage 1 through stage 4 chronic kidney disease, or unspecified chronic kidney disease: Secondary | ICD-10-CM | POA: Diagnosis present

## 2019-12-12 DIAGNOSIS — E785 Hyperlipidemia, unspecified: Secondary | ICD-10-CM | POA: Diagnosis present

## 2019-12-12 DIAGNOSIS — R197 Diarrhea, unspecified: Secondary | ICD-10-CM | POA: Diagnosis not present

## 2019-12-12 DIAGNOSIS — Z8249 Family history of ischemic heart disease and other diseases of the circulatory system: Secondary | ICD-10-CM | POA: Diagnosis not present

## 2019-12-12 DIAGNOSIS — Z79899 Other long term (current) drug therapy: Secondary | ICD-10-CM

## 2019-12-12 DIAGNOSIS — Z955 Presence of coronary angioplasty implant and graft: Secondary | ICD-10-CM | POA: Diagnosis not present

## 2019-12-12 DIAGNOSIS — N179 Acute kidney failure, unspecified: Secondary | ICD-10-CM | POA: Diagnosis present

## 2019-12-12 DIAGNOSIS — E1122 Type 2 diabetes mellitus with diabetic chronic kidney disease: Secondary | ICD-10-CM | POA: Diagnosis present

## 2019-12-12 DIAGNOSIS — K219 Gastro-esophageal reflux disease without esophagitis: Secondary | ICD-10-CM | POA: Diagnosis present

## 2019-12-12 DIAGNOSIS — B372 Candidiasis of skin and nail: Secondary | ICD-10-CM | POA: Diagnosis present

## 2019-12-12 DIAGNOSIS — Z9049 Acquired absence of other specified parts of digestive tract: Secondary | ICD-10-CM

## 2019-12-12 DIAGNOSIS — R296 Repeated falls: Secondary | ICD-10-CM | POA: Diagnosis present

## 2019-12-12 LAB — CBC WITH DIFFERENTIAL/PLATELET
Abs Immature Granulocytes: 0.02 10*3/uL (ref 0.00–0.07)
Basophils Absolute: 0 10*3/uL (ref 0.0–0.1)
Basophils Relative: 0 %
Eosinophils Absolute: 0 10*3/uL (ref 0.0–0.5)
Eosinophils Relative: 0 %
HCT: 39.4 % (ref 36.0–46.0)
Hemoglobin: 12.6 g/dL (ref 12.0–15.0)
Immature Granulocytes: 1 %
Lymphocytes Relative: 39 %
Lymphs Abs: 1.2 10*3/uL (ref 0.7–4.0)
MCH: 29.5 pg (ref 26.0–34.0)
MCHC: 32 g/dL (ref 30.0–36.0)
MCV: 92.3 fL (ref 80.0–100.0)
Monocytes Absolute: 0.5 10*3/uL (ref 0.1–1.0)
Monocytes Relative: 16 %
Neutro Abs: 1.3 10*3/uL — ABNORMAL LOW (ref 1.7–7.7)
Neutrophils Relative %: 44 %
Platelets: 166 10*3/uL (ref 150–400)
RBC: 4.27 MIL/uL (ref 3.87–5.11)
RDW: 12.4 % (ref 11.5–15.5)
WBC: 3 10*3/uL — ABNORMAL LOW (ref 4.0–10.5)
nRBC: 0 % (ref 0.0–0.2)

## 2019-12-12 LAB — TRIGLYCERIDES: Triglycerides: 135 mg/dL (ref <150)

## 2019-12-12 LAB — COMPREHENSIVE METABOLIC PANEL
ALT: 25 U/L (ref 0–44)
AST: 68 U/L — ABNORMAL HIGH (ref 15–41)
Albumin: 2.8 g/dL — ABNORMAL LOW (ref 3.5–5.0)
Alkaline Phosphatase: 38 U/L (ref 38–126)
Anion gap: 12 (ref 5–15)
BUN: 27 mg/dL — ABNORMAL HIGH (ref 8–23)
CO2: 17 mmol/L — ABNORMAL LOW (ref 22–32)
Calcium: 7.8 mg/dL — ABNORMAL LOW (ref 8.9–10.3)
Chloride: 104 mmol/L (ref 98–111)
Creatinine, Ser: 1.72 mg/dL — ABNORMAL HIGH (ref 0.44–1.00)
GFR calc Af Amer: 31 mL/min — ABNORMAL LOW (ref >60)
GFR calc non Af Amer: 27 mL/min — ABNORMAL LOW (ref >60)
Glucose, Bld: 171 mg/dL — ABNORMAL HIGH (ref 70–99)
Potassium: 5.7 mmol/L — ABNORMAL HIGH (ref 3.5–5.1)
Sodium: 133 mmol/L — ABNORMAL LOW (ref 135–145)
Total Bilirubin: 1.7 mg/dL — ABNORMAL HIGH (ref 0.3–1.2)
Total Protein: 5.7 g/dL — ABNORMAL LOW (ref 6.5–8.1)

## 2019-12-12 LAB — C-REACTIVE PROTEIN: CRP: 4.1 mg/dL — ABNORMAL HIGH (ref <1.0)

## 2019-12-12 LAB — TSH: TSH: 0.501 u[IU]/mL (ref 0.350–4.500)

## 2019-12-12 LAB — FIBRINOGEN: Fibrinogen: 510 mg/dL — ABNORMAL HIGH (ref 210–475)

## 2019-12-12 LAB — PROCALCITONIN: Procalcitonin: <0.1 ng/mL

## 2019-12-12 LAB — D-DIMER, QUANTITATIVE: D-Dimer, Quant: 1.06 ug/mL-FEU — ABNORMAL HIGH (ref 0.00–0.50)

## 2019-12-12 LAB — CBG MONITORING, ED: Glucose-Capillary: 185 mg/dL — ABNORMAL HIGH (ref 70–99)

## 2019-12-12 LAB — LACTIC ACID, PLASMA
Lactic Acid, Venous: 1.4 mmol/L (ref 0.5–1.9)
Lactic Acid, Venous: 1.4 mmol/L (ref 0.5–1.9)

## 2019-12-12 LAB — LACTATE DEHYDROGENASE: LDH: 394 U/L — ABNORMAL HIGH (ref 98–192)

## 2019-12-12 LAB — FERRITIN: Ferritin: 214 ng/mL (ref 11–307)

## 2019-12-12 MED ORDER — LINAGLIPTIN 5 MG PO TABS
5.0000 mg | ORAL_TABLET | Freq: Every day | ORAL | Status: DC
  Administered 2019-12-12 – 2019-12-14 (×3): 5 mg via ORAL
  Filled 2019-12-12 (×3): qty 1

## 2019-12-12 MED ORDER — METOPROLOL TARTRATE 25 MG PO TABS
25.0000 mg | ORAL_TABLET | Freq: Two times a day (BID) | ORAL | Status: DC
  Filled 2019-12-12: qty 1

## 2019-12-12 MED ORDER — METOPROLOL TARTRATE 25 MG PO TABS
25.0000 mg | ORAL_TABLET | Freq: Four times a day (QID) | ORAL | Status: DC
  Administered 2019-12-12 – 2019-12-14 (×6): 25 mg via ORAL
  Filled 2019-12-12 (×7): qty 1

## 2019-12-12 MED ORDER — ONDANSETRON HCL 4 MG PO TABS: 4.0000 mg | ORAL_TABLET | Freq: Four times a day (QID) | ORAL | Status: DC | PRN

## 2019-12-12 MED ORDER — SODIUM CHLORIDE 0.9 % IV SOLN
200.0000 mg | Freq: Once | INTRAVENOUS | Status: AC
  Administered 2019-12-12: 200 mg via INTRAVENOUS
  Filled 2019-12-12: qty 40

## 2019-12-12 MED ORDER — SODIUM CHLORIDE 0.9 % IV SOLN
100.0000 mg | Freq: Every day | INTRAVENOUS | Status: DC
  Administered 2019-12-13 – 2019-12-14 (×2): 100 mg via INTRAVENOUS
  Filled 2019-12-12 (×2): qty 20

## 2019-12-12 MED ORDER — SENNOSIDES-DOCUSATE SODIUM 8.6-50 MG PO TABS
1.0000 | ORAL_TABLET | Freq: Every evening | ORAL | Status: DC | PRN
  Filled 2019-12-12: qty 1

## 2019-12-12 MED ORDER — ACETAMINOPHEN 325 MG PO TABS
650.0000 mg | ORAL_TABLET | Freq: Four times a day (QID) | ORAL | Status: DC | PRN
  Administered 2019-12-12 – 2019-12-14 (×2): 650 mg via ORAL
  Filled 2019-12-12 (×2): qty 2

## 2019-12-12 MED ORDER — ONDANSETRON HCL 4 MG/2ML IJ SOLN
4.0000 mg | Freq: Four times a day (QID) | INTRAMUSCULAR | Status: DC | PRN
  Administered 2019-12-12: 4 mg via INTRAVENOUS
  Filled 2019-12-12: qty 2

## 2019-12-12 MED ORDER — INSULIN ASPART 100 UNIT/ML ~~LOC~~ SOLN
0.0000 [IU] | Freq: Three times a day (TID) | SUBCUTANEOUS | Status: DC
  Administered 2019-12-12: 3 [IU] via SUBCUTANEOUS
  Administered 2019-12-13: 1 [IU] via SUBCUTANEOUS

## 2019-12-12 MED ORDER — CLOPIDOGREL BISULFATE 75 MG PO TABS
75.0000 mg | ORAL_TABLET | Freq: Every day | ORAL | Status: DC
  Administered 2019-12-12 – 2019-12-14 (×3): 75 mg via ORAL
  Filled 2019-12-12 (×3): qty 1

## 2019-12-12 MED ORDER — SODIUM CHLORIDE 0.9 % IV BOLUS (SEPSIS)
500.0000 mL | Freq: Once | INTRAVENOUS | Status: AC
  Administered 2019-12-12: 500 mL via INTRAVENOUS

## 2019-12-12 MED ORDER — FENOFIBRATE 54 MG PO TABS
54.0000 mg | ORAL_TABLET | Freq: Every day | ORAL | Status: DC
  Administered 2019-12-12 – 2019-12-14 (×3): 54 mg via ORAL
  Filled 2019-12-12 (×4): qty 1

## 2019-12-12 MED ORDER — APIXABAN 2.5 MG PO TABS
2.5000 mg | ORAL_TABLET | Freq: Two times a day (BID) | ORAL | Status: DC
  Administered 2019-12-12 – 2019-12-14 (×4): 2.5 mg via ORAL
  Filled 2019-12-12 (×5): qty 1

## 2019-12-12 MED ORDER — LACTATED RINGERS IV BOLUS
500.0000 mL | Freq: Once | INTRAVENOUS | Status: AC
  Administered 2019-12-12: 500 mL via INTRAVENOUS

## 2019-12-12 MED ORDER — SODIUM ZIRCONIUM CYCLOSILICATE 5 G PO PACK
5.0000 g | PACK | Freq: Once | ORAL | Status: AC
  Administered 2019-12-12: 5 g via ORAL
  Filled 2019-12-12 (×2): qty 1

## 2019-12-12 MED ORDER — INSULIN GLARGINE 100 UNIT/ML ~~LOC~~ SOLN
10.0000 [IU] | Freq: Every day | SUBCUTANEOUS | Status: DC
  Administered 2019-12-12: 10 [IU] via SUBCUTANEOUS
  Filled 2019-12-12 (×3): qty 0.1

## 2019-12-12 MED ORDER — PRAVASTATIN SODIUM 40 MG PO TABS
40.0000 mg | ORAL_TABLET | Freq: Every day | ORAL | Status: DC
  Administered 2019-12-12 – 2019-12-13 (×2): 40 mg via ORAL
  Filled 2019-12-12 (×2): qty 1

## 2019-12-12 MED ORDER — APIXABAN 5 MG PO TABS: 5.0000 mg | ORAL_TABLET | Freq: Two times a day (BID) | ORAL | Status: DC

## 2019-12-12 NOTE — H&P (Signed)
Date: 12/12/2019               Patient Name:  [name] MRN: [id number]  DOB: Aug 28, 1933 Age / Sex: 86 y.o., female   PCP: Michaela Quill, PA-C         Medical Service: Internal Medicine Teaching Service         Attending Physician: Dr. Earl Lagos, MD    First Contact: Dr. Marijo Conception Pager: [phone]  Second Contact: Dr. Nedra Hai Pager: [phone]       After Hours (After 5p/  First Contact Pager: [phone]  weekends / holidays): Second Contact Pager: [phone]   Chief Complaint: Weakness  History of Present Illness:  [name] is an 86 yo F w/ PMH of CAD s/p DESx2, HTN, HLD and DM presenting with weakness. She was in her usual state of health until early last week when her husband began to endorse symptoms of fatigue and cough. Shortly after many of her family members became sick with COVID and she began to have weakness, malaise and nausea with loss of appetite. After not having much to eat for couple days prior, she had a fall on 12/08/19 evening while using her walker and landed on her shoulder. At that point she was brought by EMS to [facility] and was found to be COVID positive. She was discharged home at that time with plan to get home health and treat with supportive care. She was also planned for outpatient infusion for her symptoms. However earlier this morning, she was found by her family members to have worsening mentation with light-headedness and EMS was called again. She was found to be in A.Fib with RVR and was brought to [facility] for re-evaluation. She mentions that she has had COVID vaccine but is unable to recall the brand.   On review of systems, she denies any dyspnea, chest pain, palpitations. Complaints of nausea, loss of appetite, weakness, light-headedness, fatigue and chills.  Meds:  Diprolene-AF cream qhs Calcium Carbonate 500mg  PRN Carvedilol 6.25mg  BID Clopidogrel 75mg  daily Fenofibrate 54mg  daily Glipizide 10mg  daily Lantus 20 units qhs Lovastatin 40mg   daily Nitroglycerin 0.4mg  sl PRN Permethrin 5% cream qhs prn  Allergies: Allergies as of 12/12/2019 - Review Complete 12/12/2019  Allergen Reaction Noted  . Corticosteroids Other (See Comments) 07/27/2019  . Sulfonamide derivatives Hives   . Pioglitazone Other (See Comments)    Past Medical History:  Diagnosis Date  . Anemia   . Anemia, iron deficiency   . CAD (coronary artery disease)    status Taxus drug-eluding stent to the LAD December 2006  . Chronic renal disease    mild  . Dermatitis 11/23/2012  . Diabetes mellitus type II   . Diverticulosis of colon   . Edema    lower extremity with normal BNP  . Encounter for long-term (current) use of medications   . GERD (gastroesophageal reflux disease)   . Glossitis   . Hyperkalemia   . Hyperlipidemia   . Hypertension   . Leg pain, left   . Osteopenia   . Pain in foot    bilateral  . Polyneuropathy   . Pyuria   . Routine general medical examination at a health care facility   . UTI (urinary tract infection)    Family History:  Mentions her head feels too foggy and is unable to recall but chart review shows heart disease in mother and sister  Social History:  Lives with husband. Has 3 children and 5 grandchildren. 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  Review of Systems: A complete ROS was negative except as per HPI.  Physical Exam: Blood pressure 112/67, pulse (!) 43, temperature 99.5 F (37.5 C), temperature source Rectal, resp. rate (!) 25, height 5\' 7"  (1.702 m), weight 68 kg, SpO2 98 %.  Gen: Well-developed, ill-appearing HEENT: NCAT head, poor-hearing, EOMI, MMM Neck: supple, ROM intact, no JVD, no cervical adenopathy CV: Irregularly irregular, no discernible murmur Pulm: Bilateral crackles, no wheeze Extm: ROM intact, Peripheral pulses intact, No peripheral edema Skin: Dry, Warm, poor skin turgor Neuro: AAOx3 Psych: Normal mood and affect  EKG: personally reviewed my  interpretation is atrial fibrillation with rapid ventricular rate, no ST changes, no prior A.fib on previous EKGs.   CXR: personally reviewed my interpretation is no lobar consolidation, no pulmonary edema, no pleural effusions  Assessment & Plan by Problem: Active Problems:   Atrial fibrillation with rapid ventricular response (HCC)  Ms.Foley is an 86 yo F w/ PMH of CAD s/p DESx2, HTN, HLD and DM admit for A.Fib w/ RVR likely 2/2 COVID infection  New Atrial Fibrillation with Rapid Ventricular Response No prior hx of A.fib. No atrial structural abnormalities on most recent Echo (04/2018). Likely driven by COVID infection and profound dehydration. On carvedilol at home. Not started on dilt gtt due to hypotension but bp and hr already improving with fluids. HR down to 110s. Bp up to 131/80. Likely to achieve rate control w/ PO meds. CHADSVasc2 score of 5 due to age/gender/MI/DM - Additional LR 500cc bolus - Check TSH - Telemetry - Start metoprolol 25mg  q6hr - Start eliquis 5mg  BID  COVID infection Found to be COVID +. Inflammatory markers elevated (CRP 4.1, D-dimer 1.06). Mentions getting 1 time COVID vaccine but unable to recall name [facility] & ?). No oxygen requirement. Chest-Xray w/o significant opacities although radiology read describe possible LML density - F/u procalcitonin - No indication for steroids at this time (also would be worse for A.fib) - Start remdesivir as hospitalized patient (day 1/5) - Trend LFTS, inflammatory markers - C/w isolations  Hyperkalemia Acute Kidney Injury on Chronic Kidney Disease stage 3b Baseline Bun 25, Creatinine 1.42. Appear dry on exam. Creatinine elevated at 1.72. K 5.7 Unable to interpret hyperkalemic changes on EKG due to A.fib but sample may be hemolyzed per result note. Started on [facility] in ED. - C/w lokelma - Trend renal fx - Avoid nephrotoxic meds  T2DM On lantus 20 units qhs, glipizide at home. CBG 171 on admission. - Start  linagliptin per COVID treatment protocol for DM patients - Start lantus 10 units qhs - SSI - Glucose checks  CAD s/p DESx2 Hx of DES in LAD & circumflex. EF remains wnl after MI. Denies any chest pain at the moment. Troponins drawn at recent visit flat. - C/w home meds: clopidogrel 75mg  daily, lovastatin 40mg  daily  DVT prophx: eliquis Diet: CM/HH Bowel: Senokot Code: Full  Prior to Admission Living Arrangement: Home Anticipated Discharge Location: Home Barriers to Discharge: Medical treatment  Dispo: Admit patient to Inpatient with expected length of stay greater than 2 midnights.  Signed: [facility], MD 12/12/2019, 5:24 PM Pager: [phone] After 5pm on weekdays and 1pm on weekends: On Call Pager: [phone]

## 2019-12-12 NOTE — ED Triage Notes (Signed)
[facility] was called to home b/c pt was in and out of consciousness.  However, when [facility] arrived pt was able to answer all questions appropriately, though   20 g L FA.   HR AFIB 160 with no hx of  cbg 110  98.1  99% 2L  Capnography 30  124/74   Mentation improved with 500 ns

## 2019-12-12 NOTE — ED Notes (Signed)
Please call Mr. Haughey [phone]

## 2019-12-12 NOTE — ED Provider Notes (Signed)
[facility] EMERGENCY DEPARTMENT Provider Note   CSN: [id number] Arrival date & time: 12/12/19  1210     History Chief Complaint  Patient presents with   Altered Mental Status    covid positive    [name] is a 86 y.o. female.  HPI   Patient presents the ED for evaluation of weakness and falls associated with recent Covid diagnosis.  Patient was recently seen in the emergency room on August 12 and was discharged August 13 from the emergency room.  Patient initially presented with weakness.  She was found to be Covid positive.  Patient had been vaccinated for Covid.  Patient ultimately decided to go home.  She was not having any breathing difficulty.  Patient however states in the last couple of days she has had recurrent falls.  Today she was too weak to even stand.  She has not been able to eat or drink well.  She denies any vomiting or diarrhea.  She is not feeling short of breath.  No fevers.  Patient is having some upper back discomfort after her falls but denies any serious injuries.  Past Medical History:  Diagnosis Date   Anemia    Anemia, iron deficiency    CAD (coronary artery disease)    status Taxus drug-eluding stent to the LAD December 2006   Chronic renal disease    mild   Dermatitis 11/23/2012   Diabetes mellitus type II    Diverticulosis of colon    Edema    lower extremity with normal BNP   Encounter for long-term (current) use of medications    GERD (gastroesophageal reflux disease)    Glossitis    Hyperkalemia    Hyperlipidemia    Hypertension    Leg pain, left    Osteopenia    Pain in foot    bilateral   Polyneuropathy    Pyuria    Routine general medical examination at a health care facility    UTI (urinary tract infection)     Patient Active Problem List   Diagnosis Date Noted   Unstable angina ([place]) 12/14/2013   Chest pain 12/12/2013   Viral URI with cough 05/06/2013   Dermatitis  11/23/2012   Headache(784.0) 03/14/2012   VITAMIN B12 DEFICIENCY 05/12/2009   NEUROPATHY 05/03/2009   ANEMIA 03/31/2009   GERD 03/31/2009   EDEMA 03/31/2009   RENAL DISEASE, CHRONIC, MILD 01/06/2008   ANEMIA, IRON DEFICIENCY 08/31/2007   Pain in limb 04/20/2007   DIABETES MELLITUS, TYPE II 11/21/2006   HYPERLIPIDEMIA 11/21/2006   HYPERTENSION 11/21/2006   CORONARY ARTERY DISEASE 11/21/2006   DIVERTICULOSIS, COLON 11/21/2006   OSTEOPENIA 11/21/2006    Past Surgical History:  Procedure Laterality Date   CARDIOVASCULAR STRESS TEST  02/24/2006   CHOLECYSTECTOMY     ELECTROCARDIOGRAM  06/18/2006   ESOPHAGOGASTRODUODENOSCOPY  04/15/2006   LEFT HEART CATHETERIZATION WITH CORONARY ANGIOGRAM N/A 12/14/2013   Procedure: LEFT HEART CATHETERIZATION WITH CORONARY ANGIOGRAM;  Surgeon: Peter M Martinique, MD;  Location: [facility] CATH LAB;  Service: Cardiovascular;  Laterality: N/A;   PTCA     stent to LAD in 03/2005, recatheterization inJanuarry 2007 for recurrent chest pain  that showed a patent LAD stent with normal LV function   TOTAL ABDOMINAL HYSTERECTOMY  1969   no BSO     OB History   No obstetric history on file.     Family History  Problem Relation Age of Onset   Heart disease Mother    Heart  disease Sister     Social History   Tobacco Use   Smoking status: Never Smoker   Smokeless tobacco: Never Used  Substance Use Topics   Alcohol use: No   Drug use: No    Home Medications Prior to Admission medications   Medication Sig Start Date End Date Taking? Authorizing Provider  augmented betamethasone dipropionate (DIPROLENE-AF) 0.05 % cream Apply 1 application topically at bedtime as needed for rash. 06/07/19   [provider]  Blood Glucose Monitoring Suppl (ONE TOUCH ULTRA MINI) W/DEVICE KIT Use to check blood sugar twice a day.  DX 250.00 03/24/13   Mosie Lukes, MD  calcium carbonate (TUMS - DOSED IN MG ELEMENTAL CALCIUM) 500 MG chewable  tablet Chew 1 tablet by mouth daily as needed for indigestion.     [provider]  carvedilol (COREG) 6.25 MG tablet Take 6.25 mg by mouth in the morning and at bedtime.  01/10/15   [provider]  clopidogrel (PLAVIX) 75 MG tablet Take 1 tablet by mouth once daily with breakfast 04/19/19   Burnell Blanks, MD  fenofibrate 54 MG tablet Take 1 tablet (54 mg total) by mouth daily. 11/13/12   Mosie Lukes, MD  glipiZIDE (GLUCOTROL XL) 10 MG 24 hr tablet Take 10 mg by mouth daily before breakfast. 12/12/15   [provider]  glucose blood test strip ONE TOUCH ULTRA TEST STRIP. Use as instructed to check blood sugar twice a day.  Dx 250.00 03/24/13   Mosie Lukes, MD  Insulin Glargine (LANTUS SOLOSTAR) 100 UNIT/ML Solostar Pen Inject 20 Units into the skin at bedtime.  05/24/15   [provider]  Insulin Pen Needle 31G X 8 MM MISC 1 each by Does not apply route 2 (two) times daily before a meal. 06/27/11   Hodgin, Mora Appl, MD  lisinopril (PRINIVIL,ZESTRIL) 2.5 MG tablet Take 2.5 mg by mouth daily.    [provider]  lovastatin (MEVACOR) 40 MG tablet Take 40 mg by mouth at bedtime.    [provider]  nitroGLYCERIN (NITROSTAT) 0.4 MG SL tablet Place 0.4 mg under the tongue every 5 (five) minutes as needed for chest pain.    [provider]  permethrin (ELIMITE) 5 % cream Apply 1 application topically at bedtime as needed for rash. 06/07/19   [provider]    Allergies    Corticosteroids, Sulfonamide derivatives, and Pioglitazone  Review of Systems   Review of Systems  All other systems reviewed and are negative.   Physical Exam Updated Vital Signs BP 93/62    Pulse 63    Temp 99.5 F (37.5 C) (Rectal)    Resp (!) 27    Ht 1.702 m (_0 )    Wt 68 kg    SpO2 97%    BMI 23.49 kg/m   Physical Exam Vitals and nursing note reviewed.  Constitutional:      Appearance: She is ill-appearing. She is not  toxic-appearing.  HENT:     Head: Normocephalic and atraumatic.     Right Ear: External ear normal.     Left Ear: External ear normal.  Eyes:     General: No scleral icterus.       Right eye: No discharge.        Left eye: No discharge.     Conjunctiva/sclera: Conjunctivae normal.  Neck:     Trachea: No tracheal deviation.  Cardiovascular:     Rate and Rhythm: Tachycardia present. Rhythm  irregular.  Pulmonary:     Effort: Pulmonary effort is normal. No respiratory distress.     Breath sounds: Normal breath sounds. No stridor. No wheezing or rales.  Abdominal:     General: Bowel sounds are normal. There is no distension.     Palpations: Abdomen is soft.     Tenderness: There is no abdominal tenderness. There is no guarding or rebound.  Genitourinary:    Comments: Small amount of brown stool noted in the patient's diaper Musculoskeletal:        General: No tenderness.     Cervical back: Neck supple.  Skin:    General: Skin is warm and dry.     Findings: No rash.  Neurological:     Mental Status: She is alert.     Cranial Nerves: No cranial nerve deficit (no facial droop, extraocular movements intact, no slurred speech).     Sensory: No sensory deficit.     Motor: Weakness present. No abnormal muscle tone or seizure activity.     Coordination: Coordination normal.     Comments: Moves all extremities, no facial droop, no slurred speech, generalized weakness     ED Results / Procedures / Treatments   Labs (all labs ordered are listed, but only abnormal results are displayed) Labs Reviewed  CBC WITH DIFFERENTIAL/PLATELET - Abnormal; Notable for the following components:      Result Value   WBC 3.0 (*)    Neutro Abs 1.3 (*)    All other components within normal limits  COMPREHENSIVE METABOLIC PANEL - Abnormal; Notable for the following components:   Sodium 133 (*)    Potassium 5.7 (*)    CO2 17 (*)    Glucose, Bld 171 (*)    BUN 27 (*)    Creatinine, Ser 1.72 (*)     Calcium 7.8 (*)    Total Protein 5.7 (*)    Albumin 2.8 (*)    AST 68 (*)    Total Bilirubin 1.7 (*)    GFR calc non Af Amer 27 (*)    GFR calc Af Amer 31 (*)    All other components within normal limits  D-DIMER, QUANTITATIVE (NOT AT [facility]) - Abnormal; Notable for the following components:   D-Dimer, Quant 1.06 (*)    All other components within normal limits  LACTATE DEHYDROGENASE - Abnormal; Notable for the following components:   LDH 394 (*)    All other components within normal limits  FIBRINOGEN - Abnormal; Notable for the following components:   Fibrinogen 510 (*)    All other components within normal limits  C-REACTIVE PROTEIN - Abnormal; Notable for the following components:   CRP 4.1 (*)    All other components within normal limits  CULTURE, BLOOD (ROUTINE X 2)  CULTURE, BLOOD (ROUTINE X 2)  LACTIC ACID, PLASMA  FERRITIN  TRIGLYCERIDES  LACTIC ACID, PLASMA  PROCALCITONIN    EKG EKG Interpretation  Date/Time:  Sunday December 12 2019 12:34:29 EDT Ventricular Rate:  149 PR Interval:    QRS Duration: 88 QT Interval:  275 QTC Calculation: 433 R Axis:   37 Text Interpretation: Atrial fibrillation with rapid V-rate Ventricular premature complex Repolarization abnormality, prob rate related a fib is new since last tracing Confirmed by Dorie Rank [phone]) on 12/12/2019 12:51:58 PM   Radiology CT Cervical Spine Wo Contrast  Result Date: 12/12/2019 CLINICAL DATA:  Patient with multiple falls. EXAM: CT CERVICAL SPINE WITHOUT CONTRAST TECHNIQUE: Multidetector CT imaging of the cervical spine was  performed without intravenous contrast. Multiplanar CT image reconstructions were also generated. COMPARISON:  None. FINDINGS: Alignment: Normal. Skull base and vertebrae: No acute fracture. No primary bone lesion or focal pathologic process. Soft tissues and spinal canal: No prevertebral fluid or swelling. No visible canal hematoma. Disc levels: Multilevel degenerative disc disease most  pronounced C4-5. No acute fracture. Preservation of the vertebral body heights. Upper chest: Negative. Other: None IMPRESSION: 1. No acute cervical spine fracture. 2. Multilevel degenerative disc disease. Electronically Signed   By: Lovey Newcomer M.D.   On: 12/12/2019 13:33   DG Chest Port 1 View  Result Date: 12/12/2019 CLINICAL DATA:  Weakness.  Post fall.  COVID-19 positive. EXAM: PORTABLE CHEST 1 VIEW COMPARISON:  12/09/2019 FINDINGS: Patient is slightly rotated to the left. Lungs are adequately inflated possible subtle hazy density lateral left mid lung. No effusion. Cardiomediastinal silhouette and remainder of the exam is unchanged. IMPRESSION: Subtle hazy density lateral left mid lung which may be due to atelectasis or infection. Electronically Signed   By: Marin Olp M.D.   On: 12/12/2019 13:25    Procedures .Critical Care Performed by: Dorie Rank, MD Authorized by: Dorie Rank, MD   Critical care provider statement:    Critical care time (minutes):  45   Critical care was time spent personally by me on the following activities:  Discussions with consultants, evaluation of patient's response to treatment, examination of patient, ordering and performing treatments and interventions, ordering and review of laboratory studies, ordering and review of radiographic studies, pulse oximetry, re-evaluation of patient's condition, obtaining history from patient or surrogate and review of old charts   (including critical care time)  Medications Ordered in ED Medications  remdesivir 200 mg in sodium chloride 0.9% 250 mL IVPB (has no administration in time range)    Followed by  remdesivir 100 mg in sodium chloride 0.9 % 100 mL IVPB (has no administration in time range)  sodium chloride 0.9 % bolus 500 mL (has no administration in time range)  sodium chloride 0.9 % bolus 500 mL (500 mLs Intravenous New Bag/Given 12/12/19 1312)    ED Course  I have reviewed the triage vital signs and the  nursing notes.  Pertinent labs & imaging results that were available during my care of the patient were reviewed by me and considered in my medical decision making (see chart for details).  Clinical Course as of Dec 11 1449  Sun Dec 12, 2019  1443 Labs show worsening renal insufficiency and non-anion gap metabolic acidosis.   [GY]  6948 Inflammatory markers are elevated consistent with her known Covid virus infection   [JK]  1444 No signs of serious injury associated with her fall.  CT films negative   [JK]  1450 Heart rate still fluctuating.  BP borderline.  Will order additional fluid bolus.  Appears dehydrated.  Continue to use caution against fluid overload.  Wil hold on cardizem right now with bp borderline.   [JK]    Clinical Course User Index [JK] Dorie Rank, MD   MDM Rules/Calculators/A&P     CHA2DS2-VASc Score: 5                     Pt presents with increasing weakness, known covid diagnosis.  Subtle infiltrate on cxr.  No dyspnea or oxgyen requirement.  Pt with new onset a fib rvr.   Appears dehydrated, notable metabolic acidosis.  IV hydration ordered.  Remdesivir for covid.  Consult with medical service for admisison,  further treatment. Final Clinical Impression(s) / ED Diagnoses Final diagnoses:  [id number] virus infection  Atrial fibrillation, unspecified type [facility])      Dorie Rank, MD 12/12/19 1453

## 2019-12-12 NOTE — ED Notes (Signed)
CBG 152  

## 2019-12-13 DIAGNOSIS — U071 COVID-19: Principal | ICD-10-CM

## 2019-12-13 LAB — CBG MONITORING, ED
Glucose-Capillary: 140 mg/dL — ABNORMAL HIGH (ref 70–99)
Glucose-Capillary: 152 mg/dL — ABNORMAL HIGH (ref 70–99)
Glucose-Capillary: 117 mg/dL — ABNORMAL HIGH (ref 70–99)
Glucose-Capillary: 76 mg/dL (ref 70–99)

## 2019-12-13 LAB — D-DIMER, QUANTITATIVE: D-Dimer, Quant: 1.22 ug/mL-FEU — ABNORMAL HIGH (ref 0.00–0.50)

## 2019-12-13 LAB — CBC
HCT: 36.7 % (ref 36.0–46.0)
Hemoglobin: 11.6 g/dL — ABNORMAL LOW (ref 12.0–15.0)
MCH: 29.3 pg (ref 26.0–34.0)
MCHC: 31.6 g/dL (ref 30.0–36.0)
MCV: 92.7 fL (ref 80.0–100.0)
Platelets: 156 10*3/uL (ref 150–400)
RBC: 3.96 MIL/uL (ref 3.87–5.11)
RDW: 12.2 % (ref 11.5–15.5)
WBC: 3 10*3/uL — ABNORMAL LOW (ref 4.0–10.5)
nRBC: 0 % (ref 0.0–0.2)

## 2019-12-13 LAB — COMPREHENSIVE METABOLIC PANEL
ALT: 26 U/L (ref 0–44)
AST: 62 U/L — ABNORMAL HIGH (ref 15–41)
Albumin: 2.5 g/dL — ABNORMAL LOW (ref 3.5–5.0)
Alkaline Phosphatase: 34 U/L — ABNORMAL LOW (ref 38–126)
Anion gap: 9 (ref 5–15)
BUN: 24 mg/dL — ABNORMAL HIGH (ref 8–23)
CO2: 20 mmol/L — ABNORMAL LOW (ref 22–32)
Calcium: 7.3 mg/dL — ABNORMAL LOW (ref 8.9–10.3)
Chloride: 107 mmol/L (ref 98–111)
Creatinine, Ser: 1.52 mg/dL — ABNORMAL HIGH (ref 0.44–1.00)
GFR calc Af Amer: 36 mL/min — ABNORMAL LOW (ref >60)
GFR calc non Af Amer: 31 mL/min — ABNORMAL LOW (ref >60)
Glucose, Bld: 118 mg/dL — ABNORMAL HIGH (ref 70–99)
Potassium: 4 mmol/L (ref 3.5–5.1)
Sodium: 136 mmol/L (ref 135–145)
Total Bilirubin: 0.5 mg/dL (ref 0.3–1.2)
Total Protein: 5.1 g/dL — ABNORMAL LOW (ref 6.5–8.1)

## 2019-12-13 LAB — C-REACTIVE PROTEIN: CRP: 5.7 mg/dL — ABNORMAL HIGH (ref <1.0)

## 2019-12-13 MED ORDER — GUAIFENESIN-DM 100-10 MG/5ML PO SYRP
5.0000 mL | ORAL_SOLUTION | ORAL | Status: DC | PRN
  Administered 2019-12-13: 5 mL via ORAL
  Filled 2019-12-13: qty 5

## 2019-12-13 NOTE — ED Notes (Signed)
Ordered breakfast 

## 2019-12-13 NOTE — ED Notes (Signed)
CBG 117 

## 2019-12-13 NOTE — Progress Notes (Signed)
Date: 12/13/2019  Patient name: [name]  Medical record number: [id number]  Date of birth: 27-Sep-1933   I have seen and evaluated [name] and discussed their care with the Residency Team.  In brief, patient is an 86 year old female with past medical history of CAD status post DES x2, hypertension, hyperlipidemia and diabetes type 2 who presented to the ED with worsening weakness over the last week.  Patient states that her husband began to have cough and fatigue last week and shortly after that many of her family members became sick with Covid.  Patient herself began to experience weakness, malaise as well as decreased appetite and associated nausea.  Patient had a fall on August 11 and came to the ED for further evaluation.  She was found to be Covid positive at that time.  She was discharged home with home health and a plan for supportive care.  However, patient began to have worsening lightheadedness and weakness (associated with her decreased oral intake) as well as worsening mentation and was brought to the ED for further evaluation.  She was found to have A. fib with RVR at that time and was admitted for further evaluation.  No chest pain, no palpitations, no headache, no blurry vision, no focal weakness, tingling or numbness, no fevers or chills, no shortness of breath.  Patient did complain of lightheadedness with associated fatigue.  Patient still complains of decreased appetite and did not have anything to eat for breakfast.  She still complains of generalized weakness and fatigue.  PMHx, Fam Hx, and/or Soc Hx : As per resident admit note  Vitals:   12/13/19 1214 12/13/19 1229  BP: (!) 165/56 106/69  Pulse: (!) 59 64  Resp: (!) 23 16  Temp:    SpO2: 99% 99%   General: Awake, alert, x3, NAD CVS: Regular rate and rhythm, normal heart sounds Lungs: CTA bilaterally Abdomen: Soft, nontender, nondistended, normoactive bowel sounds Extremities: No edema noted,  nontender to palpation Psych: Normal mood and affect HEENT: Normocephalic, atraumatic Skin: Warm and dry  Assessment and Plan: I have seen and evaluated the patient as outlined above. I agree with the formulated Assessment and Plan as detailed in the residents' note, with the following changes:   1.  New onset A. fib with RVR: -Patient presented to the ED with progressive weakness in the setting of decreased appetite and oral intake secondary to Covid infection.  She was found to be in A. fib with RVR on admission and was admitted for further evaluation. -Patient appears to be in normal sinus rhythm today. -We will continue with metoprolol and Eliquis for now (patient has a CHA2DS2-VASc score of 5) -Continue with IV fluids given decreased appetite and oral intake (will start patient on LR at 100 cc/h for maintenance) -TSH is within normal limits -Continue to monitor patient on telemetry  2.  Covid positive: -Patient was noted to be Covid positive on her visit to the ED last week and remains Covid positive currently.  I suspect that her decreased appetite and malaise is likely secondary to her underlying infection. -Patient also did have mildly elevated inflammatory markers.  We will continue to trend these for now. -She did have 1 shot of the Covid vaccine (possibly [employer]) -She has no evidence of hypoxia and has no indication for steroids at this time -We will continue with remdesivir day 2 -We will trend LFTs -No further work-up for now  3.  AKI on CKD: -Patient  presented to the ED with an elevated creatinine 1.72 and a potassium of 5.7 in the setting of decreased oral intake secondary to her underlying Covid infection.  I suspect that this AKI is likely prerenal in etiology. -Patient's creatinine is improved to 1.52 today which appears to be her baseline and her potassium has normalized -No further work-up at this time.  We will continue to monitor closely.  Earl Lagos, MD 8/16/20211:18 PM

## 2019-12-14 LAB — COMPREHENSIVE METABOLIC PANEL
ALT: 27 U/L (ref 0–44)
AST: 57 U/L — ABNORMAL HIGH (ref 15–41)
Albumin: 2.6 g/dL — ABNORMAL LOW (ref 3.5–5.0)
Alkaline Phosphatase: 35 U/L — ABNORMAL LOW (ref 38–126)
Anion gap: 12 (ref 5–15)
BUN: 24 mg/dL — ABNORMAL HIGH (ref 8–23)
CO2: 21 mmol/L — ABNORMAL LOW (ref 22–32)
Calcium: 7.9 mg/dL — ABNORMAL LOW (ref 8.9–10.3)
Chloride: 106 mmol/L (ref 98–111)
Creatinine, Ser: 1.45 mg/dL — ABNORMAL HIGH (ref 0.44–1.00)
GFR calc Af Amer: 38 mL/min — ABNORMAL LOW (ref >60)
GFR calc non Af Amer: 33 mL/min — ABNORMAL LOW (ref >60)
Glucose, Bld: 80 mg/dL (ref 70–99)
Potassium: 3.8 mmol/L (ref 3.5–5.1)
Sodium: 139 mmol/L (ref 135–145)
Total Bilirubin: 0.4 mg/dL (ref 0.3–1.2)
Total Protein: 5.6 g/dL — ABNORMAL LOW (ref 6.5–8.1)

## 2019-12-14 LAB — C DIFFICILE QUICK SCREEN W PCR REFLEX
C Diff antigen: NEGATIVE
C Diff interpretation: NOT DETECTED
C Diff toxin: NEGATIVE

## 2019-12-14 LAB — CBC
HCT: 37.2 % (ref 36.0–46.0)
Hemoglobin: 12.2 g/dL (ref 12.0–15.0)
MCH: 29.2 pg (ref 26.0–34.0)
MCHC: 32.8 g/dL (ref 30.0–36.0)
MCV: 89 fL (ref 80.0–100.0)
Platelets: 156 10*3/uL (ref 150–400)
RBC: 4.18 MIL/uL (ref 3.87–5.11)
RDW: 12.4 % (ref 11.5–15.5)
WBC: 2.6 10*3/uL — ABNORMAL LOW (ref 4.0–10.5)
nRBC: 0 % (ref 0.0–0.2)

## 2019-12-14 LAB — GLUCOSE, CAPILLARY
Glucose-Capillary: 72 mg/dL (ref 70–99)
Glucose-Capillary: 71 mg/dL (ref 70–99)
Glucose-Capillary: 100 mg/dL — ABNORMAL HIGH (ref 70–99)

## 2019-12-14 LAB — D-DIMER, QUANTITATIVE: D-Dimer, Quant: 1.49 ug/mL-FEU — ABNORMAL HIGH (ref 0.00–0.50)

## 2019-12-14 LAB — C-REACTIVE PROTEIN: CRP: 5.4 mg/dL — ABNORMAL HIGH (ref <1.0)

## 2019-12-14 MED ORDER — METOPROLOL SUCCINATE ER 100 MG PO TB24
100.0000 mg | ORAL_TABLET | Freq: Every day | ORAL | 0 refills | Status: DC
Start: 2019-12-14 — End: 2020-01-25

## 2019-12-14 MED ORDER — APIXABAN 2.5 MG PO TABS: 2.5000 mg | ORAL_TABLET | Freq: Two times a day (BID) | ORAL | 0 refills | Status: DC

## 2019-12-14 MED ORDER — ZINC OXIDE 40 % EX OINT: TOPICAL_OINTMENT | Freq: Three times a day (TID) | CUTANEOUS | 0 refills | Status: DC

## 2019-12-14 MED ORDER — LOPERAMIDE HCL 2 MG PO CAPS: 2.0000 mg | ORAL_CAPSULE | ORAL | 0 refills | Status: AC | PRN

## 2019-12-14 MED ORDER — ZINC OXIDE 40 % EX OINT
TOPICAL_OINTMENT | Freq: Three times a day (TID) | CUTANEOUS | Status: DC
  Filled 2019-12-14: qty 57

## 2019-12-14 MED ORDER — FLUCONAZOLE 150 MG PO TABS
150.0000 mg | ORAL_TABLET | Freq: Every day | ORAL | Status: DC
  Administered 2019-12-14: 150 mg via ORAL
  Filled 2019-12-14: qty 1

## 2019-12-14 MED ORDER — FLUCONAZOLE 150 MG PO TABS: 150.0000 mg | ORAL_TABLET | Freq: Every day | ORAL | 0 refills | Status: AC

## 2019-12-14 MED ORDER — LOPERAMIDE HCL 2 MG PO CAPS
2.0000 mg | ORAL_CAPSULE | ORAL | Status: DC | PRN
  Administered 2019-12-14: 2 mg via ORAL
  Filled 2019-12-14: qty 1

## 2019-12-14 NOTE — Discharge Summary (Signed)
Name: [name] MRN: [id number] DOB: [birth year] 86 y.o. PCP: Fransisca Connors, PA-C  Date of Admission: 12/12/2019 12:10 PM Date of Discharge: 12/14/2019 Attending Physician: Aldine Contes MD  Discharge Diagnosis: 1. New Atrial Fibrillation with RVR 2. COVID infection 3. Acute Kidney Injury on CKD3b  Discharge Medications: Allergies as of 12/14/2019      Reactions   Sulfa Antibiotics Hives, Rash   Corticosteroids Other (See Comments)   Hyperglycemia   Sulfonamide Derivatives Hives   Pioglitazone Other (See Comments)   REACTION: Edema      Medication List    STOP taking these medications   augmented betamethasone dipropionate 0.05 % cream Commonly known as: DIPROLENE-AF   carvedilol 6.25 MG tablet Commonly known as: COREG   lisinopril 2.5 MG tablet Commonly known as: ZESTRIL   permethrin 5 % cream Commonly known as: ELIMITE     TAKE these medications   apixaban 2.5 MG Tabs tablet Commonly known as: ELIQUIS Take 1 tablet (2.5 mg total) by mouth 2 (two) times daily.   calcium carbonate 500 MG chewable tablet Commonly known as: TUMS - dosed in mg elemental calcium Chew 1-2 tablets by mouth daily as needed for indigestion.   clopidogrel 75 MG tablet Commonly known as: PLAVIX Take 1 tablet by mouth once daily with breakfast   estradiol 0.1 MG/GM vaginal cream Commonly known as: ESTRACE Place 0.5-1 g vaginally See admin instructions. Insert into vagina 2-3 nights per week. Apply a small amount outside of vagina also for dryness.   fenofibrate 54 MG tablet Take 1 tablet (54 mg total) by mouth daily.   fluconazole 150 MG tablet Commonly known as: DIFLUCAN Take 1 tablet (150 mg total) by mouth daily for 3 days.   gabapentin 100 MG capsule Commonly known as: NEURONTIN Take 1-3 capsules by mouth 3 (three) times daily as needed.   glipiZIDE 10 MG 24 hr tablet Commonly known as: GLUCOTROL XL Take 10 mg by mouth daily before breakfast.   glucose  blood test strip ONE TOUCH ULTRA TEST STRIP. Use as instructed to check blood sugar twice a day.  Dx 250.00   Insulin Pen Needle 31G X 8 MM Misc 1 each by Does not apply route 2 (two) times daily before a meal. What changed: how to take this   Lantus SoloStar 100 UNIT/ML Solostar Pen Generic drug: insulin glargine Inject 20 Units into the skin at bedtime.   liver oil-zinc oxide 40 % ointment Commonly known as: DESITIN Apply topically 3 (three) times daily.   loperamide 2 MG capsule Commonly known as: IMODIUM Take 1 capsule (2 mg total) by mouth as needed for diarrhea or loose stools.   lovastatin 40 MG tablet Commonly known as: MEVACOR Take 40 mg by mouth at bedtime.   metoprolol succinate 100 MG 24 hr tablet Commonly known as: Toprol XL Take 1 tablet (100 mg total) by mouth daily. Take with or immediately following a meal.   nitroGLYCERIN 0.4 MG SL tablet Commonly known as: NITROSTAT Place 0.4 mg under the tongue every 5 (five) minutes as needed for chest pain.   ONE TOUCH ULTRA MINI w/Device Kit Use to check blood sugar twice a day.  DX 250.00            Discharge Care Instructions  (From admission, onward)         Start     Ordered   12/14/19 0000  Discharge wound care:       Comments: topical care using  house skin cleanser or tepid water to cleanse, pat dry followed by application of zinc oxide-liver oil ointment (Desitin 40%).   12/14/19 1529          Disposition and follow-up:   [name] was discharged from [facility] in Stable condition.  At the hospital follow up visit please address:  1. New Atrial Fibrillation with RVR - Check EKG - Ensure she takes her metoprolol as prescribed - Ensure she takes her eliquis as prescribed - Check cbc  2. COVID infection - Ensure she finished her 5 day remdesivir course at the [facility] center - Consider referral to post-covid care clinic if she has persistent symptoms:  [phone].  3. Acute Kidney Injury on CKD3b - Check bmp  2.  Labs / imaging needed at time of follow-up: cbc, bmp  3.  Pending labs/ test needing follow-up: N/A  Follow-up Appointments:  Follow-up Information    Fransisca Connors, PA-C. Call.   Specialty: Family Medicine Contact information: [place] [address] [place] [address] [phone]        Burnell Blanks, MD .   Specialty: Cardiology Contact information: [place] [address] [place] [address] [phone]        [facility]. Call.   Contact information: [address] [place] [address] [place] Hospital Course by problem list: 1. New Atrial Fibrillation with RVR: [name] is a 86 yo F w/ PMH of CAD s/p DES x2, HTN, HLD and DM who presented to [facility] with weakness. She was found to be in A.Fib with HR in 160s with hypotension. She was given 2L bolus with resolution of her hypotension and decrease in her heart rate to 100s. She was started on metoprolol tartrate 51m q6hr and eliquis for her CHADSVASC2 score of 5. Her heart rhythm converted to sinus rhythm on hospital day 1 and remained in sinus for the remainder of her hospital course. Discharged on eliquis and metoprolol succinate.  2. COVID infection: On admission found to have elevated inflammatory markers and COVID + status. She was noted to have been vaccinated with no oxygen requirement. She was started on remdesivir infusion and received 3 doses during hospitalization. [facility]  3. Acute Kidney Injury on CKD3b: On admission noted to have creatinine of 1.72 with K of 5.7. Thought to be due to dehydration as she appeared dry on exam. She was started on lokelma and provided fluid boluses with quick resolution of her hyperkalemia and AKI. Discharged with recommendation to f/u  with PCP.  Discharge Vitals:   BP (!) 154/62 (BP Location: Right Arm)   Pulse 60   Temp 98.3 F (36.8 C) (Oral)   Resp 20   Ht 5' 7"  (1.702 m)   Wt 71 kg   SpO2 96%   BMI 24.52 kg/m   Pertinent Labs, Studies, and Procedures:  BMP Latest Ref Rng & Units 12/14/2019 12/13/2019 12/12/2019  Glucose 70 - 99 mg/dL 80 118(H) 171(H)  BUN 8 - 23 mg/dL 24(H) 24(H) 27(H)  Creatinine 0.44 - 1.00 mg/dL 1.45(H) 1.52(H) 1.72(H)  Sodium 135 - 145 mmol/L 139 136 133(L)  Potassium 3.5 - 5.1 mmol/L 3.8 4.0 5.7(H)  Chloride 98 - 111 mmol/L 106 107 104  CO2 22 - 32 mmol/L 21(L) 20(L) 17(L)  Calcium 8.9 - 10.3 mg/dL 7.9(L) 7.3(L) 7.8(L)   CBC Latest Ref Rng & Units 12/14/2019 12/13/2019 12/12/2019  WBC 4.0 - 10.5 K/uL 2.6(L) 3.0(L) 3.0(L)  Hemoglobin 12.0 - 15.0 g/dL 12.2 11.6(L) 12.6  Hematocrit 36 - 46 % 37.2 36.7 39.4  Platelets 150 - 400 K/uL 156 156 166   PORTABLE CHEST 1 VIEW  FINDINGS: Patient is slightly rotated to the left. Lungs are adequately inflated possible subtle hazy density lateral left mid lung. No effusion. Cardiomediastinal silhouette and remainder of the exam is unchanged.  IMPRESSION: Subtle hazy density lateral left mid lung which may be due to atelectasis or infection.  Discharge Instructions: Discharge Instructions    Call MD for:  extreme fatigue   Complete by: As directed    Call MD for:  persistant dizziness or light-headedness   Complete by: As directed    Call MD for:  persistant nausea and vomiting   Complete by: As directed    Call MD for:  redness, tenderness, or signs of infection (pain, swelling, redness, odor or green/yellow discharge around incision site)   Complete by: As directed    Call MD for:  temperature >100.4   Complete by: As directed    Diet - low sodium heart healthy   Complete by: As directed    Discharge instructions   Complete by: As directed    Dear [name]  You came to [place] with weakness. We have determined this was  caused by irregular heart beat and covid infection. Here are our recommendations for you at discharge:  Please go to [facility] center for 2 additional days for remdesivir Please stop your carvedilol. This will be replaced with metoprolol succinate 147m daily Please take fluconazole for 2 additional days Please take eliquis 2.533mtwice daily  Thank you for choosing [facility]   Discharge wound care:   Complete by: As directed    topical care using house skin cleanser or tepid water to cleanse, pat dry followed by application of zinc oxide-liver oil ointment (Desitin 40%).   Increase activity slowly   Complete by: As directed      Signed: LeMosetta AnisMD 12/15/2019, 3:54 PM Pager: [phone] 5pm on weekdays and 1pm on weekends: On Call Pager: [phone]

## 2019-12-14 NOTE — TOC Benefit Eligibility Note (Signed)
Transition of Care [facility]) Benefit Eligibility Note    Patient Details  Name: MAKARI SANKO MRN: [id number] Date of Birth: 05-Jul-1933   Medication/Dose: Arne Cleveland  5 MG BID  Covered?: Yes     Prescription Coverage Preferred Pharmacy: [facility]  Spoke with Person/Company/Phone Number:: JANE   @ [employer] # [phone]  Co-Pay: $ 47.00  Prior Approval: No  Deductible: Met       [facility] Phone Number: 12/14/2019, 11:06 AM

## 2019-12-14 NOTE — Discharge Instructions (Signed)
Patient scheduled for outpatient Remdesivir infusions at 2pm on Wednesday 8/18 and Thursday 8/19 at [facility]. Please inform the patient to park at [address] [place], [place], as staff will be escorting the patient through the east entrance of the hospital.    There is a wave flag banner located near the entrance on [address]. Turn into this entrance and immediately turn left and park in 1 of the 5 designated Covid Infusion Parking spots. There is a phone number on the sign, please call and let the staff know what spot you are in and we will come out and get you. For questions call [phone].  Thanks.  10 Things You Can Do to Manage Your COVID-19 Symptoms at Home If you have possible or confirmed COVID-19: 1. Stay home from work and school. And stay away from other public places. If you must go out, avoid using any kind of public transportation, ridesharing, or taxis. 2. Monitor your symptoms carefully. If your symptoms get worse, call your healthcare provider immediately. 3. Get rest and stay hydrated. 4. If you have a medical appointment, call the healthcare provider ahead of time and tell them that you have or may have COVID-19. 5. For medical emergencies, call 911 and notify the dispatch personnel that you have or may have COVID-19. 6. Cover your cough and sneezes with a tissue or use the inside of your elbow. 7. Wash your hands often with soap and water for at least 20 seconds or clean your hands with an alcohol-based hand sanitizer that contains at least 60% alcohol. 8. As much as possible, stay in a specific room and away from other people in your home. Also, you should use a separate bathroom, if available. If you need to be around other people in or outside of the home, wear a mask. 9. Avoid sharing personal items with other people in your household, like dishes, towels, and bedding. 10. Clean all surfaces that are touched often, like counters, tabletops, and doorknobs. Use  household cleaning sprays or wipes according to the label instructions. [email or web address] 10/28/2018 This information is not intended to replace advice given to you by your health care provider. Make sure you discuss any questions you have with your health care provider. Document Revised: 04/01/2019 Document Reviewed: 04/01/2019 Elsevier Patient Education  2020 [employer].

## 2019-12-14 NOTE — Consult Note (Signed)
WOC Nurse Consult Note: Reason for Consult: Erythematous, confluent eruption on bilateral buttocks, perineum and medial thighs. Few satellite lesions noted at periphery, consistent  Incontinence Associated Dermatitis (IAD) with superimposed fungal infection. Patient has been incontinent of loose stools and urine and with recent history of UTI. This is NOT a pressure injury. Wound type: Moisture Associated Skin Damage (MASD) Pressure Injury POA:N/A Measurement: N/A  Diffuse presentation Wound bed: Two pinpoint areas of epidermal erosion, pink wound bed, serous exudate in a scant amount Drainage (amount, consistency, odor) See above Periwound: Intact Dressing procedure/placement/frequency: Patient describes area as painful-also noted that with loose stools there is intense burning sensation. I will provide Nursing with guidance for topical care using house skin cleanser or tepid water to cleanse, pat dry followed by application of zinc oxide-liver oil ointment (Desitin 40%). Patient will be provided with a mattress replacement with low air loss feature for microclimate management and is to be turned from side to side with a pillow placed between her knees to promote air flow.  Time in the supine position is to me minimized. A pressure redistribution chair pad is provided for OOB use. Bilateral pressure redistribution heel boots are provided to pressure injury prevention.  Recommend systemic antifungal (e.g., Diflucan) for several days to assist with the timely resolution of this skin infection. If you agree, please order.  WOC nursing team will not follow, but will remain available to this patient, the nursing and medical teams.  Please re-consult if needed. Thanks, Ladona Mow, MSN, RN, GNP, [place]  Pager# [phone]

## 2019-12-14 NOTE — Evaluation (Signed)
Physical Therapy Evaluation Patient Details Name: [name] MRN: [id number] DOB: 11-28-1933 Today's Date: 12/14/2019   History of Present Illness  86 yo female presented to ED with worsening weakness, cough, and fatigue. Tested COVID positive 12/09/19 during recent admission to hospital due to fall; dc to home with [facility] services on 813/21. PMH including CAD status post DES x2, hypertension, hyperlipidemia and diabetes type 2.   Clinical Impression  Pt presents to PT with generalized deconditioning due to illness and inactivity. Pt's spouse is currently hospitalized at another facility with Covid. Daughter who also is positive for Covid is at home and per pt is supportive. If daughter or another family member can provide a few days of close to 24 hour assist for household task (meals, bathing) feel pt can return home with HHPT and HHOT.     Follow Up Recommendations Home health PT;Supervision/Assistance - 24 hour (supervision for a few days)    Equipment Recommendations  None recommended by PT    Recommendations for Other Services       Precautions / Restrictions Precautions Precautions: Fall      Mobility  Bed Mobility               General bed mobility comments: Pt up in chair  Transfers Overall transfer level: Needs assistance Equipment used: Rolling walker (2 wheeled) Transfers: Sit to/from Stand Sit to Stand: Supervision         General transfer comment: supervision for safety and lines  Ambulation/Gait Ambulation/Gait assistance: Supervision Gait Distance (Feet): 90 Feet Assistive device: Rolling walker (2 wheeled) Gait Pattern/deviations: Step-through pattern;Decreased stride length Gait velocity: decr Gait velocity interpretation: 1.31 - 2.62 ft/sec, indicative of limited community ambulator General Gait Details: Steady gait with walker. Supervision for safety/lines  Stairs            Wheelchair Mobility    Modified Rankin (Stroke Patients  Only)       Balance Overall balance assessment: Needs assistance Sitting-balance support: Feet supported;No upper extremity supported Sitting balance-Leahy Scale: Good     Standing balance support: No upper extremity supported;During functional activity Standing balance-Leahy Scale: Fair                               Pertinent Vitals/Pain Pain Assessment: No/denies pain    Home Living Family/patient expects to be discharged to:: Private residence Living Arrangements: Spouse/significant other (currently in the hospital) Available Help at Discharge: Family (daughter) Type of Home: House Home Access: Stairs to enter   Secretary/administrator of Steps: 7 Home Layout: One level Home Equipment: Environmental consultant - 2 wheels;Cane - single point      Prior Function Level of Independence: Independent               Hand Dominance        Extremity/Trunk Assessment   Upper Extremity Assessment Upper Extremity Assessment: Defer to OT evaluation    Lower Extremity Assessment Lower Extremity Assessment: Generalized weakness       Communication   Communication: HOH  Cognition Arousal/Alertness: Awake/alert Behavior During Therapy: WFL for tasks assessed/performed Overall Cognitive Status: No family/caregiver present to determine baseline cognitive functioning                                 General Comments: Decr short term memory which I suspect is baseline      General  Comments General comments (skin integrity, edema, etc.): SpO2 >92% on RA    Exercises     Assessment/Plan    PT Assessment Patient needs continued PT services  PT Problem List Decreased strength;Decreased activity tolerance;Decreased balance;Decreased mobility       PT Treatment Interventions DME instruction;Gait training;Stair training;Functional mobility training;Therapeutic activities;Therapeutic exercise;Balance training;Patient/family education    PT Goals (Current  goals can be found in the Care Plan section)  Acute Rehab PT Goals Patient Stated Goal: go home PT Goal Formulation: With patient Time For Goal Achievement: 12/21/19 Potential to Achieve Goals: Good    Frequency Min 3X/week   Barriers to discharge Inaccessible home environment;Decreased caregiver support stairs to enter and spouse in the hospital    Co-evaluation               AM-PAC PT "6 Clicks" Mobility  Outcome Measure Help needed turning from your back to your side while in a flat bed without using bedrails?: None Help needed moving from lying on your back to sitting on the side of a flat bed without using bedrails?: None Help needed moving to and from a bed to a chair (including a wheelchair)?: A Little Help needed standing up from a chair using your arms (e.g., wheelchair or bedside chair)?: A Little Help needed to walk in hospital room?: A Little Help needed climbing 3-5 steps with a railing? : A Little 6 Click Score: 20    End of Session   Activity Tolerance: Patient tolerated treatment well Patient left: in chair;with call bell/phone within reach;with chair alarm set   PT Visit Diagnosis: Muscle weakness (generalized) (M62.81);History of falling (Z91.81)    Time: 1610-9604 PT Time Calculation (min) (ACUTE ONLY): 15 min   Charges:   PT Evaluation $PT Eval Moderate Complexity: 1 Mod          [facility] PT Acute Rehabilitation Services Pager [phone] Office [phone]   Angelina Ok [facility] 12/14/2019, 3:06 PM

## 2019-12-14 NOTE — Progress Notes (Signed)
Patient scheduled for outpatient Remdesivir infusions at 2pm on Wednesday 8/18 and Thursday 8/19 at [facility]. Please inform the patient to park at [address] [place], [place], as staff will be escorting the patient through the east entrance of the hospital.    There is a wave flag banner located near the entrance on [address]. Turn into this entrance and immediately turn left and park in 1 of the 5 designated Covid Infusion Parking spots. There is a phone number on the sign, please call and let the staff know what spot you are in and we will come out and get you. For questions call [phone].  Thanks.

## 2019-12-14 NOTE — Evaluation (Addendum)
1347 Occupational Therapy Evaluation Patient Details Name: [name] MRN: [id number] DOB: 1933-09-04 Today's Date: 12/14/2019    History of Present Illness 86 yo female presented to ED with worsening weakness, cough, and fatigue. Tested COVID positive 12/09/19 during recent admission to hospital due to fall; dc to home with [facility] services on 813/21. PMH including CAD status post DES x2, hypertension, hyperlipidemia and diabetes type 2.    Clinical Impression   PTA, pt was living with her husband and was independent. Pt currently performing ADLs and functional mobility at Supervision level with RW. Pt presenting with decreased activity tolerance as seen by fatigue requiring increased rest breaks during activity. SpO2 maintaining in 90s on RA throughout session. Pt would benefit from further acute OT to facilitate safe dc. Recommend dc to home with HHOT for further OT to optimize safety, independence with ADLs, and return to PLOF. Will also require initial 24/7 support and will need to confirm.    Follow Up Recommendations  Home health OT;Supervision/Assistance - 24 hour    Equipment Recommendations  3 in 1 bedside commode    Recommendations for Other Services PT consult     Precautions / Restrictions Precautions Precautions: Fall      Mobility Bed Mobility Overal bed mobility: Needs Assistance Bed Mobility: Supine to Sit     Supine to sit: Supervision     General bed mobility comments: Supervision for safety  Transfers Overall transfer level: Needs assistance Equipment used: Rolling walker (2 wheeled) Transfers: Sit to/from Stand Sit to Stand: Supervision         General transfer comment: supervision for safety and lines    Balance Overall balance assessment: Needs assistance Sitting-balance support: Feet supported;No upper extremity supported Sitting balance-Leahy Scale: Good     Standing balance support: No upper extremity supported;During functional  activity Standing balance-Leahy Scale: Fair                             ADL either performed or assessed with clinical judgement   ADL Overall ADL's : Needs assistance/impaired                                       General ADL Comments: Pt performing ADLs at supervision level. Use of RW. CUes for RW management. PT performing LB dressing and functional mobility in hallway     Vision         Perception     Praxis      Pertinent Vitals/Pain Pain Assessment: No/denies pain     Hand Dominance Right   Extremity/Trunk Assessment Upper Extremity Assessment Upper Extremity Assessment: Generalized weakness   Lower Extremity Assessment Lower Extremity Assessment: Defer to PT evaluation   Cervical / Trunk Assessment Cervical / Trunk Assessment: Kyphotic   Communication Communication Communication: HOH   Cognition Arousal/Alertness: Awake/alert Behavior During Therapy: WFL for tasks assessed/performed Overall Cognitive Status: No family/caregiver present to determine baseline cognitive functioning                                 General Comments: Feel pt is close to baseline. Decreased ST memory and requiring increased time   General Comments  SpO2 maintaining in 90s on RA throughout. HR 60-80s. RR 20s    Exercises     Shoulder Instructions  Home Living Family/patient expects to be discharged to:: Private residence Living Arrangements: Spouse/significant other (currently in the hospital) Available Help at Discharge: Family (daughter) Type of Home: House Home Access: Stairs to enter Secretary/administrator of Steps: 7   Home Layout: One level     Bathroom Shower/Tub: Chief Strategy Officer: Handicapped height     Home Equipment: Environmental consultant - 2 wheels;Cane - single point          Prior Functioning/Environment Level of Independence: Independent                 OT Problem List: Decreased  strength;Decreased activity tolerance;Impaired balance (sitting and/or standing);Decreased knowledge of use of DME or AE;Decreased knowledge of precautions      OT Treatment/Interventions: Self-care/ADL training;Therapeutic exercise;Energy conservation;DME and/or AE instruction;Therapeutic activities;Patient/family education    OT Goals(Current goals can be found in the care plan section) Acute Rehab OT Goals Patient Stated Goal: "Go home. There is no place like home." OT Goal Formulation: With patient Time For Goal Achievement: 12/28/19 Potential to Achieve Goals: Good  OT Frequency: Min 2X/week   Barriers to D/C:            Co-evaluation              AM-PAC OT "6 Clicks" Daily Activity     Outcome Measure Help from another person eating meals?: A Little Help from another person taking care of personal grooming?: A Little Help from another person toileting, which includes using toliet, bedpan, or urinal?: A Little Help from another person bathing (including washing, rinsing, drying)?: A Little Help from another person to put on and taking off regular upper body clothing?: A Little Help from another person to put on and taking off regular lower body clothing?: A Little 6 Click Score: 18   End of Session Equipment Utilized During Treatment: Rolling walker Nurse Communication: Mobility status  Activity Tolerance: Patient tolerated treatment well Patient left: in chair;with call bell/phone within reach;with chair alarm set  OT Visit Diagnosis: Unsteadiness on feet (R26.81);Other abnormalities of gait and mobility (R26.89);Muscle weakness (generalized) (M62.81)                Time: 4782-9562 OT Time Calculation (min): 33 min Charges:  OT General Charges $OT Visit: 1 Visit OT Evaluation $OT Eval Moderate Complexity: 1 Mod OT Treatments $Self Care/Home Management : 8-22 mins  Corley Kohls MSOT, OTR/L Acute Rehab Pager: [phone] Office: [phone]  Theodoro Grist  Emmitte Surgeon 12/14/2019, 3:10 PM

## 2019-12-14 NOTE — Plan of Care (Signed)
All met prior to discharged home

## 2019-12-14 NOTE — Progress Notes (Addendum)
   Subjective:  [name] is an 86 yo F w/ PMH of CAD s/p DESx2, HTN, HLD and DM admit for A.Fib w/ RVR likely 2/2 COVID infection on hospital day 2  [name] was examined and evaluated at bedside this am. She mentions feeling well and denies any significant symptoms. She mentions having diarrhea overnight but denies any current diarrhea. She denies any shortness of breath, chest pain, palpitations  Objective:  Vital signs in last 24 hours: Vitals:   12/13/19 2126 12/14/19 0002 12/14/19 0122 12/14/19 0415  BP: (!) 163/89 (!) 181/60 (!) 158/66 (!) 164/62  Pulse: 69 60  61  Resp: 19 20  20   Temp: 98.6 F (37 C) 99.4 F (37.4 C)  99.5 F (37.5 C)  TempSrc: Oral Oral  Oral  SpO2: 99% 98%  99%  Weight: 71 kg     Height: 5\' 7"  (1.702 m)      Gen: Well-developed, well nourished, NAD HEENT: NCAT head, MMM CV: Bradycardic, regular rhythm, no murmurs, no wheezes Pulm: CTAB, no cough, no distress  Extm: ROM intact, No peripheral edema Skin: Dry, Warm, normal turgor.  Neuro: AAOx3   Assessment/Plan:  Active Problems:   Atrial fibrillation with rapid ventricular response (HCC)  [name] is an 86 yo F w/ PMH of CAD s/p DESx2, HTN, HLD and DM admit for A.Fib w/ RVR likely 2/2 COVID infection  Acute Diarrhea Incontinence associated dermatitis Cutaneous candidiasis Informed had episode of profuse watery diarrhea overnight. Likely due to COVID-enteritis. No hx of recent abx. C.diff screen negative. Bp stable. Electrolytes wnl. While cleaning, noted to have  - Imodium prn - Appreciate wound-care consult - fluconazole 150mg  for 3 days  New Atrial Fibrillation with Rapid Ventricular Response (resolved) No prior hx of A.fib. No atrial structural abnormalities on most recent Echo (04/2018). Likely driven by COVID infection and profound dehydration. Converted back to sinus on hospital day 1. CHADSVasc2 score of 5 due to age/gender/MI/DM. Rate 60s this am - Resolved - Will switch to  metoprolol succinate for easier dosing - C/w eliquis 2.5mg  BID (renal dosing)  COVID infection Found to be COVID +. Minimal oxygen requirement. Inflammatory markers fluctuating (CRP 4.1->5.7->5.4, D-dimer 1.06->1.22->1.49. Concerning for alternative sources of infection but currently denies any focal symptoms. On treatment day 3. LFTs stable. With non-specific malaise and myalgia, appear deconditioned. Need PT/OT eval to guide dispo - C/w remdesivir as hospitalized patient (day 3/5) - Trend LFTS, inflammatory markers - C/w isolations - F/u PT, OT  Acute Kidney Injury on Chronic Kidney Disease stage 3b Baseline Bun 25, Creatinine 1.42. Creatinine trend improving w/ fluids (1.72->1.52->1.45). Hyperkalemia on admission resolved K 5.7->4->3.8 - Trend renal fx - Avoid nephrotoxic meds  DVT prophx: eliquis Diet: CM Bowel: Miralax Code: Full  Prior to Admission Living Arrangement: Home Anticipated Discharge Location: Home vs SNF Barriers to Discharge: Medical treatment Dispo: Anticipated discharge in approximately 0-1 day(s).   97, MD 12/14/2019, 6:49 AM Pager: [phone] After 5pm on weekdays and 1pm on weekends: On Call Pager: [phone]

## 2019-12-15 ENCOUNTER — Ambulatory Visit (HOSPITAL_COMMUNITY)
Admit: 2019-12-15 | Discharge: 2019-12-15 | Disposition: A | Discharge status: Home / Self Care | Payer: Medicare Other | Source: Ambulatory Visit | Attending: Pulmonary Disease | Admitting: Pulmonary Disease

## 2019-12-15 DIAGNOSIS — U071 COVID-19: Secondary | ICD-10-CM | POA: Insufficient documentation

## 2019-12-15 DIAGNOSIS — J1289 Other viral pneumonia: Secondary | ICD-10-CM | POA: Diagnosis not present

## 2019-12-15 MED ORDER — SODIUM CHLORIDE 0.9 % IV SOLN: INTRAVENOUS | Status: DC | PRN

## 2019-12-15 MED ORDER — METHYLPREDNISOLONE SODIUM SUCC 125 MG IJ SOLR: 125.0000 mg | Freq: Once | INTRAMUSCULAR | Status: DC | PRN

## 2019-12-15 MED ORDER — DIPHENHYDRAMINE HCL 50 MG/ML IJ SOLN: 50.0000 mg | Freq: Once | INTRAMUSCULAR | Status: DC | PRN

## 2019-12-15 MED ORDER — SODIUM CHLORIDE 0.9 % IV SOLN
100.0000 mg | Freq: Once | INTRAVENOUS | Status: AC
  Administered 2019-12-15: 100 mg via INTRAVENOUS
  Filled 2019-12-15: qty 20

## 2019-12-15 MED ORDER — ALBUTEROL SULFATE HFA 108 (90 BASE) MCG/ACT IN AERS: 2.0000 | INHALATION_SPRAY | Freq: Once | RESPIRATORY_TRACT | Status: DC | PRN

## 2019-12-15 MED ORDER — EPINEPHRINE 0.3 MG/0.3ML IJ SOAJ: 0.3000 mg | Freq: Once | INTRAMUSCULAR | Status: DC | PRN

## 2019-12-15 MED ORDER — FAMOTIDINE IN NACL 20-0.9 MG/50ML-% IV SOLN: 20.0000 mg | Freq: Once | INTRAVENOUS | Status: DC | PRN

## 2019-12-15 NOTE — Discharge Instructions (Signed)

## 2019-12-15 NOTE — Progress Notes (Signed)
  Diagnosis: COVID-19  Physician: Dr. Wright   Procedure: [facility] Med: remdesivir infusion - Provided patient with remdesivir fact sheet for patients, parents and caregivers prior to infusion.  Complications: No immediate complications noted.  Discharge: Discharged home   Mercede Rollo  Bell 12/15/2019   

## 2019-12-16 ENCOUNTER — Ambulatory Visit (HOSPITAL_COMMUNITY)
Admit: 2019-12-16 | Discharge: 2019-12-16 | Disposition: A | Discharge status: Home / Self Care | Payer: Medicare Other | Attending: Pulmonary Disease | Admitting: Pulmonary Disease

## 2019-12-16 DIAGNOSIS — U071 COVID-19: Secondary | ICD-10-CM | POA: Diagnosis present

## 2019-12-16 DIAGNOSIS — J1282 Pneumonia due to coronavirus disease 2019: Secondary | ICD-10-CM | POA: Insufficient documentation

## 2019-12-16 MED ORDER — DIPHENHYDRAMINE HCL 50 MG/ML IJ SOLN: 50.0000 mg | Freq: Once | INTRAMUSCULAR | Status: DC | PRN

## 2019-12-16 MED ORDER — METHYLPREDNISOLONE SODIUM SUCC 125 MG IJ SOLR: 125.0000 mg | Freq: Once | INTRAMUSCULAR | Status: DC | PRN

## 2019-12-16 MED ORDER — ALBUTEROL SULFATE HFA 108 (90 BASE) MCG/ACT IN AERS: 2.0000 | INHALATION_SPRAY | Freq: Once | RESPIRATORY_TRACT | Status: DC | PRN

## 2019-12-16 MED ORDER — SODIUM CHLORIDE 0.9 % IV SOLN
100.0000 mg | Freq: Once | INTRAVENOUS | Status: AC
  Administered 2019-12-16: 100 mg via INTRAVENOUS
  Filled 2019-12-16: qty 20

## 2019-12-16 MED ORDER — EPINEPHRINE 0.3 MG/0.3ML IJ SOAJ: 0.3000 mg | Freq: Once | INTRAMUSCULAR | Status: DC | PRN

## 2019-12-16 MED ORDER — FAMOTIDINE IN NACL 20-0.9 MG/50ML-% IV SOLN: 20.0000 mg | Freq: Once | INTRAVENOUS | Status: DC | PRN

## 2019-12-16 MED ORDER — SODIUM CHLORIDE 0.9 % IV SOLN: INTRAVENOUS | Status: DC | PRN

## 2019-12-16 NOTE — Progress Notes (Signed)
  Diagnosis: COVID-19  Physician:Dr wright  Procedure: [facility] Infusion Clinic Med: remdesivir infusion - Provided patient with remdesivir fact sheet for patients, parents and caregivers prior to infusion.  Complications: No immediate complications noted.  Discharge: Discharged home   [name] 12/16/2019

## 2019-12-17 LAB — CULTURE, BLOOD (ROUTINE X 2)
Culture: NO GROWTH
Culture: NO GROWTH
Special Requests: ADEQUATE

## 2020-01-04 ENCOUNTER — Telehealth: Payer: Self-pay | Admitting: Cardiovascular Disease

## 2020-01-04 NOTE — Telephone Encounter (Signed)
While recovering from Covid, pt was hospitalized for a week -dc'd on Sunday after  "a small intestinal bleed that they sealed" and is staying w her daughter Darl Pikes.    Has had consistently low BPs.  New afib noted during most recently hospitalization.  Pre syncopal episodes at hospital x 3. At home becomes weak and "goes down" does not lose consciousness.  Mobility is an issue.  PT has started. Extremely weak.  She is able to transfer/ambulate w assist x2.  Hgb was 8.2 on 9/5. She is not taking Toprol XL 100 or clopidogrel.  She is on amiodraone 200 mg daily, asa 81 and Eliquis 2.5 mg BID.  Drinking fluids.  Recheck of of BP 111/59 around 9 am.  Will continue to monitor this and HR. Adv I would let Dr. Clifton James know and that she should call back w further concerns. Adv to reach out to PCP for possible virtual follow up. Nurse may be able to check labs at home to monitor CBC. Adv as hgb improves and she begins to work w PT she should get stronger.   Her follow up in cardiology is scheduled for 04/13/20.

## 2020-01-04 NOTE — Telephone Encounter (Signed)
Pt c/o BP issue: STAT if pt c/o blurred vision, one-sided weakness or slurred speech  1. What are your last 5 BP readings? 107/45 HR 70 this morning  2. Are you having any other symptoms (ex. Dizziness, headache, blurred vision, passed out)? dizziness  3. What is your BP issue? Patient's daughter states the patient's BP has been dropping. She states she is 26 days post covid and has been in the hospital.

## 2020-01-05 NOTE — Telephone Encounter (Signed)
Agree with your statements. Thanks, chris

## 2020-01-19 ENCOUNTER — Other Ambulatory Visit: Payer: Self-pay | Admitting: Internal Medicine

## 2020-01-20 ENCOUNTER — Other Ambulatory Visit: Payer: Self-pay | Admitting: Internal Medicine

## 2020-01-24 ENCOUNTER — Telehealth: Payer: Self-pay | Admitting: Cardiovascular Disease

## 2020-01-24 NOTE — Telephone Encounter (Signed)
If her H/H is stable and there is no further bleeding it should be safe to restart the Eliquis but I would wait until Lawson Fiscal sees her tomorrow to do that so we can make sure there are no other issues. Thayer Ohm

## 2020-01-24 NOTE — Telephone Encounter (Signed)
[name] had covid and then afib and a GI bleed. Spoke with patient's daugher who reports [name]'s PCP Marylene Land, PA-C came to their house on Saturday for a visit.  He adv that her hgb has improved to 10.6.  He did not refill her Eliquis, thinks she should be seen by cardiology first.  She took last dose Friday night so was without it this weekend.  Continues amiodarone.  Seeing APP tomorrow.   Aware I am forwarding to Dr. Clifton James and Lawson Fiscal and will call back if we will be refilling Eliquis before her appointment tomorrow.   Has not had f/u w GI since leaving hospital.

## 2020-01-24 NOTE — Telephone Encounter (Signed)
New Message:     Daughter called and said pt saw her primary doctor on Saturday. She was told to get an appointment this week with her Cardiologist. Pt was in Atrial Fib and concerned about pt taking Eliquis and Amiodarone. I made an an appointment with Deanna Artis for tomorrow. Please call to evaluate and see if pt needs to be seen sooner.

## 2020-01-24 NOTE — Progress Notes (Signed)
CARDIOLOGY OFFICE NOTE  Date:  01/25/2020    [name] Date of Birth: Mar 13, 1934 Medical Record #[id number]  PCP:  Fransisca Connors, PA-C  Cardiologist:  [facility]  Chief Complaint  Patient presents with  . Atrial Fibrillation    History of Present Illness: [name] is a 86 y.o. female who presents today for a work in visit. Seen for Dr. Angelena Form.   She has a history of known CAD, HTN, HLD, neuropathy and DM. She had a Taxus drug eluting stent placed in the LAD in 2006, PCI/stent to the [facility] in 2015, residual disease with moderate RCA disease, mild LAD disease and moderate to severe small caliber diagonal disease. Echo January 2020 with LVEF=55-60%. No valve disease.   Last seen in November of 2020 by Dr. Angelena Form - felt to be doing ok.  Thus added to my schedule for today for new AF.   Comes in today. Here with her daughter.  Her daughter augments the entire history. Was basically doing fine in July. In mid August had COVID - got Remdesivir - actually had presented with a fall. Had been on Plavix. She did have AF noted during that admission and Eliquis was started.  Then several ER visits for "passing out" - this history is not quite clear. Now having to live with her daughter for the past few weeks in [place] but had been very independent at home with her 48 year old husband - then had the perforated colon on August 28th and had bleeding. Had AF during that time as well. Eliquis was continued and Amiodarone was started. Apparently blood count was low during that admission - left with HGB at 9.1 - she is now 10.6 as of this past Saturday - she stopped her Eliquis on Friday. PCP saw her for a home visit on Saturday and did not refill. No active bleeding now.  Maybe a touch of blood on a tissue but nothing active. BUN 19 and Creatinine 1.3. Potassium 5 and liver tests are normal.   The patient's only complaint is that of having trouble walking. Having trouble  with her entire left side since she had COVID. CT head was done and reportedly ok - but for MRI tomorrow thru PCP. She drags her left foot. She cannot raise her left arm above her head. She has had a fall in the bathroom (wouldn't wait on family) - fortunately, no injury.   Past Medical History:  Diagnosis Date  . Anemia   . Anemia, iron deficiency   . CAD (coronary artery disease)    status Taxus drug-eluding stent to the LAD December 2006  . Chronic renal disease    mild  . Dermatitis 11/23/2012  . Diabetes mellitus type II   . Diverticulosis of colon   . Edema    lower extremity with normal BNP  . Encounter for long-term (current) use of medications   . GERD (gastroesophageal reflux disease)   . Glossitis   . Hyperkalemia   . Hyperlipidemia   . Hypertension   . Leg pain, left   . Osteopenia   . Pain in foot    bilateral  . Polyneuropathy   . Pyuria   . Routine general medical examination at a health care facility   . UTI (urinary tract infection)     Past Surgical History:  Procedure Laterality Date  . CARDIOVASCULAR STRESS TEST  02/24/2006  . CHOLECYSTECTOMY    . ELECTROCARDIOGRAM  06/18/2006  .  ESOPHAGOGASTRODUODENOSCOPY  04/15/2006  . LEFT HEART CATHETERIZATION WITH CORONARY ANGIOGRAM N/A 12/14/2013   Procedure: LEFT HEART CATHETERIZATION WITH CORONARY ANGIOGRAM;  Surgeon: Peter M Martinique, MD;  Location: [facility] CATH LAB;  Service: Cardiovascular;  Laterality: N/A;  . PTCA     stent to LAD in 03/2005, recatheterization inJanuarry 2007 for recurrent chest pain  that showed a patent LAD stent with normal LV function  . TOTAL ABDOMINAL HYSTERECTOMY  1969   no BSO     Medications: Current Meds  Medication Sig  . aspirin 81 MG EC tablet Take 81 mg by mouth daily.  . Blood Glucose Monitoring Suppl (ONE TOUCH ULTRA MINI) W/DEVICE KIT Use to check blood sugar twice a day.  DX 250.00  . calcium carbonate (TUMS - DOSED IN MG ELEMENTAL CALCIUM) 500 MG chewable tablet Chew 1-2  tablets by mouth daily as needed for indigestion.   . fenofibrate 54 MG tablet Take 1 tablet (54 mg total) by mouth daily.  Marland Kitchen gabapentin (NEURONTIN) 100 MG capsule Take 1-3 capsules by mouth 3 (three) times daily as needed.  Marland Kitchen glipiZIDE (GLUCOTROL XL) 10 MG 24 hr tablet Take 10 mg by mouth daily before breakfast.  . glucose blood test strip ONE TOUCH ULTRA TEST STRIP. Use as instructed to check blood sugar twice a day.  Dx 250.00  . HUMALOG KWIKPEN 100 UNIT/ML KwikPen Inject into the skin.  Marland Kitchen insulin glargine (LANTUS SOLOSTAR) 100 UNIT/ML Solostar Pen Inject 20 Units into the skin at bedtime.   . insulin glargine (LANTUS) 100 UNIT/ML injection Inject 10 Units into the skin daily.  . Insulin Pen Needle 31G X 8 MM MISC 1 each by Does not apply route 2 (two) times daily before a meal.  . loperamide (IMODIUM) 2 MG capsule Take 1 capsule (2 mg total) by mouth as needed for diarrhea or loose stools.  . lovastatin (MEVACOR) 40 MG tablet Take 40 mg by mouth at bedtime.  . nitroGLYCERIN (NITROSTAT) 0.4 MG SL tablet Place 0.4 mg under the tongue every 5 (five) minutes as needed for chest pain.  Marland Kitchen PACERONE 200 MG tablet Take 200 mg by mouth daily.     Allergies: Allergies  Allergen Reactions  . Sulfa Antibiotics Hives and Rash  . Corticosteroids Other (See Comments)    Hyperglycemia  . Sulfonamide Derivatives Hives  . Pioglitazone Other (See Comments)    REACTION: Edema    Social History: The patient  reports that she has never smoked. She has never used smokeless tobacco. She reports that she does not drink alcohol and does not use drugs.   Family History: The patient's family history includes Heart disease in her mother and sister.   Review of Systems: Please see the history of present illness.   All other systems are reviewed and negative.   Physical Exam: VS:  BP (!) 142/68 Comment: per Levy Sjogren, CMA  Pulse 74   Ht 5' 7"  (1.702 m)   Wt 148 lb 9.6 oz (67.4 kg)   SpO2 99%    BMI 23.27 kg/m  .  BMI Body mass index is 23.27 kg/m.  Wt Readings from Last 3 Encounters:  01/25/20 148 lb 9.6 oz (67.4 kg)  12/13/19 156 lb 8.4 oz (71 kg)  12/09/19 150 lb (68 kg)    General: Pleasant. Elderly. Alert and in no acute distress. She looks a little pale. She is in a wheelchair.   Cardiac: Regular rate and rhythm. No murmurs, rubs, or gallops. Trace edema.  Respiratory:  Lungs are relatively clear to auscultation bilaterally with normal work of breathing.  GI: Soft and nontender.  MS: No deformity or atrophy. Gait not tested.  Skin: Warm and dry. Color is normal.  Neuro:  Clearly with left sided weakness noted. She has a mouth droop. She cannot lift up her left foot. Strength is less on the left side.  Psych: Alert, appropriate and with normal affect.   LABORATORY DATA:  EKG:  EKG is ordered today.  Personally reviewed by me. This demonstrates NSR today - HR is 74.  Lab Results  Component Value Date   WBC 2.6 (L) 12/14/2019   HGB 12.2 12/14/2019   HCT 37.2 12/14/2019   PLT 156 12/14/2019   GLUCOSE 80 12/14/2019   CHOL 159 02/01/2013   TRIG 135 12/12/2019   HDL 47 02/01/2013   LDLDIRECT 56.2 01/16/2009   LDLCALC 86 02/01/2013   ALT 27 12/14/2019   AST 57 (H) 12/14/2019   NA 139 12/14/2019   K 3.8 12/14/2019   CL 106 12/14/2019   CREATININE 1.45 (H) 12/14/2019   BUN 24 (H) 12/14/2019   CO2 21 (L) 12/14/2019   TSH 0.501 12/12/2019   INR 1.03 12/14/2013   HGBA1C 7.1 (H) 12/13/2013   MICROALBUR 1.20 05/25/2012     BNP (last 3 results) No results for input(s): BNP in the last 8760 hours.  ProBNP (last 3 results) No results for input(s): PROBNP in the last 8760 hours.   Other Studies Reviewed Today:  ECHO IMPRESSIONS 04/2018  1. The left ventricle has normal size and 55-60% ejection fraction.  2. The right ventricle has normal size and normal systolic function.  3. Normal left atrial size.  4. Normal right atrial size.  5. Normal tricuspid  valve.  6. Aortic valve normal.  7. The mitral valve is normal in structure and function.  8. No atrial level shunt detected by color flow Doppler.   Assessment/Plan:  1. New onset AF - first noted when she had COVID - had recurrence with her colon perforation. CHADSVASC is at least 8. There is no active bleeding. She looks to have already had a stroke - will restart Eliquis - she actually needs to be on the 5 mg dose - her creatinine is 1.3 and she is > 60kg. This will be challenging given her overall situation - has already fallen.   2. Recent COVID 19  3. CAD with prior PCIs - off Plavix - remains on baby aspirin and now on Eliquis - no chest pain. Would favor conservative management.   4. HTN - BP is fair - would follow for now.   5. HLD  6. DM  7. Recent blood loss anemia - now with HGB up to 10.6 - very difficult situation - will restart Eliquis today - 5 mg BID - will need close follow up going forward.   8. Suspected stroke - getting MRI tomorrow - would assume this will lead to neurology referral - will defer to PCP.   Current medicines are reviewed with the patient today.  The patient does not have concerns regarding medicines other than what has been noted above.  The following changes have been made:  See above.  Labs/ tests ordered today include:    Orders Placed This Encounter  Procedures  . EKG 12-Lead     Disposition:   FU with me in a few weeks. Eliquis is restarted today at 5 mg BID. Will need close follow up. Overall, very sad  set of circumstances.    Patient is agreeable to this plan and will call if any problems develop in the interim.   SignedTruitt Merle, NP  01/25/2020 4:41 PM  [facility] [address] [place], [place]  [address] Phone: [phone] Fax: [phone]

## 2020-01-25 ENCOUNTER — Encounter: Payer: Self-pay | Admitting: Nurse Practitioner

## 2020-01-25 ENCOUNTER — Ambulatory Visit: Payer: Medicare Other | Admitting: Nurse Practitioner

## 2020-01-25 ENCOUNTER — Other Ambulatory Visit: Payer: Self-pay

## 2020-01-25 VITALS — BP 142/68 | HR 74 | Ht 67.0 in | Wt 148.6 lb

## 2020-01-25 DIAGNOSIS — I480 Paroxysmal atrial fibrillation: Secondary | ICD-10-CM

## 2020-01-25 DIAGNOSIS — Z7901 Long term (current) use of anticoagulants: Secondary | ICD-10-CM

## 2020-01-25 DIAGNOSIS — E7800 Pure hypercholesterolemia, unspecified: Secondary | ICD-10-CM

## 2020-01-25 DIAGNOSIS — Z79899 Other long term (current) drug therapy: Secondary | ICD-10-CM

## 2020-01-25 DIAGNOSIS — I10 Essential (primary) hypertension: Secondary | ICD-10-CM

## 2020-01-25 MED ORDER — APIXABAN 5 MG PO TABS: 5.0000 mg | ORAL_TABLET | Freq: Two times a day (BID) | ORAL | 6 refills | Status: AC

## 2020-01-25 NOTE — Patient Instructions (Addendum)
After Visit Summary:  We will be checking the following labs today - NONE   Medication Instructions:    Continue with your current medicines. BUT  We are starting back Eliquis 5 mg to take twice a day - I sent this to the pharmacy   If you need a refill on your cardiac medications before your next appointment, please call your pharmacy.     Testing/Procedures To Be Arranged:  N/A  Follow-Up:   See me in about 2 to 3 weeks    At [facility], you and your health needs are our priority.  As part of our continuing mission to provide you with exceptional heart care, we have created designated Provider Care Teams.  These Care Teams include your primary Cardiologist (physician) and Advanced Practice Providers (APPs -  Physician Assistants and Nurse Practitioners) who all work together to provide you with the care you need, when you need it.  Special Instructions:  . Stay safe, wash your hands for at least 20 seconds and wear a mask when needed.  . It was good to talk with you today.    Call the [facility] office at [phone] if you have any questions, problems or concerns.

## 2020-01-27 ENCOUNTER — Telehealth: Payer: Self-pay | Admitting: Cardiovascular Disease

## 2020-01-27 NOTE — Telephone Encounter (Signed)
I did also recommend increasing salt.  They give her boost throughout the morning because of the sodium.

## 2020-01-27 NOTE — Telephone Encounter (Signed)
I spoke with both daughters Darl Pikes and Amil Amen with patient's verbal permission. They forgot when she came to the appointment w Lawson Fiscal on Tuesday to discuss patient's BP. It drops almost every day while she is sitting on the toilet.  They have to lay her on the floor before they are able to get her back up.  She sits there about 5-10 minutes.  This am BP dropped to 84/48 before recovering to 126/59.  I encouraged increase in fluids which they are trying to do.  I suggested compression stockings but yesterday she had 2 wounds debrided on her heels.    No on any BP lowering medications.  Sees Lori back on 02/15/20.   They are aware I am forwarding to [facility] and Dr. Clifton James.  Will back with any new recommendations.

## 2020-01-27 NOTE — Telephone Encounter (Signed)
Thanks

## 2020-01-27 NOTE — Telephone Encounter (Signed)
She is not on any anti hypertensives that I see - would agree with compression stockings as possible.  Ok to liberalize her salt some as well.   Lawson Fiscal

## 2020-01-27 NOTE — Telephone Encounter (Signed)
New Message:   Pt saw Dr Clifton James on Tuesday. They forgot to address pt's Orthostatic Hypertension. Pt keep passing out or almost passing out.. This happen when pt goes from sitting to standing or laying to standing. She said they need some guidance on what to do please.

## 2020-01-28 NOTE — Telephone Encounter (Signed)
Left the pt a message to call the office back and ask to speak with after hours if needed, or call back during normal business hours if non-emergent.  Will forward this message to Dr. Gibson Ramp primary covering RN for further follow-up.

## 2020-01-28 NOTE — Telephone Encounter (Signed)
Needs to speak with a doctor or nurse asap about patient. Please call

## 2020-02-01 ENCOUNTER — Telehealth: Payer: Self-pay | Admitting: Nurse Practitioner

## 2020-02-01 NOTE — Telephone Encounter (Signed)
Patient's daughter calling to find out if there are any labs that are needed. She states the patient's PCP can run whatever is needed.

## 2020-02-02 NOTE — Telephone Encounter (Signed)
S/w daughter per (DPR).  Explained does not need any labs from [facility] till pt  Comes back for upcoming ov.  Daughter was concerned about pt being orthostatic but forgot to mention at last ov.  Daughter stated  She has a routine with pt and this has gotten better. Stated can check orthostatics when pt come backs will put in appt notes.  Will send to [facility] to [place].

## 2020-02-03 ENCOUNTER — Ambulatory Visit: Payer: Medicare Other | Admitting: Podiatry

## 2020-02-07 NOTE — Progress Notes (Signed)
CARDIOLOGY OFFICE NOTE  Date:  02/15/2020    [name] Date of Birth: 1933/09/17 Medical Record #[id number]  PCP:  Fransisca Connors, PA-C  Cardiologist:  [facility]    Chief Complaint  Patient presents with  . Follow-up    Seen for Dr. Angelena Form    History of Present Illness: [name] is a 86 y.o. female who presents today for a follow up visit. Seen for Dr. Angelena Form.   She has a history of known CAD, HTN, HLD, neuropathy and DM. She had a Taxus drug eluting stent placed in the LAD in 2006, PCI/stent to the [facility] in 2015, residual disease with moderate RCA disease, mild LAD disease and moderate to severe small caliber diagonal disease. Echo January 2020 with LVEF=55-60%. No valve disease.   Last seen in November of 2020 by Dr. Angelena Form - felt to be doing ok.  I saw her late last month as an add on for new AF - Was basically doing fine in July. In mid August had COVID - got Remdesivir - actually had presented with a fall. Had been on Plavix. She did have AF noted during that admission and Eliquis was started.  Then several ER visits for "passing out" - this history is not quite clear. Now having to live with her daughter for the past few weeks in [place] but had been very independent at home with her 24 year old husband - then had the perforated colon on August 28th and had bleeding. Had AF during that time as well. Eliquis was continued and Amiodarone was started. Apparently blood count was low during that admission - left with HGB at 9.1 - then up to 10.6. Was off Eliquis for a few days. No active bleeding.   She was not symptomatic with her AF - her only issue was trouble walking - she had left sided weakness, dragging her left foot - looked to have had a stroke - was getting ready to have MRI. Had already had one fall prior to our visit. We restarted her Eliquis - felt that this was going to be a very tenuous situation overall.   Comes in today. Here with  her daughter. Daughter augments the entire history. Doing ok from our standpoint. More issues now with pressure ulcers on both heels - has seen podiatry - has had debridement and getting wound care. Also going to the wound care clinic. Going to Vascular as well to check blood flow. This is limiting her ability to walk. Still getting PT and OT. MRI does show stroke. She is not able to bear weight - thus can't really walk at this time. No falls since last visit here. No bleeding. She is not dizzy. No real plan to see Neurology - she is on anticoagulation - on statin therapy - getting PT/OT - limited ability to walk due to her ulcers.   Past Medical History:  Diagnosis Date  . Anemia   . Anemia, iron deficiency   . CAD (coronary artery disease)    status Taxus drug-eluding stent to the LAD December 2006  . Chronic renal disease    mild  . Dermatitis 11/23/2012  . Diabetes mellitus type II   . Diverticulosis of colon   . Edema    lower extremity with normal BNP  . Encounter for long-term (current) use of medications   . GERD (gastroesophageal reflux disease)   . Glossitis   . Hyperkalemia   . Hyperlipidemia   .  Hypertension   . Leg pain, left   . Osteopenia   . Pain in foot    bilateral  . Polyneuropathy   . Pyuria   . Routine general medical examination at a health care facility   . UTI (urinary tract infection)     Past Surgical History:  Procedure Laterality Date  . CARDIOVASCULAR STRESS TEST  02/24/2006  . CHOLECYSTECTOMY    . ELECTROCARDIOGRAM  06/18/2006  . ESOPHAGOGASTRODUODENOSCOPY  04/15/2006  . LEFT HEART CATHETERIZATION WITH CORONARY ANGIOGRAM N/A 12/14/2013   Procedure: LEFT HEART CATHETERIZATION WITH CORONARY ANGIOGRAM;  Surgeon: Peter M Martinique, MD;  Location: [facility] CATH LAB;  Service: Cardiovascular;  Laterality: N/A;  . PTCA     stent to LAD in 03/2005, recatheterization inJanuarry 2007 for recurrent chest pain  that showed a patent LAD stent with normal LV function  .  TOTAL ABDOMINAL HYSTERECTOMY  1969   no BSO     Medications: Current Meds  Medication Sig  . amiodarone (PACERONE) 200 MG tablet Take 200 mg by mouth daily.  Marland Kitchen apixaban (ELIQUIS) 5 MG TABS tablet Take 1 tablet (5 mg total) by mouth 2 (two) times daily.  Marland Kitchen aspirin 81 MG EC tablet Take 81 mg by mouth daily.  . Blood Glucose Monitoring Suppl (ONE TOUCH ULTRA MINI) W/DEVICE KIT Use to check blood sugar twice a day.  DX 250.00  . calcium carbonate (TUMS - DOSED IN MG ELEMENTAL CALCIUM) 500 MG chewable tablet Chew 1-2 tablets by mouth daily as needed for indigestion.   . fenofibrate 54 MG tablet Take 1 tablet (54 mg total) by mouth daily.  Marland Kitchen gabapentin (NEURONTIN) 100 MG capsule Take 1-3 capsules by mouth 3 (three) times daily as needed.  Marland Kitchen glipiZIDE (GLUCOTROL XL) 10 MG 24 hr tablet Take 10 mg by mouth daily before breakfast.  . glucose blood test strip ONE TOUCH ULTRA TEST STRIP. Use as instructed to check blood sugar twice a day.  Dx 250.00  . insulin glargine (LANTUS SOLOSTAR) 100 UNIT/ML Solostar Pen Inject 10 Units into the skin daily.  . Insulin Pen Needle 31G X 8 MM MISC 1 each by Does not apply route 2 (two) times daily before a meal.  . loperamide (IMODIUM) 2 MG capsule Take 1 capsule (2 mg total) by mouth as needed for diarrhea or loose stools.  . lovastatin (MEVACOR) 40 MG tablet Take 40 mg by mouth at bedtime.  . nitroGLYCERIN (NITROSTAT) 0.4 MG SL tablet Place 0.4 mg under the tongue every 5 (five) minutes as needed for chest pain.     Allergies: Allergies  Allergen Reactions  . Sulfa Antibiotics Hives and Rash  . Corticosteroids Other (See Comments)    Hyperglycemia  . Sulfonamide Derivatives Hives  . Pioglitazone Other (See Comments)    REACTION: Edema    Social History: The patient  reports that she has never smoked. She has never used smokeless tobacco. She reports that she does not drink alcohol and does not use drugs.   Family History: The patient's family  history includes Heart disease in her mother and sister.   Review of Systems: Please see the history of present illness.   All other systems are reviewed and negative.   Physical Exam: VS:  BP (!) 142/60   Pulse 64   SpO2 93%  .  BMI There is no height or weight on file to calculate BMI.  Wt Readings from Last 3 Encounters:  01/25/20 148 lb 9.6 oz (67.4 kg)  12/13/19  156 lb 8.4 oz (71 kg)  12/09/19 150 lb (68 kg)    General: Pleasant. Elderly. She is alert and in no acute distress. She remains pale. She is in a wheelchair.  Cardiac: Regular rate and rhythm. Both feet are in bandages.  Respiratory:  Lungs are clear to auscultation bilaterally with normal work of breathing.  GI: Soft and nontender.  MS: No deformity or atrophy. Gait not tested.  Skin: Warm and dry. Color is normal.  Neuro:  Strength and sensation are intact and no gross focal deficits noted but has left sided weakness - this looks a little better today.   Psych: Alert, appropriate and with normal affect.   LABORATORY DATA:  EKG:  EKG is not ordered today.    Lab Results  Component Value Date   WBC 2.6 (L) 12/14/2019   HGB 12.2 12/14/2019   HCT 37.2 12/14/2019   PLT 156 12/14/2019   GLUCOSE 80 12/14/2019   CHOL 159 02/01/2013   TRIG 135 12/12/2019   HDL 47 02/01/2013   LDLDIRECT 56.2 01/16/2009   LDLCALC 86 02/01/2013   ALT 27 12/14/2019   AST 57 (H) 12/14/2019   NA 139 12/14/2019   K 3.8 12/14/2019   CL 106 12/14/2019   CREATININE 1.45 (H) 12/14/2019   BUN 24 (H) 12/14/2019   CO2 21 (L) 12/14/2019   TSH 0.501 12/12/2019   INR 1.03 12/14/2013   HGBA1C 7.1 (H) 12/13/2013   MICROALBUR 1.20 05/25/2012     BNP (last 3 results) No results for input(s): BNP in the last 8760 hours.  ProBNP (last 3 results) No results for input(s): PROBNP in the last 8760 hours.   Other Studies Reviewed Today:  MRI HEAD Impression 01/2020 Performed by [id number] IMPRESSION:  1. Subacute infarct in the basal  ganglia on the right  2. More recent small infarct in the splenium of the corpus callosum just to the left of midline.    ECHO IMPRESSIONS 04/2018  1. The left ventricle has normal size and 55-60% ejection fraction.  2. The right ventricle has normal size and normal systolic function.  3. Normal left atrial size.  4. Normal right atrial size.  5. Normal tricuspid valve.  6. Aortic valve normal.  7. The mitral valve is normal in structure and function.  8. No atrial level shunt detected by color flow Doppler.   Assessment/Plan:  1. PAF - had first with COVID and then with her colon perforation - CHADSVASC of at least 8 - now on anticoagulation. Remains in sinus today by exam - remains on amiodarone - needs follow up lab today.   2. Chronic anticoagulation - no falls since our last visit - rechecking her CBC today.   3. Prior COVID 19  4. Prior stroke - MRI confirms - most likely due to the AF. They do not feel they need to see Neuro - already on blood thinner - on statin - getting PT/OT.   5. CAD - prior PCIs - off Plavix - on baby aspirin and Eliquis - she has no worrisome symptoms - would favor continued medical management.   6. HTN - BP fair - would follow.  No changes made today.   7. HLD - on statin therapy  8. Prior blood loss anemia - now on anticoagulation - rechecking lab today.   9. Bilateral heel ulcers - seeing Vascular soon - managed in the wound clinic.   Current medicines are reviewed with the patient today.  The patient  does not have concerns regarding medicines other than what has been noted above.  The following changes have been made:  See above.  Labs/ tests ordered today include:    Orders Placed This Encounter  Procedures  . Basic metabolic panel  . CBC  . TSH  . Hepatic function panel     Disposition:   FU with Dr. Angelena Form as planned per recall next month. Lab today. Samples of Eliquis.    Patient is agreeable to this plan and will  call if any problems develop in the interim.   SignedTruitt Merle, NP  02/15/2020 12:00 PM  [facility] [address] [place], [place]  [address] Phone: [phone] Fax: [phone]

## 2020-02-15 ENCOUNTER — Encounter: Payer: Self-pay | Admitting: Nurse Practitioner

## 2020-02-15 ENCOUNTER — Other Ambulatory Visit: Payer: Self-pay

## 2020-02-15 ENCOUNTER — Ambulatory Visit (INDEPENDENT_AMBULATORY_CARE_PROVIDER_SITE_OTHER): Payer: Medicare Other | Admitting: Nurse Practitioner

## 2020-02-15 ENCOUNTER — Ambulatory Visit: Payer: Medicare Other | Admitting: Physician Assistant

## 2020-02-15 VITALS — BP 142/60 | HR 64

## 2020-02-15 DIAGNOSIS — Z79899 Other long term (current) drug therapy: Secondary | ICD-10-CM | POA: Diagnosis not present

## 2020-02-15 DIAGNOSIS — I480 Paroxysmal atrial fibrillation: Secondary | ICD-10-CM

## 2020-02-15 DIAGNOSIS — I10 Essential (primary) hypertension: Secondary | ICD-10-CM | POA: Diagnosis not present

## 2020-02-15 DIAGNOSIS — Z7901 Long term (current) use of anticoagulants: Secondary | ICD-10-CM | POA: Diagnosis not present

## 2020-02-15 DIAGNOSIS — E7800 Pure hypercholesterolemia, unspecified: Secondary | ICD-10-CM

## 2020-02-15 NOTE — Patient Instructions (Addendum)
After Visit Summary:  We will be checking the following labs today - BMET, HPF, TSH & CBC   Medication Instructions:    Continue with your current medicines.   Samples of Eliquis if needed   If you need a refill on your cardiac medications before your next appointment, please call your pharmacy.     Testing/Procedures To Be Arranged:  N/A  Follow-Up:   See Dr. Clifton James in December per recall    At [facility], you and your health needs are our priority.  As part of our continuing mission to provide you with exceptional heart care, we have created designated Provider Care Teams.  These Care Teams include your primary Cardiologist (physician) and Advanced Practice Providers (APPs -  Physician Assistants and Nurse Practitioners) who all work together to provide you with the care you need, when you need it.  Special Instructions:  . Stay safe, wash your hands for at least 20 seconds and wear a mask when needed.  . It was good to talk with you today.    Call the [facility] office at [phone] if you have any questions, problems or concerns.

## 2020-02-16 LAB — HEPATIC FUNCTION PANEL
ALT: 18 IU/L (ref 0–32)
AST: 26 IU/L (ref 0–40)
Albumin: 3.4 g/dL — ABNORMAL LOW (ref 3.6–4.6)
Alkaline Phosphatase: 65 IU/L (ref 44–121)
Bilirubin Total: <0.2 mg/dL (ref 0.0–1.2)
Bilirubin, Direct: 0.11 mg/dL (ref 0.00–0.40)
Total Protein: 6.3 g/dL (ref 6.0–8.5)

## 2020-02-16 LAB — BASIC METABOLIC PANEL
BUN/Creatinine Ratio: 17 (ref 12–28)
BUN: 24 mg/dL (ref 8–27)
CO2: 22 mmol/L (ref 20–29)
Calcium: 8.9 mg/dL (ref 8.7–10.3)
Chloride: 103 mmol/L (ref 96–106)
Creatinine, Ser: 1.43 mg/dL — ABNORMAL HIGH (ref 0.57–1.00)
GFR calc Af Amer: 39 mL/min/{1.73_m2} — ABNORMAL LOW (ref >59)
GFR calc non Af Amer: 34 mL/min/{1.73_m2} — ABNORMAL LOW (ref >59)
Glucose: 159 mg/dL — ABNORMAL HIGH (ref 65–99)
Potassium: 4.5 mmol/L (ref 3.5–5.2)
Sodium: 138 mmol/L (ref 134–144)

## 2020-02-16 LAB — CBC
Hematocrit: 32.7 % — ABNORMAL LOW (ref 34.0–46.6)
Hemoglobin: 10.5 g/dL — ABNORMAL LOW (ref 11.1–15.9)
MCH: 28.9 pg (ref 26.6–33.0)
MCHC: 32.1 g/dL (ref 31.5–35.7)
MCV: 90 fL (ref 79–97)
Platelets: 510 10*3/uL — ABNORMAL HIGH (ref 150–450)
RBC: 3.63 x10E6/uL — ABNORMAL LOW (ref 3.77–5.28)
RDW: 13.7 % (ref 11.7–15.4)
WBC: 9.9 10*3/uL (ref 3.4–10.8)

## 2020-02-16 LAB — TSH: TSH: 0.958 u[IU]/mL (ref 0.450–4.500)

## 2020-02-18 ENCOUNTER — Telehealth: Payer: Self-pay | Admitting: Cardiovascular Disease

## 2020-02-18 NOTE — Telephone Encounter (Signed)
This is not a common reaction to Eliquis, and is unlikely due to the medication. I would recommend that patient call her PCP or endocrinologist for advise on adjusting diabetes medications.

## 2020-02-18 NOTE — Telephone Encounter (Signed)
° ° °  Pt c/o medication issue:  1. Name of Medication:  apixaban (ELIQUIS) 5 MG TABS tablet   2. How are you currently taking this medication (dosage and times per day)? Take 1 tablet (5 mg total) by mouth 2 (two) times daily.  3. Are you having a reaction (difficulty breathing--STAT)?   4. What is your medication issue? Pt's daughter called, she said since pt started taking eliquis pt is having reaction with her blood sugar the lowest it got when she wakes up in the morning is 50. She said they tried giving pt's crackers, coke and even adjusting her insulin. They just wanted to know if this is a normal reaction for eliquis

## 2020-02-18 NOTE — Telephone Encounter (Signed)
Spoke with pt's daughter, Darl Pikes, [place] and advised low BS is unlikely related to Eliquis and she should contact PCP or Endocrinology.  Pt's daughter verbalizes understanding and states they have contacted Endocrinology as well.

## 2020-02-29 ENCOUNTER — Other Ambulatory Visit: Payer: Self-pay

## 2020-02-29 MED ORDER — AMIODARONE HCL 200 MG PO TABS: 200.0000 mg | ORAL_TABLET | Freq: Every day | ORAL | 3 refills | Status: AC

## 2020-04-13 ENCOUNTER — Ambulatory Visit: Payer: Medicare Other | Admitting: Cardiovascular Disease

## 2020-04-29 DEATH — deceased

## 2021-03-27 IMAGING — CR DG SHOULDER 2+V*L*
3 series · 3 of 3 positions shown · non-contrast
Comparison: None.

CLINICAL DATA: Shoulder pain after fall

EXAM:
LEFT SHOULDER - 2+ VIEW

[shoulder grashey]
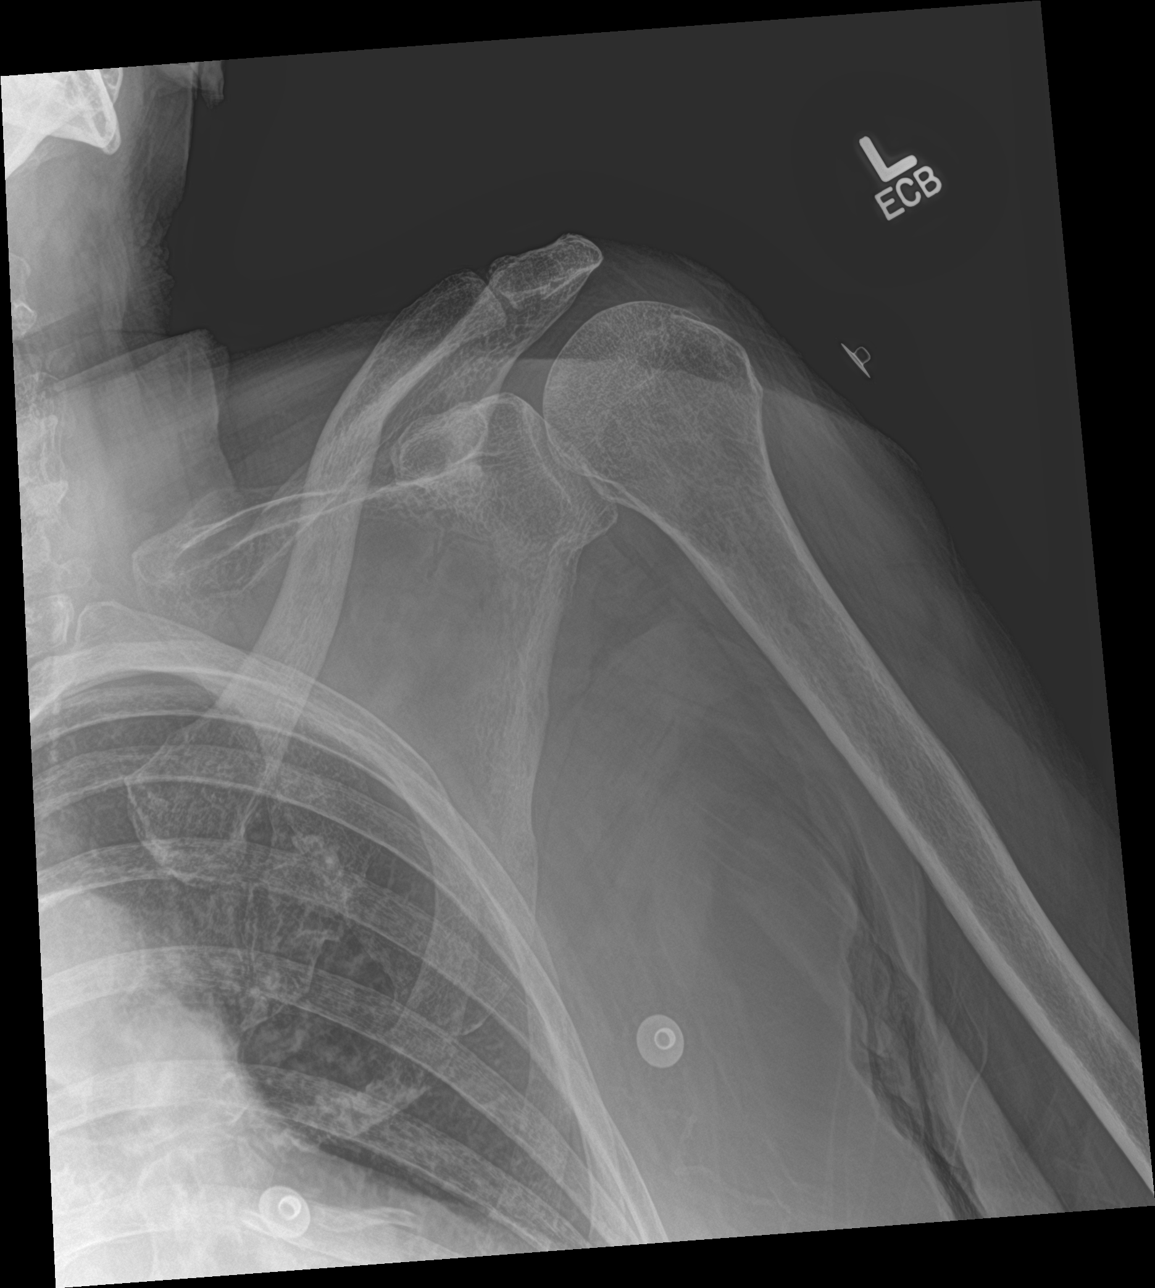

[shoulder y view]
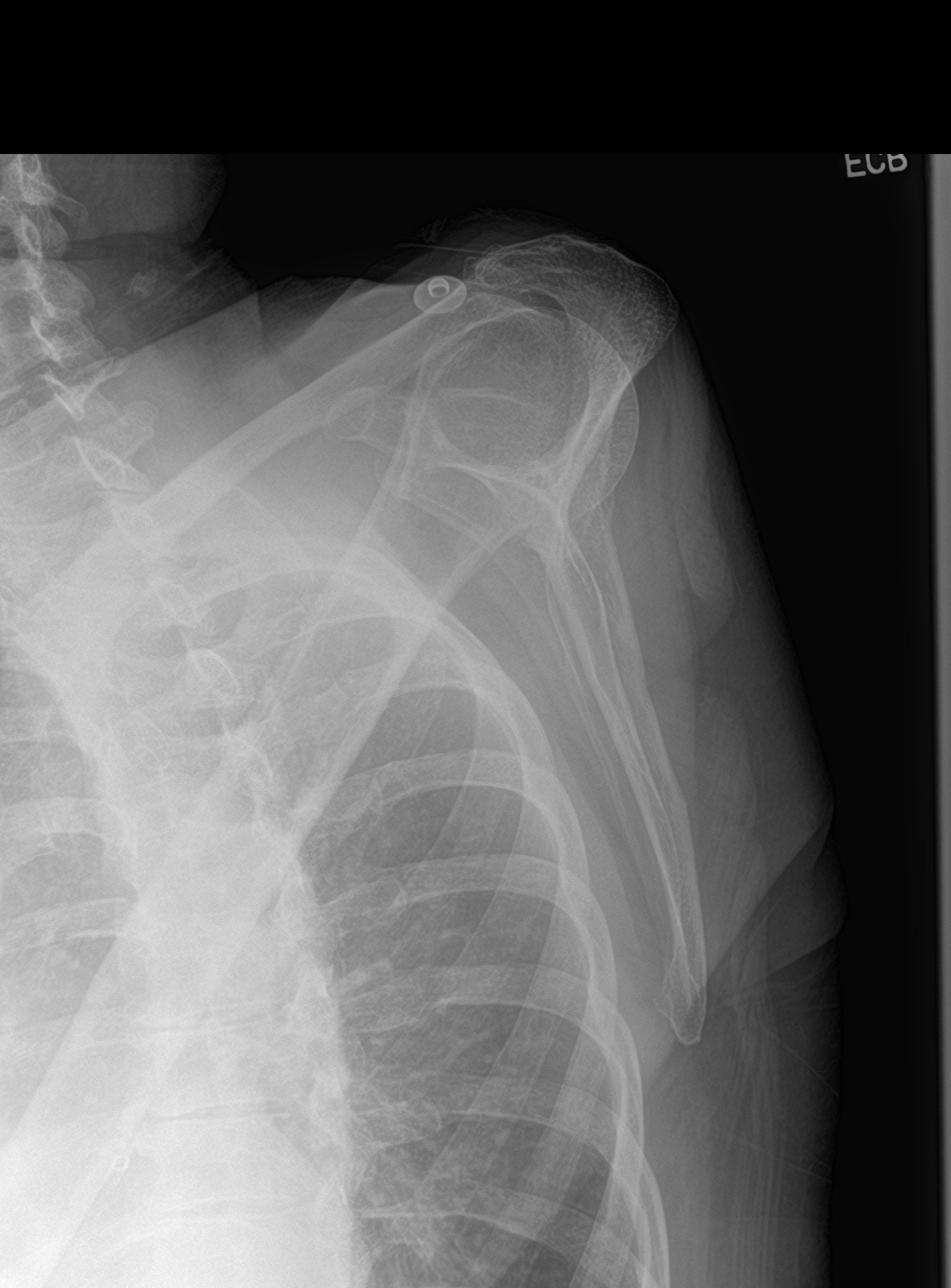

[shoulder ap neutral]
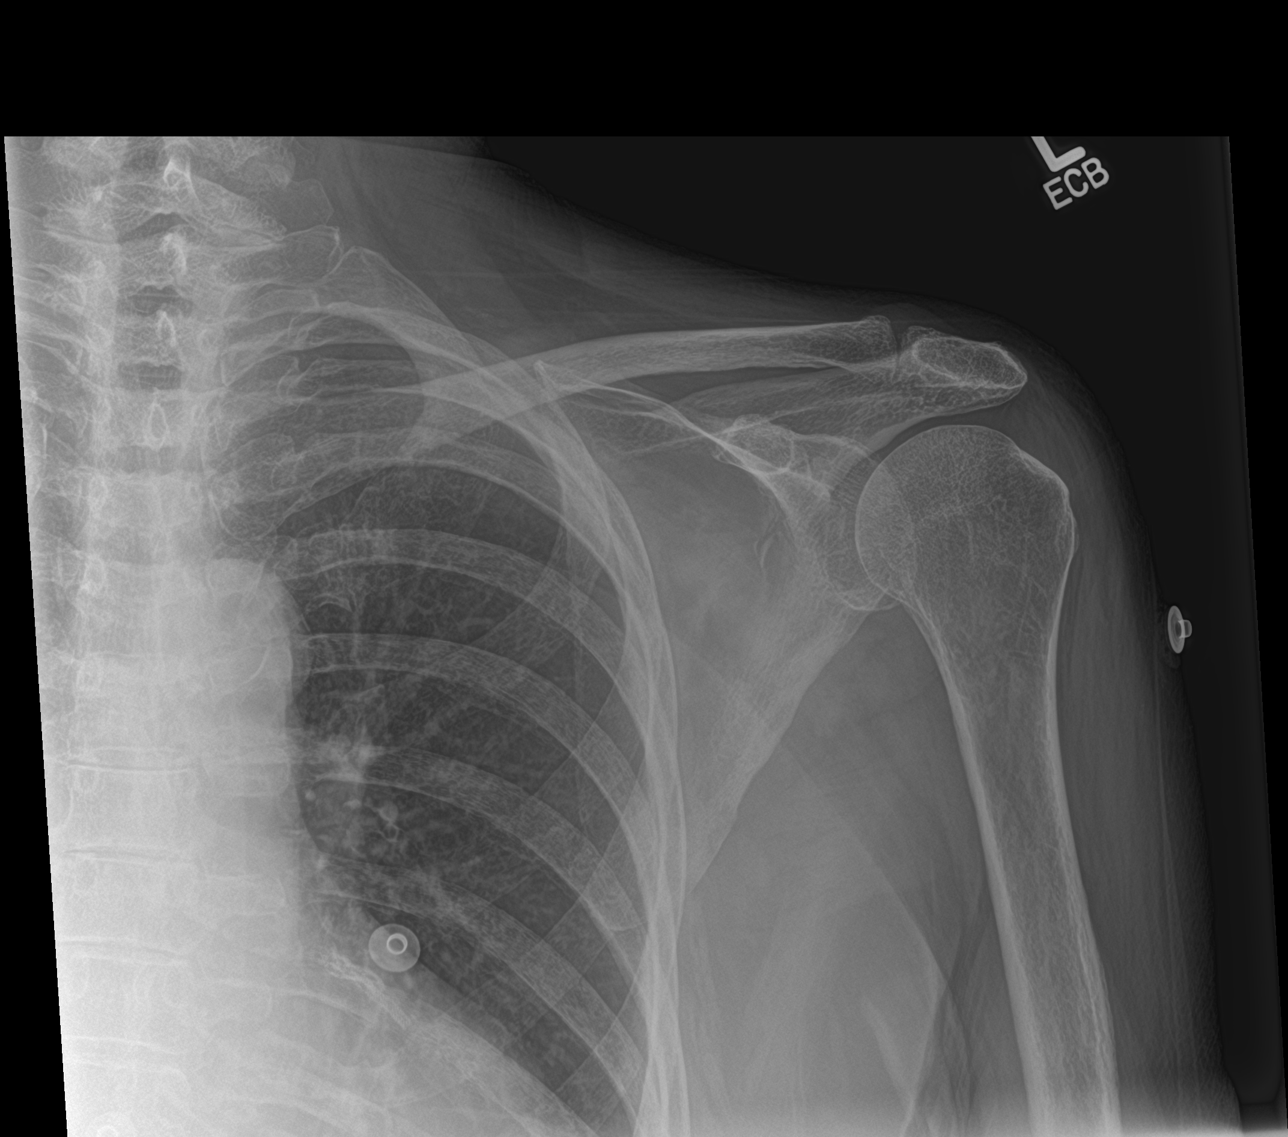

[3 of 3 positions shown; findings below may reference images not displayed]

FINDINGS: Mild irregular lucency at the scapular neck. AC joint is intact. No
fracture or malalignment at the glenohumeral interval.
IMPRESSION: Mild irregular lucency at the scapular neck is indeterminate for
nondisplaced fracture.

## 2021-03-27 IMAGING — CT CT HEAD W/O CM
4 series · 17 of 47 positions shown, 19 images · non-contrast
Comparison: CT head dated 03/11/2012.

CLINICAL DATA: Fall, on blood thinners.

EXAM:
CT HEAD WITHOUT CONTRAST
TECHNIQUE: Contiguous axial images were obtained from the base of the skull
through the vertex without intravenous contrast.

[Series 3: head wo · axial · 0.43mm/px · z∈[-130,-10]mm · 7 of 33 slices shown, 9 images]
[im 5/33  brain]
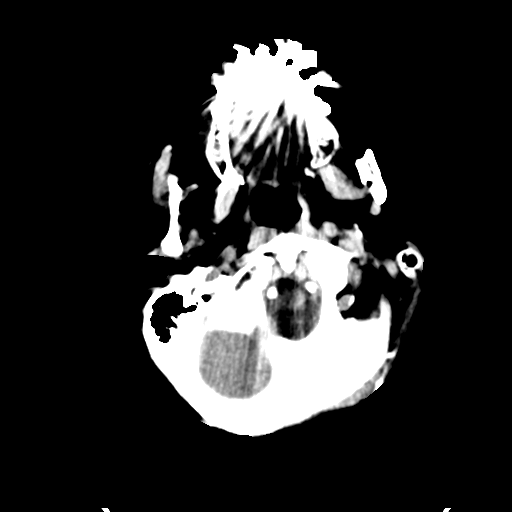
[im 5/33  bone]
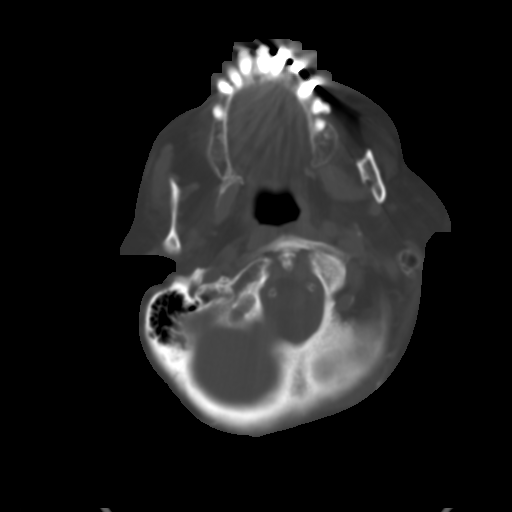
[im 9/33  brain]
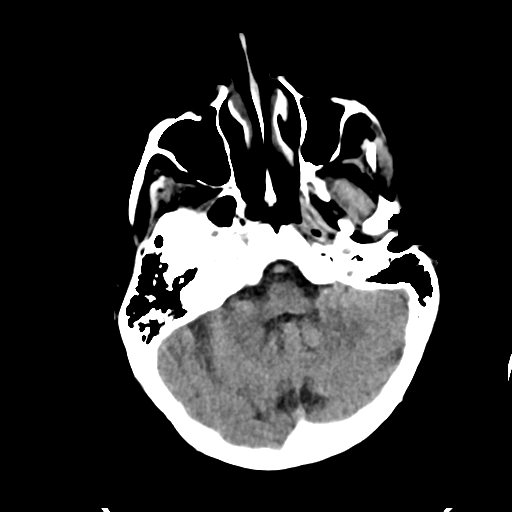
[im 13/33  brain]
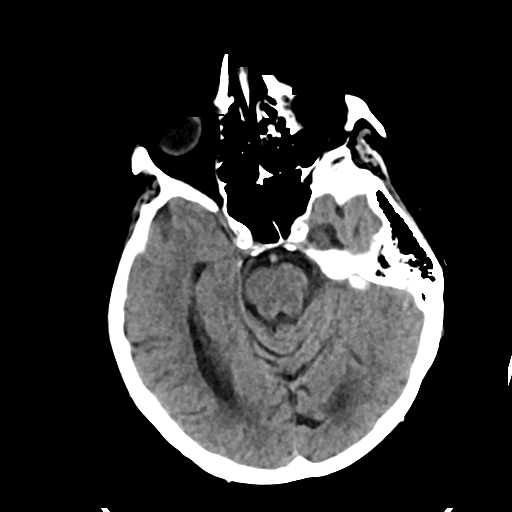
[im 17/33  brain]
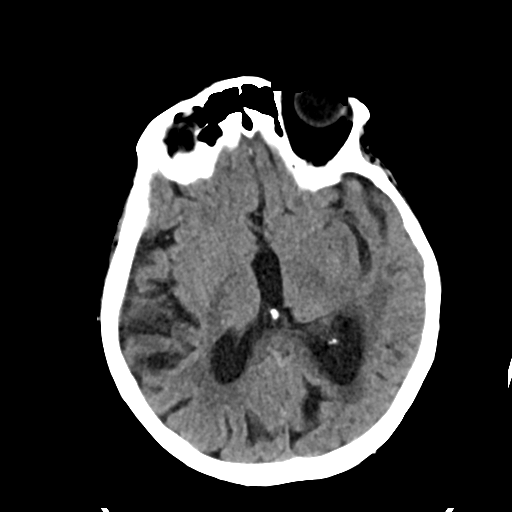
[im 21/33  brain]
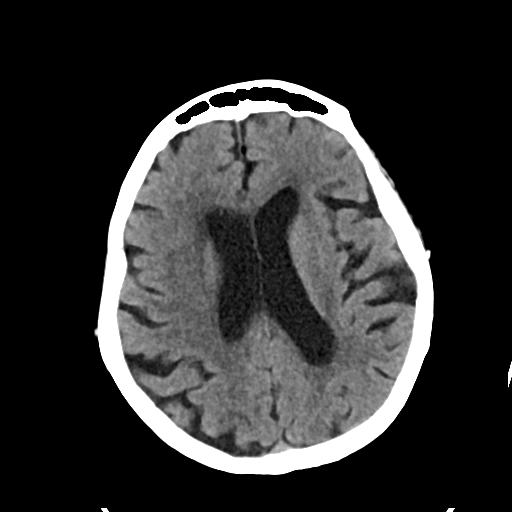
[im 21/33  bone]
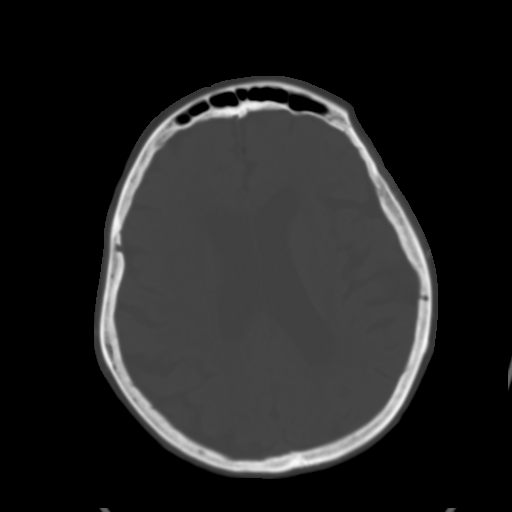
[im 25/33  brain]
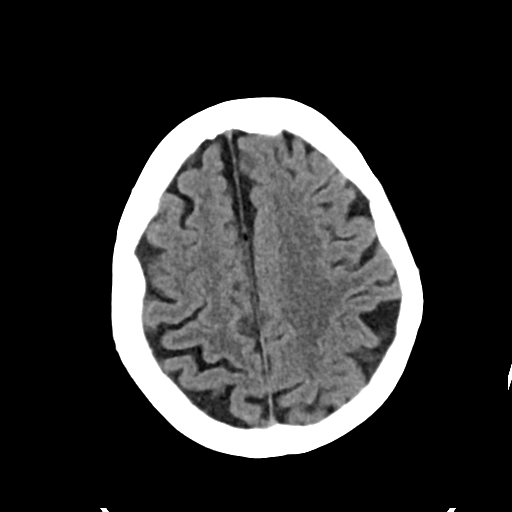
[im 29/33  brain]
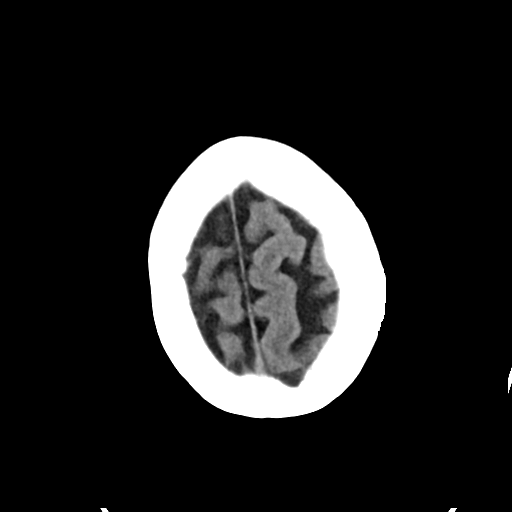

[Series 4: head bone · axial · 0.43mm/px · z∈[-134,-78]mm · 4 of 82 slices shown]
[im 9/82  bone]
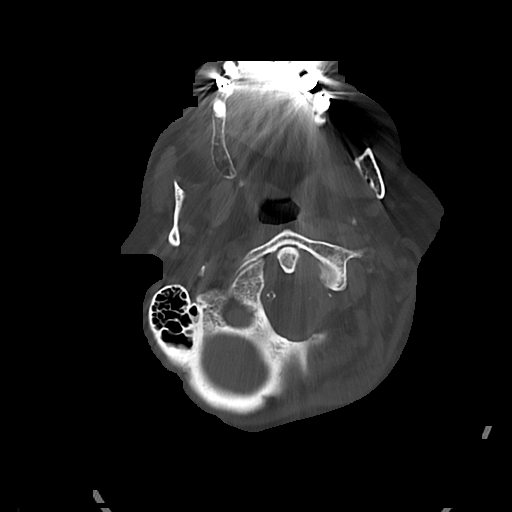
[im 17/82  bone]
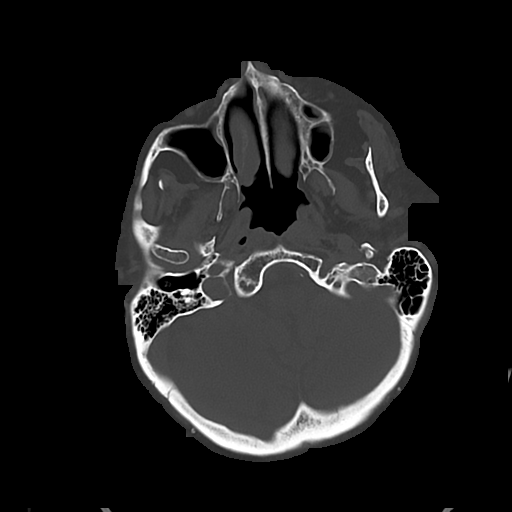
[im 25/82  bone]
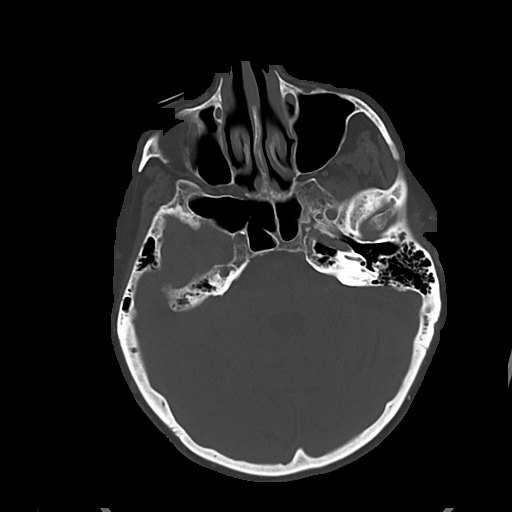
[im 37/82  bone]
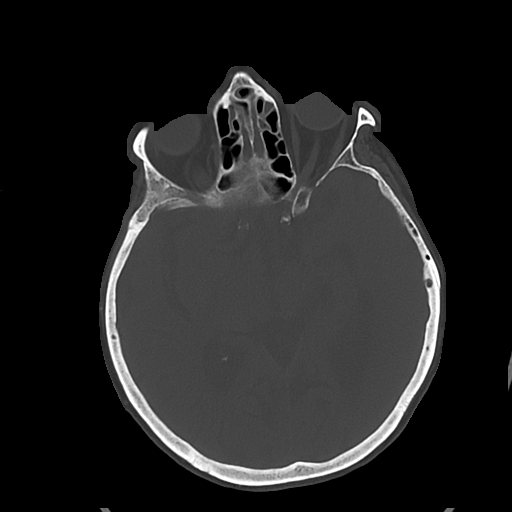

[Series 5: cor soft · coronal · 0.35mm/px · 3 of 71 slices shown]
[im 24/71  brain]
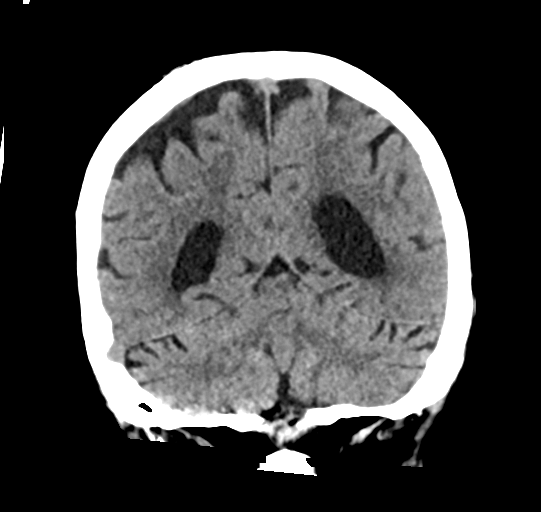
[im 32/71  brain]
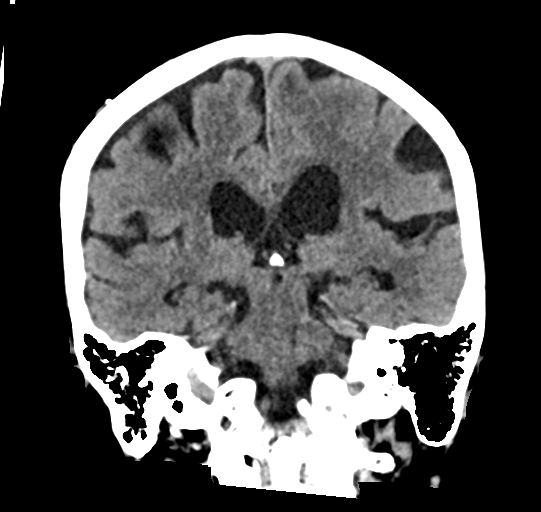
[im 39/71  brain]
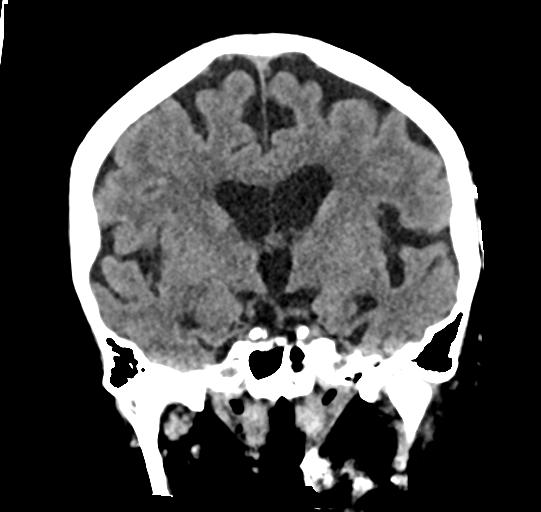

[Series 6: sag soft · sagittal · 0.35mm/px · 3 of 64 slices shown]
[im 22/64  brain]
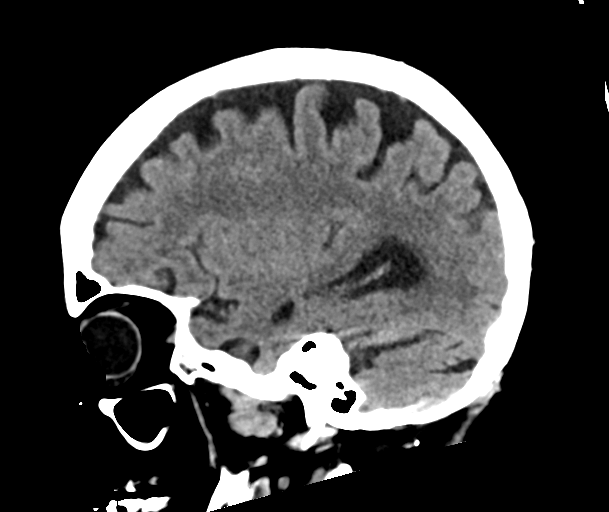
[im 32/64  brain]
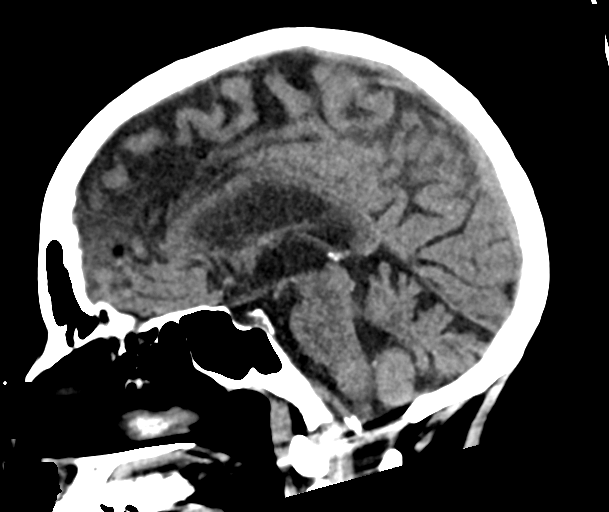
[im 43/64  brain]
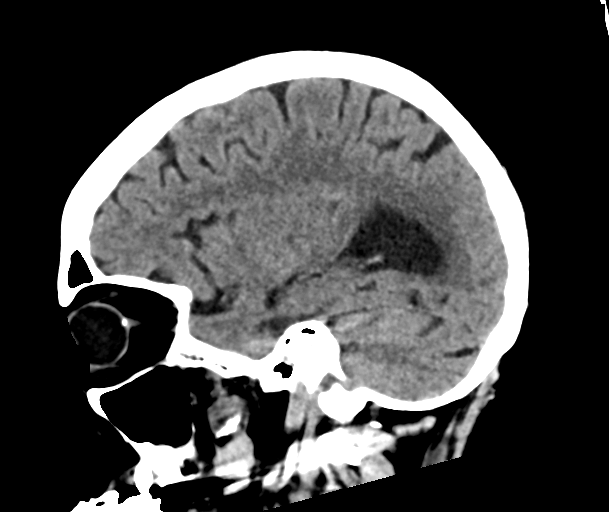

[17 of 47 positions shown; findings below may reference images not displayed]

FINDINGS: Brain: Generalized age related parenchymal volume loss with
commensurate dilatation of the ventricles and sulci. Chronic small
vessel ischemic changes within the bilateral periventricular white
matter regions.

No mass, hemorrhage, edema or other evidence of acute parenchymal
abnormality. No extra-axial hemorrhage.

Vascular: Chronic calcified atherosclerotic changes of the large
vessels at the skull base. No unexpected hyperdense vessel.

Skull: Normal. Negative for fracture or focal lesion.

Sinuses/Orbits: No acute finding.

Other: None.
IMPRESSION: 1. No acute findings. No intracranial mass, hemorrhage or edema. No
skull fracture.
2. Chronic small vessel ischemic changes in the white matter.

## 2021-03-30 IMAGING — DX DG CHEST 1V PORT
1 series · 1 of 1 positions shown · non-contrast
Comparison: 12/09/2019

CLINICAL DATA: Weakness.  Post fall.  [id number] positive.

EXAM:
PORTABLE CHEST 1 VIEW

[chest]
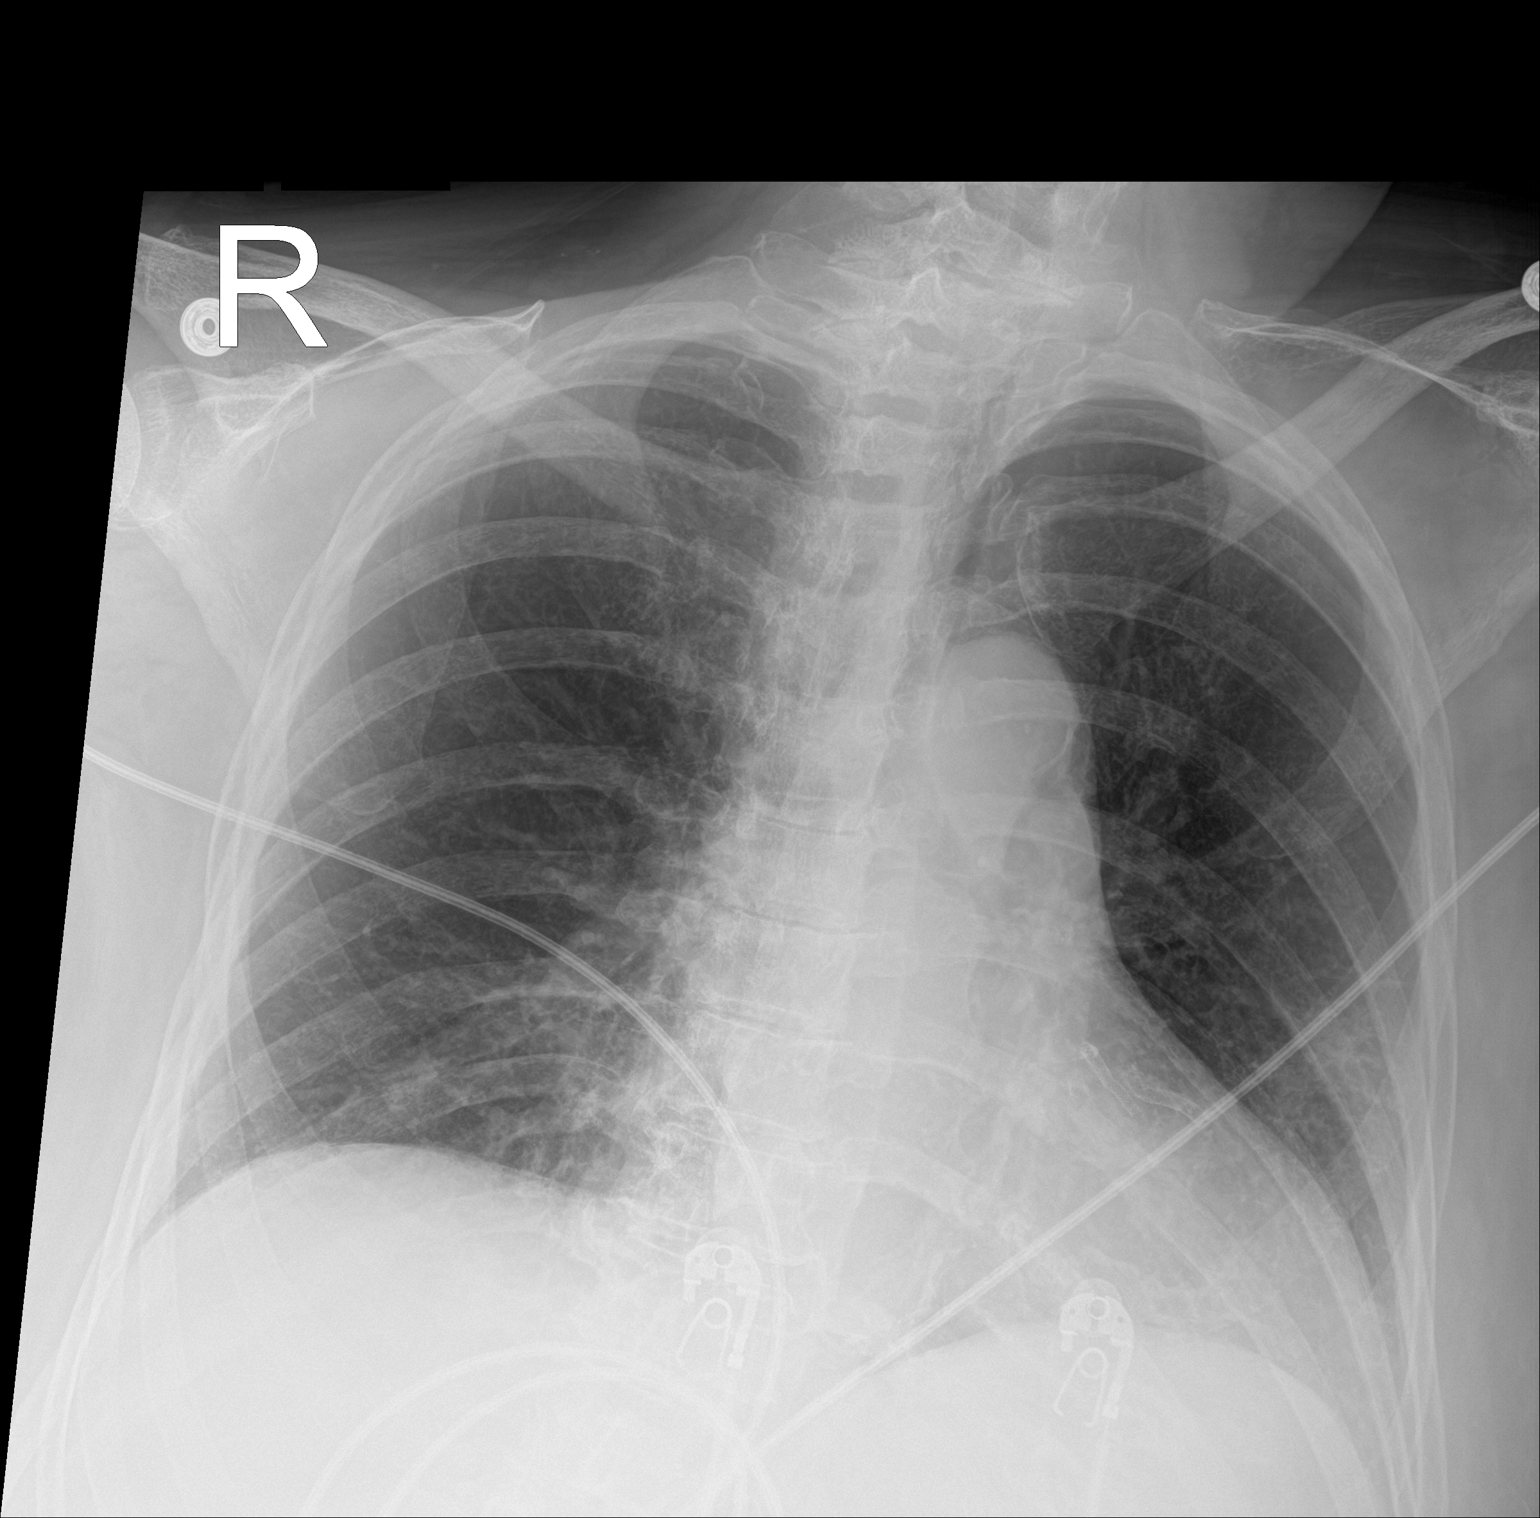

[1 of 1 positions shown; findings below may reference images not displayed]

FINDINGS: Patient is slightly rotated to the left. Lungs are adequately
inflated possible subtle hazy density lateral left mid lung. No
effusion. Cardiomediastinal silhouette and remainder of the exam is
unchanged.
IMPRESSION: Subtle hazy density lateral left mid lung which may be due to
atelectasis or infection.
